# Patient Record
Sex: Female | Born: 1995 | Race: White | Hispanic: No | Marital: Single | Smoking: Former smoker
Health system: Southern US, Community
[De-identification: ages and names within clinical notes are randomized; demographics above are authoritative.]

## PROBLEM LIST (undated history)

## (undated) ENCOUNTER — Inpatient Hospital Stay: Payer: Self-pay

## (undated) DIAGNOSIS — F4323 Adjustment disorder with mixed anxiety and depressed mood: Secondary | ICD-10-CM

## (undated) DIAGNOSIS — Z653 Problems related to other legal circumstances: Secondary | ICD-10-CM

## (undated) DIAGNOSIS — D649 Anemia, unspecified: Secondary | ICD-10-CM

## (undated) DIAGNOSIS — F902 Attention-deficit hyperactivity disorder, combined type: Secondary | ICD-10-CM

## (undated) DIAGNOSIS — F419 Anxiety disorder, unspecified: Secondary | ICD-10-CM

## (undated) DIAGNOSIS — J3089 Other allergic rhinitis: Secondary | ICD-10-CM

## (undated) HISTORY — DX: Adjustment disorder with mixed anxiety and depressed mood: F43.23

## (undated) HISTORY — PX: WISDOM TOOTH EXTRACTION: SHX21

## (undated) HISTORY — DX: Anxiety disorder, unspecified: F41.9

## (undated) HISTORY — DX: Attention-deficit hyperactivity disorder, combined type: F90.2

## (undated) HISTORY — DX: Problems related to other legal circumstances: Z65.3

---
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
## 2017-01-11 NOTE — L&D Delivery Note (Signed)
Delivery Note Primary OB: [facility] Delivery Provider: Marcelyn BruinsJacelyn Yago Ludvigsen, CNM Gestational Age: Full term Antepartum complications: anemia Intrapartum complications: PROM, Meconium   A viable Female was delivered via vertex presentation at 2158.  No nuchal cord was present. Delivery of the shoulders and body followed without difficulty. The infant was placed on the maternal abdomen.  The umbilical cord was doubly clamped and cut. Cord blood was collected. The placenta was delivered spontaneously and was inspected and found to be intact with a three vessel cord. The cervix and vagina were inspected. There were no lacerations. The fundus was firm.  All counts were correct. Baby to special care nursery for observation.  Apgars: 7, 8  Weight: 8 lb 11 oz.   Placenta status: spontaneous and intact. Cord: 3 vessels.  Anesthesia:  epidural Episiotomy:  none Lacerations:  none Suture Repair: none Est. Blood Loss (mL):  500  Mom to postpartum.  Baby to Nursery.  Marcelyn BruinsJacelyn Gerron Guidotti, CNM [facility], [facility] 08/27/2017  10:28 PM

## 2017-01-19 ENCOUNTER — Encounter: Payer: Self-pay | Admitting: Maternal Newborn

## 2017-02-02 ENCOUNTER — Encounter: Payer: Self-pay | Admitting: Obstetrics and Gynecology

## 2017-02-09 ENCOUNTER — Encounter: Payer: Self-pay | Admitting: Advanced Practice Midwife

## 2017-02-09 ENCOUNTER — Ambulatory Visit (INDEPENDENT_AMBULATORY_CARE_PROVIDER_SITE_OTHER): Payer: Medicaid Other | Admitting: Advanced Practice Midwife

## 2017-02-09 VITALS — BP 114/70 | Wt 121.0 lb

## 2017-02-09 DIAGNOSIS — Z3480 Encounter for supervision of other normal pregnancy, unspecified trimester: Secondary | ICD-10-CM

## 2017-02-09 DIAGNOSIS — Z113 Encounter for screening for infections with a predominantly sexual mode of transmission: Secondary | ICD-10-CM

## 2017-02-09 LAB — OB RESULTS CONSOLE VARICELLA ZOSTER ANTIBODY, IGG: Varicella: NON-IMMUNE/NOT IMMUNE

## 2017-02-09 NOTE — Progress Notes (Signed)
NOB today.  

## 2017-02-09 NOTE — Progress Notes (Signed)
New Obstetric Patient H&P    Chief Complaint: "Desires prenatal care"   History of Present Illness: Patient is a 22 y.o. G2P1001 Not Hispanic or Latino female, LMP 11/21/2016 presents with amenorrhea and positive home pregnancy test. Based on her LMP, her EDD is Estimated Date of Delivery: 08/28/2017. and her EGA is [redacted]w[redacted]d. Cycles are 5. days, regular, and occur approximately every : 28 days. She has never had a PAP smear due to age.    She had a urine pregnancy test which was positive 6 week(s)  ago. Her last menstrual period was normal and lasted for  5 day(s). Since her LMP she claims she has experienced breast tenderness, fatigue, nausea, vomiting. She denies vaginal bleeding. Her past medical history is noncontributory. Her prior pregnancies are notable for none  Since her LMP, she admits to the use of tobacco products  Yes: She quit when she found out She claims she has gained   8 pounds since the start of her pregnancy.  There are cats in the home in the home  no  She admits close contact with children on a regular basis  yes  She has had chicken pox in the past yes She has had Tuberculosis exposures, symptoms, or previously tested positive for TB   no Current or past history of domestic violence. no  Genetic Screening/Teratology Counseling: (Includes patient, baby's father, or anyone in either family with:)   1. Patient's age >/= 21 [facility]  no 2. Thalassemia ([place], [place], Mediterranean, or Asian background): MCV<80  no 3. Neural tube defect (meningomyelocele, spina bifida, anencephaly)  no 4. Congenital heart defect  no  5. Down syndrome  no 6. Tay-Sachs (Jewish, [place]  no 7. Canavan's Disease  no 8. Sickle cell disease or trait (African)  no  9. Hemophilia or other blood disorders  no  10. Muscular dystrophy  no  11. Cystic fibrosis  no  12. Huntington's Chorea  no  13. Mental retardation/autism  no 14. Other inherited genetic or chromosomal disorder  no 15.  Maternal metabolic disorder (DM, PKU, etc)  no 16. Patient or FOB with a child with a birth defect not listed above no  16a. Patient or FOB with a birth defect themselves no 17. Recurrent pregnancy loss, or stillbirth  no  18. Any medications since LMP other than prenatal vitamins (include vitamins, supplements, OTC meds, drugs, alcohol)  Flonase, Zyrtec, marijuana. She plans to quit using MJ 19. Any other genetic/environmental exposure to discuss  no  Infection History:   1. Lives with someone with TB or TB exposed  no  2. Patient or partner has history of genital herpes  no 3. Rash or viral illness since LMP  no 4. History of STI (GC, CT, HPV, syphilis, HIV)  no 5. History of recent travel :  no  Other pertinent information:  no     Review of Systems:10 point review of systems negative unless otherwise noted in HPI  Past Medical History:  Past Medical History:  Diagnosis Date  . Anemia   . Environmental and seasonal allergies     Past Surgical History:  Past Surgical History:  Procedure Laterality Date  . WISDOM TOOTH EXTRACTION     age 24 - all four    Gynecologic History: Patient's last menstrual period was 11/21/2016.  Obstetric History: G2P1001  Family History:  Family History  Problem Relation Age of Onset  . Hypertension Maternal Grandmother   . Breast cancer Other   .  Cancer Other 74       lung    Social History:  Social History   Socioeconomic History  . Marital status: Single    Spouse name: Not on file  . Number of children: 1  . Years of education: 27  . Highest education level: Not on file  Social Needs  . Financial resource strain: Not on file  . Food insecurity - worry: Not on file  . Food insecurity - inability: Not on file  . Transportation needs - medical: Not on file  . Transportation needs - non-medical: Not on file  Occupational History  . Occupation: UNEMPLOYED  Tobacco Use  . Smoking status: Former Smoker    Last attempt to  quit: 04/28/2015    Years since quitting: 1.7  . Smokeless tobacco: Never Used  . Tobacco comment: quit with positive pregnancy test  Substance and Sexual Activity  . Alcohol use: Yes    Alcohol/week: 3.6 oz    Types: 6 Shots of liquor per week    Comment: weekends  . Drug use: Yes    Types: Marijuana  . Sexual activity: Yes    Birth control/protection: None  Other Topics Concern  . Not on file  Social History Narrative  . Not on file    Allergies:  Allergies  Allergen Reactions  . Vicodin [Hydrocodone-Acetaminophen] Itching    Medications: Prior to Admission medications   Medication Sig Start Date End Date Taking? Authorizing Provider  fluticasone (FLONASE) 50 MCG/ACT nasal spray Place into the nose. 11/27/14  Yes [provider]  Prenatal Vit-Fe Fumarate-FA (MULTIVITAMIN-PRENATAL) 27-0.8 MG TABS tablet Take 1 tablet by mouth daily at 12 noon.   Yes [provider]  calcium carbonate (TUMS - DOSED IN MG ELEMENTAL CALCIUM) 500 MG chewable tablet Chew 1 tablet by mouth 3 (three) times daily with meals.    [provider]  cetirizine (ZYRTEC) 10 MG tablet Take 10 mg by mouth daily.    [provider]    Physical Exam Vitals: Blood pressure 114/70, weight 121 lb (54.9 kg), last menstrual period 11/21/2016, not currently breastfeeding.  General: NAD HEENT: normocephalic, anicteric Thyroid: no enlargement, no palpable nodules Pulmonary: No increased work of breathing, CTAB Cardiovascular: RRR, distal pulses 2+ Abdomen: NABS, soft, non-tender, non-distended.  Umbilicus without lesions.  No hepatomegaly, splenomegaly or masses palpable. No evidence of hernia, Fetal Heart Tones heard: 170's Genitourinary:  Shared decision making: pelvic exam deferred for no complaints or symptoms, no PAP Extremities: no edema, erythema, or tenderness Neurologic: Grossly intact Psychiatric: mood appropriate, affect full   Assessment: 22 y.o. G2P1001 at [redacted]w[redacted]d  by LMP presenting to initiate prenatal care  Plan: 1) Avoid alcoholic beverages. 2) Patient encouraged not to smoke.  3) Discontinue the use of all non-medicinal drugs and chemicals/marijuana.  4) Take prenatal vitamins daily.  5) Nutrition, food safety (fish, cheese advisories, and high nitrite foods) and exercise discussed. 6) Hospital and practice style discussed with cross coverage system.  7) Genetic Screening, such as with 1st Trimester Screening, cell free fetal DNA, AFP testing, and Ultrasound, as well as with amniocentesis and CVS as appropriate, is discussed with patient. At the conclusion of today's visit patient declined genetic testing 8) Patient is asked about travel to areas at risk for the [place] virus, and counseled to avoid travel and exposure to mosquitoes or sexual partners who may have themselves been exposed to the virus. Testing is discussed, and will be ordered as appropriate.  9) Dating scan  in 1 week  Tresea MallJane Riku Buttery, CNM

## 2017-02-09 NOTE — Patient Instructions (Signed)
Prenatal Care WHAT IS PRENATAL CARE? Prenatal care is the process of caring for a pregnant woman before she gives birth. Prenatal care makes sure that she and her baby remain as healthy as possible throughout pregnancy. Prenatal care may be provided by a midwife, family practice health care provider, or a childbirth and pregnancy specialist (obstetrician). Prenatal care may include physical examinations, testing, treatments, and education on nutrition, lifestyle, and social support services. WHY IS PRENATAL CARE SO IMPORTANT? Early and consistent prenatal care increases the chance that you and your baby will remain healthy throughout your pregnancy. This type of care also decreases a baby's risk of being born too early (prematurely), or being born smaller than expected (small for gestational age). Any underlying medical conditions you may have that could pose a risk during your pregnancy are discussed during prenatal care visits. You will also be monitored regularly for any new conditions that may arise during your pregnancy so they can be treated quickly and effectively. WHAT HAPPENS DURING PRENATAL CARE VISITS? Prenatal care visits may include the following: Discussion Tell your health care provider about any new signs or symptoms you have experienced since your last visit. These might include:  Nausea or vomiting.  Increased or decreased level of energy.  Difficulty sleeping.  Back or leg pain.  Weight changes.  Frequent urination.  Shortness of breath with physical activity.  Changes in your skin, such as the development of a rash or itchiness.  Vaginal discharge or bleeding.  Feelings of excitement or nervousness.  Changes in your baby's movements.  You may want to write down any questions or topics you want to discuss with your health care provider and bring them with you to your appointment. Examination During your first prenatal care visit, you will likely have a complete  physical exam. Your health care provider will often examine your vagina, cervix, and the position of your uterus, as well as check your heart, lungs, and other body systems. As your pregnancy progresses, your health care provider will measure the size of your uterus and your baby's position inside your uterus. He or she may also examine you for early signs of labor. Your prenatal visits may also include checking your blood pressure and, after about 10-12 weeks of pregnancy, listening to your baby's heartbeat. Testing Regular testing often includes:  Urinalysis. This checks your urine for glucose, protein, or signs of infection.  Blood count. This checks the levels of white and red blood cells in your body.  Tests for sexually transmitted infections (STIs). Testing for STIs at the beginning of pregnancy is routinely done and is required in many states.  Antibody testing. You will be checked to see if you are immune to certain illnesses, such as rubella, that can affect a developing fetus.  Glucose screen. Around 24-28 weeks of pregnancy, your blood glucose level will be checked for signs of gestational diabetes. Follow-up tests may be recommended.  Group B strep. This is a bacteria that is commonly found inside a woman's vagina. This test will inform your health care provider if you need an antibiotic to reduce the amount of this bacteria in your body prior to labor and childbirth.  Ultrasound. Many pregnant women undergo an ultrasound screening around 18-20 weeks of pregnancy to evaluate the health of the fetus and check for any developmental abnormalities.  HIV (human immunodeficiency virus) testing. Early in your pregnancy, you will be screened for HIV. If you are at high risk for HIV, this test may   be repeated during your third trimester of pregnancy.  You may be offered other testing based on your age, personal or family medical history, or other factors. HOW OFTEN SHOULD I PLAN TO SEE MY  HEALTH CARE PROVIDER FOR PRENATAL CARE? Your prenatal care check-up schedule depends on any medical conditions you have before, or develop during, your pregnancy. If you do not have any underlying medical conditions, you will likely be seen for checkups:  Monthly, during the first 6 months of pregnancy.  Twice a month during months 7 and 8 of pregnancy.  Weekly starting in the 9th month of pregnancy and until delivery.  If you develop signs of early labor or other concerning signs or symptoms, you may need to see your health care provider more often. Ask your health care provider what prenatal care schedule is best for you. WHAT CAN I DO TO KEEP MYSELF AND MY BABY AS HEALTHY AS POSSIBLE DURING MY PREGNANCY?  Take a prenatal vitamin containing 400 micrograms (0.4 mg) of folic acid every day. Your health care provider may also ask you to take additional vitamins such as iodine, vitamin D, iron, copper, and zinc.  Take 1500-2000 mg of calcium daily starting at your 20th week of pregnancy until you deliver your baby.  Make sure you are up to date on your vaccinations. Unless directed otherwise by your health care provider: ? You should receive a tetanus, diphtheria, and pertussis (Tdap) vaccination between the 27th and 36th week of your pregnancy, regardless of when your last Tdap immunization occurred. This helps protect your baby from whooping cough (pertussis) after he or she is born. ? You should receive an annual inactivated influenza vaccine (IIV) to help protect you and your baby from influenza. This can be done at any point during your pregnancy.  Eat a well-rounded diet that includes: ? Fresh fruits and vegetables. ? Lean proteins. ? Calcium-rich foods such as milk, yogurt, hard cheeses, and dark, leafy greens. ? Whole grain breads.  Do noteat seafood high in mercury, including: ? Swordfish. ? Tilefish. ? Shark. ? King mackerel. ? More than 6 oz tuna per week.  Do not  eat: ? Raw or undercooked meats or eggs. ? Unpasteurized foods, such as soft cheeses (brie, blue, or feta), juices, and milks. ? Lunch meats. ? Hot dogs that have not been heated until they are steaming.  Drink enough water to keep your urine clear or pale yellow. For many women, this may be 10 or more 8 oz glasses of water each day. Keeping yourself hydrated helps deliver nutrients to your baby and may prevent the start of pre-term uterine contractions.  Do not use any tobacco products including cigarettes, chewing tobacco, or electronic cigarettes. If you need help quitting, ask your health care provider.  Do not drink beverages containing alcohol. No safe level of alcohol consumption during pregnancy has been determined.  Do not use any illegal drugs. These can harm your developing baby or cause a miscarriage.  Ask your health care provider or pharmacist before taking any prescription or over-the-counter medicines, herbs, or supplements.  Limit your caffeine intake to no more than 200 mg per day.  Exercise. Unless told otherwise by your health care provider, try to get 30 minutes of moderate exercise most days of the week. Do not  do high-impact activities, contact sports, or activities with a high risk of falling, such as horseback riding or downhill skiing.  Get plenty of rest.  Avoid anything that raises your   body temperature, such as hot tubs and saunas.  If you own a cat, do not empty its litter box. Bacteria contained in cat feces can cause an infection called toxoplasmosis. This can result in serious harm to the fetus.  Stay away from chemicals such as insecticides, lead, mercury, and cleaning or paint products that contain solvents.  Do not have any X-rays taken unless medically necessary.  Take a childbirth and breastfeeding preparation class. Ask your health care provider if you need a referral or recommendation.  This information is not intended to replace advice given  to you by your health care provider. Make sure you discuss any questions you have with your health care provider. Document Released: 12/31/2002 Document Revised: 06/02/2015 Document Reviewed: 03/14/2013 Elsevier Interactive Patient Education  2017 [employer] Exercise During Pregnancy For people of all ages, exercise is an important part of being healthy. Exercise improves heart and lung function and helps to maintain strength, flexibility, and a healthy body weight. Exercise also boosts energy levels and elevates mood. For most women, maintaining an exercise routine throughout pregnancy is recommended. It is only on rare occasions and with certain medical conditions or pregnancy complications that women may be asked to limit or avoid exercise during pregnancy. What are some other benefits to exercising during pregnancy? Along with maintaining strength and flexibility, exercising throughout pregnancy can help to:  Keep strength in muscles that are very important during labor and childbirth.  Decrease low back pain during pregnancy.  Decrease the risk of developing gestational diabetes mellitus (GDM).  Improve blood sugar (glucose) control for women who have GDM.  Decrease the risk of developing preeclampsia. This is a serious condition that causes high blood pressure along with other symptoms, such as swelling and headaches.  Decrease the risk of cesarean delivery.  Speed up the recovery after giving birth.  How often should I exercise? Unless your health care provider gives you different instructions, you should try to exercise on most days or all days of the week. In general, try to exercise with moderate intensity for about 150 minutes per week. This can be spread out across several days, such as exercising for 30 minutes per day on 5 days of each week. You can tell that you are exercising at a moderate intensity if you have a higher heart rate and faster breathing, but you are still  able to hold a conversation. What types of moderate-intensity exercise are recommended during pregnancy? There are many types of exercise that are safe for you to do during pregnancy. Unless your health care provider gives you different instructions, do a variety of exercises that safely increase your heart and breathing (cardiopulmonary) rates and help you to build and maintain muscle strength (strength training). You should always be able to talk in full sentences while exercising during pregnancy. Some examples of exercising that is safe to do during pregnancy include:  Brisk walking or hiking.  Swimming.  Water aerobics.  Riding a stationary bike.  Strength training.  Modified yoga or Pilates. Tell your instructor that you are pregnant. Avoid overstretching and avoid lying on your back for long periods of time.  Running or jogging. Only choose this type of exercise if: ? You ran or jogged regularly before your pregnancy. ? You can run or jog and still talk in complete sentences.  What types of exercise should I not do during pregnancy? Depending on your level of fitness and whether you exercised regularly before your pregnancy, you may be   advised to limit vigorous-intensity exercise during your pregnancy. You can tell that you are exercising at a vigorous intensity if you are breathing much harder and faster and cannot hold a conversation while exercising. Some examples of exercising that you should avoid during pregnancy include:  Contact sports.  Activities that place you at risk for falling on or being hit in the belly, such as downhill skiing, water skiing, surfing, rock climbing, cycling, gymnastics, and horseback riding.  Scuba diving.  Sky diving.  Yoga or Pilates in a room that is heated to extreme temperatures ("hot yoga" or "hot Pilates").  Jogging or running, unless you ran or jogged regularly before your pregnancy. While jogging or running, you should always be able  to talk in full sentences. Do not run or jog so vigorously that you are unable to have a conversation.  If you are not used to exercising at elevation (more than 6,000 feet above sea level), do not do so during your pregnancy.  When should I avoid exercising during pregnancy? Certain medical conditions can make it unsafe to exercise during pregnancy, or they may increase your risk of miscarriage or early labor and birth. Some of these conditions include:  Some types of heart disease.  Some types of lung disease.  Placenta previa. This is when the placenta partially or completely covers the opening of the uterus (cervix).  Frequent bleeding from the vagina during your pregnancy.  Incompetent cervix. This is when your cervix does not remain as tightly closed during pregnancy as it should.  Premature labor.  Ruptured membranes. This is when the protective sac (amniotic sac) opens up and amniotic fluid leaks from your vagina.  Severely low blood count (anemia).  Preeclampsia or pregnancy-caused high blood pressure.  Carrying more than one baby (multiple gestation) and having an additional risk of early labor.  Poorly controlled diabetes.  Being severely underweight or severely overweight.  Intrauterine growth restriction. This is when your baby's growth and development during pregnancy are slower than expected.  Other medical conditions. Ask your health care provider if any apply to you.  What else should I know about exercising during pregnancy? You should take these precautions while exercising during pregnancy:  Avoid overheating. ? Wear loose-fitting, breathable clothes. ? Do not exercise in very high temperatures.  Avoid dehydration. Drink enough water before, during, and after exercise to keep your urine clear or pale yellow.  Avoid overstretching. Because of hormone changes during pregnancy, it is easy to overstretch muscles, tendons, and ligaments during  pregnancy.  Start slowly and ask your health care provider to recommend types of exercise that are safe for you, if exercising regularly is new for you.  Pregnancy is not a time for exercising to lose weight. When should I seek medical care? You should stop exercising and call your health care provider if you have any unusual symptoms, such as:  Mild uterine contractions or abdominal cramping.  Dizziness that does not improve with rest.  When should I seek immediate medical care? You should stop exercising and call your local emergency services (911 in the [place] if you have any unusual symptoms, such as:  Sudden, severe pain in your low back or your belly.  Uterine contractions or abdominal cramping that do not improve with rest.  Chest pain.  Bleeding or fluid leaking from your vagina.  Shortness of breath.  This information is not intended to replace advice given to you by your health care provider. Make sure you discuss any   questions you have with your health care provider. Document Released: 12/28/2004 Document Revised: 05/28/2015 Document Reviewed: 03/07/2014 Elsevier Interactive Patient Education  2018 [employer] Eating Plan for Pregnant Women While you are pregnant, your body will require additional nutrition to help support your growing baby. It is recommended that you consume:  150 additional calories each day during your first trimester.  300 additional calories each day during your second trimester.  300 additional calories each day during your third trimester.  Eating a healthy, well-balanced diet is very important for your health and for your baby's health. You also have a higher need for some vitamins and minerals, such as folic acid, calcium, iron, and vitamin D. What do I need to know about eating during pregnancy?  Do not try to lose weight or go on a diet during pregnancy.  Choose healthy, nutritious foods. Choose  of a sandwich with a glass of milk  instead of a candy bar or a high-calorie sugar-sweetened beverage.  Limit your overall intake of foods that have "empty calories." These are foods that have little nutritional value, such as sweets, desserts, candies, sugar-sweetened beverages, and fried foods.  Eat a variety of foods, especially fruits and vegetables.  Take a prenatal vitamin to help meet the additional needs during pregnancy, specifically for folic acid, iron, calcium, and vitamin D.  Remember to stay active. Ask your health care provider for exercise recommendations that are specific to you.  Practice good food safety and cleanliness, such as washing your hands before you eat and after you prepare raw meat. This helps to prevent foodborne illnesses, such as listeriosis, that can be very dangerous for your baby. Ask your health care provider for more information about listeriosis. What does 150 extra calories look like? Healthy options for an additional 150 calories each day could be any of the following:  Plain low-fat yogurt (6-8 oz) with  cup of berries.  1 apple with 2 teaspoons of peanut butter.  Cut-up vegetables with  cup of hummus.  Low-fat chocolate milk (8 oz or 1 cup).  1 string cheese with 1 medium orange.   of a peanut butter and jelly sandwich on whole-wheat bread (1 tsp of peanut butter).  For 300 calories, you could eat two of those healthy options each day. What is a healthy amount of weight to gain? The recommended amount of weight for you to gain is based on your pre-pregnancy BMI. If your pre-pregnancy BMI was:  Less than 18 (underweight), you should gain 28-40 lb.  18-24.9 (normal), you should gain 25-35 lb.  25-29.9 (overweight), you should gain 15-25 lb.  Greater than 30 (obese), you should gain 11-20 lb.  What if I am having twins or multiples? Generally, pregnant women who will be having twins or multiples may need to increase their daily calories by 300-600 calories each day. The  recommended range for total weight gain is 25-54 lb, depending on your pre-pregnancy BMI. Talk with your health care provider for specific guidance about additional nutritional needs, weight gain, and exercise during your pregnancy. What foods can I eat? Grains Any grains. Try to choose whole grains, such as whole-wheat bread, oatmeal, or brown rice. Vegetables Any vegetables. Try to eat a variety of colors and types of vegetables to get a full range of vitamins and minerals. Remember to wash your vegetables well before eating. Fruits Any fruits. Try to eat a variety of colors and types of fruit to get a full range of vitamins and   minerals. Remember to wash your fruits well before eating. Meats and Other Protein Sources Lean meats, including chicken, turkey, fish, and lean cuts of beef, veal, or pork. Make sure that all meats are cooked to "well done." Tofu. Tempeh. Beans. Eggs. Peanut butter and other nut butters. Seafood, such as shrimp, crab, and lobster. If you choose fish, select types that are higher in omega-3 fatty acids, including salmon, herring, mussels, trout, sardines, and pollock. Make sure that all meats are cooked to food-safe temperatures. Dairy Pasteurized milk and milk alternatives. Pasteurized yogurt and pasteurized cheese. Cottage cheese. Sour cream. Beverages Water. Juices that contain 100% fruit juice or vegetable juice. Caffeine-free teas and decaffeinated coffee. Drinks that contain caffeine are okay to drink, but it is better to avoid caffeine. Keep your total caffeine intake to less than 200 mg each day (12 oz of coffee, tea, or soda) or as directed by your health care provider. Condiments Any pasteurized condiments. Sweets and Desserts Any sweets and desserts. Fats and Oils Any fats and oils. The items listed above may not be a complete list of recommended foods or beverages. Contact your dietitian for more options. What foods are not  recommended? Vegetables Unpasteurized (raw) vegetable juices. Fruits Unpasteurized (raw) fruit juices. Meats and Other Protein Sources Cured meats that have nitrates, such as bacon, salami, and hotdogs. Luncheon meats, bologna, or other deli meats (unless they are reheated until they are steaming hot). Refrigerated pate, meat spreads from a meat counter, smoked seafood that is found in the refrigerated section of a store. Raw fish, such as sushi or sashimi. High mercury content fish, such as tilefish, shark, swordfish, and king mackerel. Raw meats, such as tuna or beef tartare. Undercooked meats and poultry. Make sure that all meats are cooked to food-safe temperatures. Dairy Unpasteurized (raw) milk and any foods that have raw milk in them. Soft cheeses, such as feta, queso blanco, queso fresco, Brie, Camembert cheeses, blue-veined cheeses, and Panela cheese (unless it is made with pasteurized milk, which must be stated on the label). Beverages Alcohol. Sugar-sweetened beverages, such as sodas, teas, or energy drinks. Condiments Homemade fermented foods and drinks, such as pickles, sauerkraut, or kombucha drinks. (Store-bought pasteurized versions of these are okay.) Other Salads that are made in the store, such as ham salad, chicken salad, egg salad, tuna salad, and seafood salad. The items listed above may not be a complete list of foods and beverages to avoid. Contact your dietitian for more information. This information is not intended to replace advice given to you by your health care provider. Make sure you discuss any questions you have with your health care provider. Document Released: 10/12/2013 Document Revised: 06/05/2015 Document Reviewed: 06/12/2013 Elsevier Interactive Patient Education  2018 [employer]  

## 2017-02-11 LAB — RPR+RH+ABO+RUB AB+AB SCR+CB...
Antibody Screen: NEGATIVE
HEMOGLOBIN: 11 g/dL — AB (ref 11.1–15.9)
HEP B S AG: NEGATIVE
HIV SCREEN 4TH GENERATION: NONREACTIVE
Hematocrit: 32.6 % — ABNORMAL LOW (ref 34.0–46.6)
MCH: 29.6 pg (ref 26.6–33.0)
MCHC: 33.7 g/dL (ref 31.5–35.7)
MCV: 88 fL (ref 79–97)
Platelets: 266 10*3/uL (ref 150–379)
RBC: 3.72 x10E6/uL — AB (ref 3.77–5.28)
RDW: 13.2 % (ref 12.3–15.4)
RPR Ser Ql: NONREACTIVE
RUBELLA: 0.95 {index} — AB (ref >0.99)
Rh Factor: NEGATIVE
WBC: 13.8 10*3/uL — ABNORMAL HIGH (ref 3.4–10.8)

## 2017-02-11 LAB — CHLAMYDIA/GONOCOCCUS/TRICHOMONAS, NAA
Chlamydia by NAA: NEGATIVE
Gonococcus by NAA: NEGATIVE
TRICH VAG BY NAA: NEGATIVE

## 2017-02-11 LAB — URINE CULTURE

## 2017-02-14 LAB — URINE DRUG PANEL 7
AMPHETAMINES, URINE: NEGATIVE ng/mL
BARBITURATE QUANT UR: NEGATIVE ng/mL
Benzodiazepine Quant, Ur: NEGATIVE ng/mL
CANNABINOID QUANT UR: POSITIVE — AB
COCAINE (METAB.): NEGATIVE ng/mL
Opiate Quant, Ur: NEGATIVE ng/mL
PCP Quant, Ur: NEGATIVE ng/mL

## 2017-02-17 ENCOUNTER — Ambulatory Visit (INDEPENDENT_AMBULATORY_CARE_PROVIDER_SITE_OTHER): Payer: Medicaid Other

## 2017-02-17 ENCOUNTER — Encounter: Payer: Self-pay | Admitting: Maternal Newborn

## 2017-02-17 ENCOUNTER — Ambulatory Visit (INDEPENDENT_AMBULATORY_CARE_PROVIDER_SITE_OTHER): Payer: Medicaid Other | Admitting: Maternal Newborn

## 2017-02-17 VITALS — BP 90/60 | Wt 116.0 lb

## 2017-02-17 DIAGNOSIS — Z3480 Encounter for supervision of other normal pregnancy, unspecified trimester: Secondary | ICD-10-CM

## 2017-02-17 DIAGNOSIS — Z3A12 12 weeks gestation of pregnancy: Secondary | ICD-10-CM

## 2017-02-17 NOTE — Progress Notes (Signed)
    Routine Prenatal Care Visit  Subjective  Jennifer [name] is a 22 y.o. G2P1001 at 4846w4d being seen today for ongoing prenatal care.  She is currently monitored for the following issues for this low-risk pregnancy and has Supervision of other normal pregnancy, antepartum; ADHD (attention deficit hyperactivity disorder); and Allergic rhinitis on their problem list.  ----------------------------------------------------------------------------------- Patient reports no complaints.   Vag. Bleeding: None.   ----------------------------------------------------------------------------------- The following portions of the patient's history were reviewed and updated as appropriate: allergies, current medications, past family history, past medical history, past social history, past surgical history and problem list. Problem list updated.   Objective  Blood pressure 90/60, weight 116 lb (52.6 kg), last menstrual period 11/21/2016, not currently breastfeeding. Pregravid weight 113 lb (51.3 kg) Total Weight Gain 3 lb (1.361 kg) Urinalysis: Urine Protein: Negative Urine Glucose: Negative  Fetal Status: Fetal Heart Rate (bpm): 157         General:  Alert, oriented and cooperative. Patient is in no acute distress.  Skin: Skin is warm and dry. No rash noted.   Cardiovascular: Normal heart rate noted  Respiratory: Normal respiratory effort, no problems with respiration noted  Abdomen: Soft, gravid, appropriate for gestational age. Pain/Pressure: Absent     Pelvic:  Cervical exam deferred        Extremities: Normal range of motion.     Mental Status: Normal mood and affect. Normal behavior. Normal judgment and thought content.     Assessment   22 y.o. G2P1001 at 4946w4d, EDD 08/28/2017 by Last Menstrual Period presenting for routine prenatal visit.  Plan   pregnancy Problems (from 02/09/17 to present)    Problem Noted Resolved   Supervision of other normal pregnancy, antepartum 02/09/2017 by  Tresea MallGledhill, Jennifer, CNM No   Overview Addendum 02/09/2017 12:57 PM by Tresea MallGledhill, Jennifer, CNM    [facility] Prenatal Labs  Dating  Blood type:     Genetic Screen 1 Screen:    AFP:     Quad:     NIPS: Antibody:   Anatomic US  Rubella:   Varicella: @VZVIGG @  GTT Early:               Third trimester:  RPR:     Rhogam  HBsAg:     TDaP vaccine                       Flu Shot: HIV:     Baby Food                                GBS:   Contraception  Pap:  CBB     CS/VBAC NA   Support Person [name]            Ultrasound today shows single IUP with cardiac activity, GA within 5 days of EDD by LMP, no adjustment in EDD.    Preterm labor symptoms and general obstetric precautions including but not limited to vaginal bleeding, contractions, and leaking of fluid were reviewed in detail with the patient.  Samples given of prenatal vitamins as she is almost out, will Rx but advised her that she can also use OTC vitamins as long as they contain folate and iron.  Return in about 4 weeks (around 03/17/2017) for ROB.  Jennifer [name], CNM 02/17/2017  3:30 PM

## 2017-02-17 NOTE — Progress Notes (Signed)
C/o test results from last visit, rx [email or web address]

## 2017-02-18 MED ORDER — ONE-A-DAY WOMENS PRENATAL 1 28-0.8-235 MG PO CAPS: 1.0000 | ORAL_CAPSULE | Freq: Every day | ORAL | 6 refills | Status: DC

## 2017-03-10 ENCOUNTER — Telehealth: Payer: Self-pay | Admitting: Obstetrics and Gynecology

## 2017-03-10 NOTE — Telephone Encounter (Signed)
If this patient's bleeding was more than just spotting on tissue she needs to be seen due to she is RH negative. Please check to see if [place] BingJaci can see her this afternoon. Thank you.

## 2017-03-10 NOTE — Telephone Encounter (Signed)
Called and spoke with Patient to follow up on bleeding complaint. Pt reports no bleeding at this time since her last episode. Per JCS to have patient to come in to be seen if patient feels the needs to be seen or schedule for Nurse visit for Rhogam  Injection. Pt was advised of scheduling appointment chooses to come in for injection advise by provider. Patient also advise to call back to be seen if her symptoms become worse.

## 2017-03-10 NOTE — Telephone Encounter (Signed)
Pt is [redacted] weeks pregnant and is having bright red bleeding when she went to restroom. Pt is stating she isn't having bleeding at this time. Please advise overbooking

## 2017-03-11 ENCOUNTER — Ambulatory Visit (INDEPENDENT_AMBULATORY_CARE_PROVIDER_SITE_OTHER): Payer: Medicaid Other

## 2017-03-11 DIAGNOSIS — O4692 Antepartum hemorrhage, unspecified, second trimester: Secondary | ICD-10-CM

## 2017-03-11 MED ORDER — RHO D IMMUNE GLOBULIN 1500 UNIT/2ML IJ SOSY
300.0000 ug | PREFILLED_SYRINGE | Freq: Once | INTRAMUSCULAR | Status: AC
  Administered 2017-03-11: 300 ug via INTRAMUSCULAR

## 2017-03-11 NOTE — Progress Notes (Signed)
Pt here for rhophylac inj which was given IM right glut.  NDC# [id number]

## 2017-03-16 ENCOUNTER — Encounter: Payer: Medicaid Other | Admitting: Certified Nurse Midwife

## 2017-03-18 ENCOUNTER — Ambulatory Visit (INDEPENDENT_AMBULATORY_CARE_PROVIDER_SITE_OTHER): Payer: Medicaid Other | Admitting: Certified Nurse Midwife

## 2017-03-18 ENCOUNTER — Encounter: Payer: Self-pay | Admitting: Certified Nurse Midwife

## 2017-03-18 VITALS — BP 90/60 | Wt 120.0 lb

## 2017-03-18 DIAGNOSIS — F1290 Cannabis use, unspecified, uncomplicated: Secondary | ICD-10-CM | POA: Insufficient documentation

## 2017-03-18 DIAGNOSIS — Z3480 Encounter for supervision of other normal pregnancy, unspecified trimester: Secondary | ICD-10-CM

## 2017-03-18 DIAGNOSIS — Z3A16 16 weeks gestation of pregnancy: Secondary | ICD-10-CM

## 2017-03-18 NOTE — Progress Notes (Signed)
Pt c/o bad headaches almost every day, waking up daily with headaches behind her eyes. Also c/o seasonal allergies and congestion

## 2017-03-18 NOTE — Progress Notes (Signed)
ROB at 16wk5d: Jennifer BabinskiMarilyn in to see patient regarding a class she needs to attend regarding drug abuse (charged with a misdemeanor for MJ) FOB not very supportive, causing more stress. EPDS score=10. Recommended returning for counseling with Jennifer FolksAmanda at the [facility]. Jennifer RossettiSaw Jennifer [name] with her last pregnancy regarding grieving after the death of the FOB. Feeling some flutters. FH at U-1FB. FHTs WNL Offered quad screen-she is undecided. Advised can have done between 15-21 weeks. Can call if she decided to have it done ROB in 2-3 weeks for anatomy scan

## 2017-04-08 ENCOUNTER — Encounter: Payer: Medicaid Other | Admitting: Obstetrics and Gynecology

## 2017-04-08 ENCOUNTER — Encounter: Payer: Self-pay | Admitting: Obstetrics and Gynecology

## 2017-04-08 ENCOUNTER — Ambulatory Visit (INDEPENDENT_AMBULATORY_CARE_PROVIDER_SITE_OTHER): Payer: Medicaid Other | Admitting: Obstetrics and Gynecology

## 2017-04-08 ENCOUNTER — Ambulatory Visit (INDEPENDENT_AMBULATORY_CARE_PROVIDER_SITE_OTHER): Payer: Medicaid Other

## 2017-04-08 ENCOUNTER — Other Ambulatory Visit: Payer: Medicaid Other

## 2017-04-08 VITALS — BP 102/64 | Wt 126.0 lb

## 2017-04-08 DIAGNOSIS — Z3480 Encounter for supervision of other normal pregnancy, unspecified trimester: Secondary | ICD-10-CM

## 2017-04-08 DIAGNOSIS — F1290 Cannabis use, unspecified, uncomplicated: Secondary | ICD-10-CM

## 2017-04-08 DIAGNOSIS — F909 Attention-deficit hyperactivity disorder, unspecified type: Secondary | ICD-10-CM

## 2017-04-08 DIAGNOSIS — Z3A19 19 weeks gestation of pregnancy: Secondary | ICD-10-CM

## 2017-04-08 NOTE — Progress Notes (Incomplete)
  Routine Prenatal Care Visit  Subjective  Jennifer [name] is a 22 y.o. G2P1001 at 4653w5d being seen today for ongoing prenatal care.  She is currently monitored for the following issues for this {Blank single:19197::"high-risk","low-risk"} pregnancy and has Supervision of other normal pregnancy, antepartum; ADHD (attention deficit hyperactivity disorder); Allergic rhinitis; and Marijuana use on their problem list.  ----------------------------------------------------------------------------------- Patient reports {sx:14538}.   Contractions: Not present. Vag. Bleeding: None.  Movement: Present. Denies leaking of fluid.  ----------------------------------------------------------------------------------- The following portions of the patient's history were reviewed and updated as appropriate: allergies, current medications, past family history, past medical history, past social history, past surgical history and problem list. Problem list updated.   Objective  Blood pressure 102/64, weight 126 lb (57.2 kg), last menstrual period 11/21/2016, not currently breastfeeding. Pregravid weight 113 lb (51.3 kg) Total Weight Gain 13 lb (5.897 kg) Urinalysis: Urine Protein: Negative Urine Glucose: Negative  Fetal Status: Fetal Heart Rate (bpm): Present   Movement: Present     General:  Alert, oriented and cooperative. Patient is in no acute distress.  Skin: Skin is warm and dry. No rash noted.   Cardiovascular: Normal heart rate noted  Respiratory: Normal respiratory effort, no problems with respiration noted  Abdomen: Soft, gravid, appropriate for gestational age. Pain/Pressure: Absent     Pelvic:  {Blank single:19197::"Cervical exam performed","Cervical exam deferred"}        Extremities: Normal range of motion.     Mental Status: Normal mood and affect. Normal behavior. Normal judgment and thought content.   Assessment   22 y.o. G2P1001 at 10453w5d by  08/28/2017, by Last Menstrual Period presenting  for {Blank single:19197::"routine","work-in"} prenatal visit  Plan   pregnancy Problems (from 02/09/17 to present)    Problem Noted Resolved   Supervision of other normal pregnancy, antepartum 02/09/2017 by Tresea MallGledhill, Jane, CNM No   Overview Addendum 02/17/2017  3:30 PM by Oswaldo ConroySchmid, Jacelyn Y, CNM    Clinic [facility] Prenatal Labs  Dating  Blood type: B/Negative/-- (01/30 1131)   Genetic Screen 1 Screen:    AFP:     Quad:     NIPS: Antibody:Negative (01/30 1131)  Anatomic US  Rubella: 0.95 (01/30 1131) Varicella: Non-immune  GTT Early:               Third trimester:  RPR: Non Reactive (01/30 1131)   Rhogam  HBsAg: Negative (01/30 1131)   TDaP vaccine                       Flu Shot: HIV: Non Reactive (01/30 1131)   Baby Food                                GBS:   Contraception  Pap:  CBB     CS/VBAC NA   Support Person Boyfriend [name]               {Blank single:19197::"Term","Preterm"} labor symptoms and general obstetric precautions including but not limited to vaginal bleeding, contractions, leaking of fluid and fetal movement were reviewed in detail with the patient. Please refer to After Visit Summary for other counseling recommendations.   Return in about 1 month (around 05/06/2017) for Routine Prenatal Appointment.  Thomasene MohairStephen Aylani Spurlock, MD, Merlinda FrederickFACOG [facility], [facility] 04/08/2017 4:25 PM  Anatomy scan today. No vb no lof.

## 2017-04-08 NOTE — Progress Notes (Incomplete)
  Routine Prenatal Care Visit  Subjective  Jennifer [name] is a 22 y.o. G2P1001 at 4380w5d being seen today for ongoing prenatal care.  She is currently monitored for the following issues for this {Blank single:19197::"high-risk","low-risk"} pregnancy and has Supervision of other normal pregnancy, antepartum; ADHD (attention deficit hyperactivity disorder); Allergic rhinitis; and Marijuana use on their problem list.  ----------------------------------------------------------------------------------- Patient reports {sx:14538}.   Contractions: Not present. Vag. Bleeding: None.  Movement: Present. Denies leaking of fluid.  ----------------------------------------------------------------------------------- The following portions of the patient's history were reviewed and updated as appropriate: allergies, current medications, past family history, past medical history, past social history, past surgical history and problem list. Problem list updated.   Objective  Blood pressure 102/64, weight 126 lb (57.2 kg), last menstrual period 11/21/2016, not currently breastfeeding. Pregravid weight 113 lb (51.3 kg) Total Weight Gain 13 lb (5.897 kg) Urinalysis: Urine Protein: Negative Urine Glucose: Negative  Fetal Status: Fetal Heart Rate (bpm): Present   Movement: Present     General:  Alert, oriented and cooperative. Patient is in no acute distress.  Skin: Skin is warm and dry. No rash noted.   Cardiovascular: Normal heart rate noted  Respiratory: Normal respiratory effort, no problems with respiration noted  Abdomen: Soft, gravid, appropriate for gestational age. Pain/Pressure: Absent     Pelvic:  {Blank single:19197::"Cervical exam performed","Cervical exam deferred"}        Extremities: Normal range of motion.     Mental Status: Normal mood and affect. Normal behavior. Normal judgment and thought content.   Assessment   22 y.o. G2P1001 at 3280w5d by  08/28/2017, by Last Menstrual Period presenting  for {Blank single:19197::"routine","work-in"} prenatal visit  Plan   pregnancy Problems (from 02/09/17 to present)    Problem Noted Resolved   Supervision of other normal pregnancy, antepartum 02/09/2017 by Tresea MallGledhill, Jane, CNM No   Overview Addendum 02/17/2017  3:30 PM by Oswaldo ConroySchmid, Jacelyn Y, CNM    Clinic [facility] Prenatal Labs  Dating  Blood type: B/Negative/-- (01/30 1131)   Genetic Screen 1 Screen:    AFP:     Quad:     NIPS: Antibody:Negative (01/30 1131)  Anatomic US  Rubella: 0.95 (01/30 1131) Varicella: Non-immune  GTT Early:               Third trimester:  RPR: Non Reactive (01/30 1131)   Rhogam  HBsAg: Negative (01/30 1131)   TDaP vaccine                       Flu Shot: HIV: Non Reactive (01/30 1131)   Baby Food                                GBS:   Contraception  Pap:  CBB     CS/VBAC NA   Support Person Boyfriend [name]               {Blank single:19197::"Term","Preterm"} labor symptoms and general obstetric precautions including but not limited to vaginal bleeding, contractions, leaking of fluid and fetal movement were reviewed in detail with the patient. Please refer to After Visit Summary for other counseling recommendations.   Return in about 1 month (around 05/06/2017) for Routine Prenatal Appointment.  Thomasene MohairStephen Vallorie Niccoli, MD, Merlinda FrederickFACOG [facility], [facility] 04/08/2017 4:25 PM

## 2017-04-08 NOTE — Progress Notes (Signed)
Routine Prenatal Care Visit  Subjective  Jennifer [name] is a 22 y.o. G2P1001 at [redacted]w[redacted]d being seen today for ongoing prenatal care.  She is currently monitored for the following issues for this high-risk pregnancy and has Supervision of other normal pregnancy, antepartum; ADHD (attention deficit hyperactivity disorder); Allergic rhinitis; and Marijuana use on their problem list.  ----------------------------------------------------------------------------------- Patient reports no complaints.   Contractions: Not present. Vag. Bleeding: None.  Movement: Present. Denies leaking of fluid.  U/S anatomy complete.  ----------------------------------------------------------------------------------- The following portions of the patient's history were reviewed and updated as appropriate: allergies, current medications, past family history, past medical history, past social history, past surgical history and problem list. Problem list updated.   Objective  Blood pressure 102/64, weight 126 lb (57.2 kg), last menstrual period 11/21/2016, not currently breastfeeding. Pregravid weight 113 lb (51.3 kg) Total Weight Gain 13 lb (5.897 kg) Urinalysis: Urine Protein: Negative Urine Glucose: Negative  Fetal Status: Fetal Heart Rate (bpm): Present   Movement: Present     General:  Alert, oriented and cooperative. Patient is in no acute distress.  Skin: Skin is warm and dry. No rash noted.   Cardiovascular: Normal heart rate noted  Respiratory: Normal respiratory effort, no problems with respiration noted  Abdomen: Soft, gravid, appropriate for gestational age. Pain/Pressure: Absent     Pelvic:  Cervical exam deferred        Extremities: Normal range of motion.     Mental Status: Normal mood and affect. Normal behavior. Normal judgment and thought content.   U/S report from my review of the images today: US Ob Comp + 14 Wk  Result Date: 04/08/2017 ULTRASOUND REPORT Location: [facility] Date of  Service: 04/08/2017 Indications:Anatomy U/S Findings: Mason Jim intrauterine pregnancy is visualized with FHR at 149 BPM. Biometrics give an (U/S) Gestational age of [redacted]w[redacted]d and an (U/S) EDD of 08/28/2017; this correlates with the clinically established Estimated Date of Delivery: 08/28/17 Fetal presentation is Cephalic. EFW: 11 oz, 304 g. Placenta: Posterior, Grade 1. AFI: Subjectively normal. Anatomic survey is complete and normal; Gender - surprise.  Right adnexa normal in appearance. Left adnexa normal in appearance. Survey of the adnexa demonstrates no adnexal masses. There is no free peritoneal fluid in the cul de sac. Impression: 1. [redacted]w[redacted]d Viable Singleton Intrauterine pregnancy by U/S. 2. (U/S) EDD is consistent with Clinically established Estimated Date of Delivery: 08/28/17 . 3. Normal Anatomy Scan Recommendations: 1.Clinical correlation with the patient's History and Physical Exam. Mital bahen P Patel, RDMS There is a singleton gestation with subjectively normal amniotic fluid volume. The fetal biometry correlates with established dating. Detailed evaluation of the fetal anatomy was performed.The fetal anatomical survey appears within normal limits within the resolution of ultrasound as described above.  It must be noted that a normal ultrasound is unable to rule out fetal aneuploidy nor is it able to detect all possible malformations.   The ultrasound images and findings were reviewed by me and I agree with the above report. Thomasene Mohair, MD, [facility], [facility] 04/08/2017 4:25 PM    Assessment   22 y.o. G2P1001 at [redacted]w[redacted]d by  08/28/2017, by Last Menstrual Period presenting for routine prenatal visit  Plan   pregnancy Problems (from 02/09/17 to present)    Problem Noted Resolved   Supervision of other normal pregnancy, antepartum 02/09/2017 by Tresea Mall, CNM No   Overview Addendum 02/17/2017  3:30 PM by Oswaldo Conroy, CNM    Clinic [facility] Prenatal Labs  Dating  Blood type: B/Negative/-- (01/30 1131)   Genetic Screen 1 Screen:    AFP:     Quad:     NIPS: Antibody:Negative (01/30 1131)  Anatomic US  Rubella: 0.95 (01/30 1131) Varicella: Non-immune  GTT Early:               Third trimester:  RPR: Non Reactive (01/30 1131)   Rhogam  HBsAg: Negative (01/30 1131)   TDaP vaccine                       Flu Shot: HIV: Non Reactive (01/30 1131)   Baby Food                                GBS:   Contraception  Pap:  CBB     CS/VBAC NA   Support Person Boyfriend [name]               Preterm labor symptoms and general obstetric precautions including but not limited to vaginal bleeding, contractions, leaking of fluid and fetal movement were reviewed in detail with the patient. Please refer to After Visit Summary for other counseling recommendations.   Return in about 1 month (around 05/06/2017) for Routine Prenatal Appointment.  Thomasene MohairStephen Aliece Honold, MD, Merlinda FrederickFACOG [facility], [facility] 04/08/2017 4:25 PM

## 2017-05-09 ENCOUNTER — Ambulatory Visit (INDEPENDENT_AMBULATORY_CARE_PROVIDER_SITE_OTHER): Payer: Medicaid Other | Admitting: Obstetrics and Gynecology

## 2017-05-09 VITALS — BP 118/60 | Wt 131.0 lb

## 2017-05-09 DIAGNOSIS — O26899 Other specified pregnancy related conditions, unspecified trimester: Secondary | ICD-10-CM

## 2017-05-09 DIAGNOSIS — Z6791 Unspecified blood type, Rh negative: Secondary | ICD-10-CM | POA: Insufficient documentation

## 2017-05-09 DIAGNOSIS — Z3480 Encounter for supervision of other normal pregnancy, unspecified trimester: Secondary | ICD-10-CM

## 2017-05-09 DIAGNOSIS — Z3A24 24 weeks gestation of pregnancy: Secondary | ICD-10-CM

## 2017-05-09 NOTE — Progress Notes (Signed)
ROB Patient states cramping yesterday/throwing up blood

## 2017-05-09 NOTE — Progress Notes (Signed)
Routine Prenatal Care Visit  Subjective  Jennifer [name] is a 22 y.o. G2P1001 at [redacted]w[redacted]d being seen today for ongoing prenatal care.  She is currently monitored for the following issues for this low-risk pregnancy and has Supervision of other normal pregnancy, antepartum; ADHD (attention deficit hyperactivity disorder); Allergic rhinitis; Marijuana use; and Rh negative state in antepartum period on their problem list.  ----------------------------------------------------------------------------------- Patient reports no complaints.  Still having occasional nausea and emesis.  Some minimal blood in emesis, but no abdominal pain  Contractions: Not present. Vag. Bleeding: None.  Movement: Present. Denies leaking of fluid.  ----------------------------------------------------------------------------------- The following portions of the patient's history were reviewed and updated as appropriate: allergies, current medications, past family history, past medical history, past social history, past surgical history and problem list. Problem list updated.   Objective  Blood pressure 118/60, weight 131 lb (59.4 kg), last menstrual period 11/21/2016, not currently breastfeeding. Pregravid weight 113 lb (51.3 kg) Total Weight Gain 18 lb (8.165 kg) Urinalysis:      Fetal Status: Fetal Heart Rate (bpm): 145 Fundal Height: 24 cm Movement: Present     General:  Alert, oriented and cooperative. Patient is in no acute distress.  Skin: Skin is warm and dry. No rash noted.   Cardiovascular: Normal heart rate noted  Respiratory: Normal respiratory effort, no problems with respiration noted  Abdomen: Soft, gravid, appropriate for gestational age. Pain/Pressure: Absent     Pelvic:  Cervical exam deferred        Extremities: Normal range of motion.     ental Status: Normal mood and affect. Normal behavior. Normal judgment and thought content.     Assessment   22 y.o. G2P1001 at [redacted]w[redacted]d by  08/28/2017, by Last  Menstrual Period presenting for routine prenatal visit  Plan   pregnancy Problems (from 02/09/17 to present)    Problem Noted Resolved   Rh negative state in antepartum period 05/09/2017 by Vena Austria, MD No   Overview Signed 05/09/2017  3:40 PM by Vena Austria, MD     rhogam 30 weeks      Supervision of other normal pregnancy, antepartum 02/09/2017 by Tresea Mall, CNM No   Overview Addendum 05/09/2017  3:40 PM by Vena Austria, MD    Clinic [facility] Prenatal Labs  Dating LMP = 11 week [place] Blood type: B/Negative/-- (01/30 1131)   Genetic Screen Declined Antibody:Negative (01/30 1131)  Anatomic [place] Normal Rubella: 0.95 (01/30 1131) Varicella: Non-immune  GTT  RPR: Non Reactive (01/30 1131)   Rhogam  30 weeks HBsAg: Negative (01/30 1131)   TDaP vaccine                       Flu Shot: HIV: Non Reactive (01/30 1131)   Baby Food                                GBS:   Contraception  Pap:  CBB     CS/VBAC NA   Support Person Boyfriend [name]               Gestational age appropriate obstetric precautions including but not limited to vaginal bleeding, contractions, leaking of fluid and fetal movement were reviewed in detail with the patient.    Return in about 1 month (around 06/06/2017) for ROB and 28 week labs.  Vena Austria, MD, [facility], [facility] 05/09/2017, 4:10 PM

## 2017-06-08 ENCOUNTER — Encounter: Payer: Medicaid Other | Admitting: Obstetrics and Gynecology

## 2017-06-14 ENCOUNTER — Encounter: Payer: Self-pay | Admitting: Obstetrics and Gynecology

## 2017-06-14 ENCOUNTER — Ambulatory Visit (INDEPENDENT_AMBULATORY_CARE_PROVIDER_SITE_OTHER): Payer: Medicaid Other | Admitting: Obstetrics and Gynecology

## 2017-06-14 ENCOUNTER — Other Ambulatory Visit: Payer: Medicaid Other

## 2017-06-14 VITALS — BP 90/60 | Wt 136.0 lb

## 2017-06-14 DIAGNOSIS — Z3480 Encounter for supervision of other normal pregnancy, unspecified trimester: Secondary | ICD-10-CM

## 2017-06-14 DIAGNOSIS — Z3A29 29 weeks gestation of pregnancy: Secondary | ICD-10-CM

## 2017-06-14 DIAGNOSIS — O26899 Other specified pregnancy related conditions, unspecified trimester: Secondary | ICD-10-CM

## 2017-06-14 DIAGNOSIS — Z6791 Unspecified blood type, Rh negative: Secondary | ICD-10-CM

## 2017-06-14 DIAGNOSIS — F1290 Cannabis use, unspecified, uncomplicated: Secondary | ICD-10-CM

## 2017-06-14 NOTE — Progress Notes (Incomplete)
  Routine Prenatal Care Visit  Subjective  Jennifer [name] is a 22 y.o. G2P1001 at [redacted]w[redacted]d being seen today for ongoing prenatal care.  She is currently monitored for the following issues for this {Blank single:19197::"high-risk","low-risk"} pregnancy and has Supervision of other normal pregnancy, antepartum; ADHD (attention deficit hyperactivity disorder); Allergic rhinitis; Marijuana use; and Rh negative state in antepartum period on their problem list.  ----------------------------------------------------------------------------------- Patient reports {sx:14538}.   Contractions: Not present. Vag. Bleeding: None.  Movement: Present. Denies leaking of fluid.  ----------------------------------------------------------------------------------- The following portions of the patient's history were reviewed and updated as appropriate: allergies, current medications, past family history, past medical history, past social history, past surgical history and problem list. Problem list updated.   Objective  Blood pressure 90/60, weight 136 lb (61.7 kg), last menstrual period 11/21/2016, not currently breastfeeding. Pregravid weight 113 lb (51.3 kg) Total Weight Gain 23 lb (10.4 kg) Urinalysis: Urine Protein: Negative Urine Glucose: Negative  Fetal Status: Fetal Heart Rate (bpm): 145 Fundal Height: 30 cm Movement: Present     General:  Alert, oriented and cooperative. Patient is in no acute distress.  Skin: Skin is warm and dry. No rash noted.   Cardiovascular: Normal heart rate noted  Respiratory: Normal respiratory effort, no problems with respiration noted  Abdomen: Soft, gravid, appropriate for gestational age. Pain/Pressure: Absent     Pelvic:  {Blank single:19197::"Cervical exam performed","Cervical exam deferred"}        Extremities: Normal range of motion.     Mental Status: Normal mood and affect. Normal behavior. Normal judgment and thought content.   Assessment   22 y.o. G2P1001 at [redacted]w[redacted]d  by  08/28/2017, by Last Menstrual Period presenting for {Blank single:19197::"routine","work-in"} prenatal visit  Plan   pregnancy Problems (from 02/09/17 to present)    Problem Noted Resolved   Rh negative state in antepartum period 05/09/2017 by Staebler, Andreas, MD No   Overview Signed 05/09/2017  3:40 PM by Staebler, Andreas, MD    [ ] rhogam 30 weeks      Supervision of other normal pregnancy, antepartum 02/09/2017 by Gledhill, Jane, CNM No   Overview Addendum 05/09/2017  3:40 PM by Staebler, Andreas, MD    [facility]inic [facility] Prenatal Labs  Dating LMP = 11 week US Blood type: B/Negative/-- (01/30 1131)   Genetic Screen Declined Antibody:Negative (01/30 1131)  Anatomic US Normal Rubella: 0.95 (01/30 1131) Varicella: Non-immune  GTT  RPR: Non Reactive (01/30 1131)   Rhogam [ ] 30 weeks HBsAg: Negative (01/30 1131)   TDaP vaccine                       Flu Shot: HIV: Non Reactive (01/30 1131)   Baby Food                                GBS:   Contraception  Pap:  CBB     CS/VBAC NA   Support Person Boyfriend [name]               {Blank single:19197::"Term","Preterm"} labor symptoms and general obstetric precautions including but not limited to vaginal bleeding, contractions, leaking of fluid and fetal movement were reviewed in detail with the patient. Please refer to After Visit Summary for other counseling recommendations.   Return in about 2 weeks (around 06/28/2017) for Routine Prenatal Appointment.  Jennifer Yusupov, MD, FACOG [facility], [facility] 06/14/2017 11:16 AM   

## 2017-06-14 NOTE — Progress Notes (Signed)
  Routine Prenatal Care Visit  Subjective  Jennifer [name] is a 22 y.o. G2P1001 at 739w2d being seen today for ongoing prenatal care.  She is currently monitored for the following issues for this low-risk pregnancy and has Supervision of other normal pregnancy, antepartum; ADHD (attention deficit hyperactivity disorder); Allergic rhinitis; Marijuana use; and Rh negative state in antepartum period on their problem list.  ----------------------------------------------------------------------------------- Patient reports no complaints.   Contractions: Not present. Vag. Bleeding: None.  Movement: Present. Denies leaking of fluid.  ----------------------------------------------------------------------------------- The following portions of the patient's history were reviewed and updated as appropriate: allergies, current medications, past family history, past medical history, past social history, past surgical history and problem list. Problem list updated.   Objective  Blood pressure 90/60, weight 136 lb (61.7 kg), last menstrual period 11/21/2016, not currently breastfeeding. Pregravid weight 113 lb (51.3 kg) Total Weight Gain 23 lb (10.4 kg) Urinalysis: Urine Protein: Negative Urine Glucose: Negative  Fetal Status: Fetal Heart Rate (bpm): 145 Fundal Height: 30 cm Movement: Present     General:  Alert, oriented and cooperative. Patient is in no acute distress.  Skin: Skin is warm and dry. No rash noted.   Cardiovascular: Normal heart rate noted  Respiratory: Normal respiratory effort, no problems with respiration noted  Abdomen: Soft, gravid, appropriate for gestational age. Pain/Pressure: Absent     Pelvic:  Cervical exam deferred        Extremities: Normal range of motion.     Mental Status: Normal mood and affect. Normal behavior. Normal judgment and thought content.   Assessment   22 y.o. G2P1001 at 739w2d by  08/28/2017, by Last Menstrual Period presenting for routine prenatal  visit  Plan   pregnancy Problems (from 02/09/17 to present)    Problem Noted Resolved   Rh negative state in antepartum period 05/09/2017 by Vena AustriaStaebler, Andreas, MD No   Overview Signed 05/09/2017  3:40 PM by Vena AustriaStaebler, Andreas, MD    [ ]  rhogam 30 weeks      Supervision of other normal pregnancy, antepartum 02/09/2017 by Tresea MallGledhill, Jane, CNM No   Overview Addendum 05/09/2017  3:40 PM by Vena AustriaStaebler, Andreas, MD    [facility]inic [facility] Prenatal Labs  Dating LMP = 11 week US Blood type: B/Negative/-- (01/30 1131)   Genetic Screen Declined Antibody:Negative (01/30 1131)  Anatomic US Normal Rubella: 0.95 (01/30 1131) Varicella: Non-immune  GTT  RPR: Non Reactive (01/30 1131)   Rhogam [ ]  30 weeks HBsAg: Negative (01/30 1131)   TDaP vaccine                       Flu Shot: HIV: Non Reactive (01/30 1131)   Baby Food                                GBS:   Contraception  Pap:  CBB     CS/VBAC NA   Support Person Boyfriend [name]               Preterm labor symptoms and general obstetric precautions including but not limited to vaginal bleeding, contractions, leaking of fluid and fetal movement were reviewed in detail with the patient. Please refer to After Visit Summary for other counseling recommendations.   - 28 week labs today  Return in about 2 weeks (around 06/28/2017) for Routine Prenatal Appointment.  Thomasene MohairStephen Lunden Stieber, MD, Merlinda FrederickFACOG [facility], [facility] 06/14/2017 11:16 AM

## 2017-06-14 NOTE — Progress Notes (Incomplete)
  Routine Prenatal Care Visit  Subjective  Jennifer [name] is a 22 y.o. G2P1001 at 7220w2d being seen today for ongoing prenatal care.  She is currently monitored for the following issues for this {Blank single:19197::"high-risk","low-risk"} pregnancy and has Supervision of other normal pregnancy, antepartum; ADHD (attention deficit hyperactivity disorder); Allergic rhinitis; Marijuana use; and Rh negative state in antepartum period on their problem list.  ----------------------------------------------------------------------------------- Patient reports {sx:14538}.   Contractions: Not present. Vag. Bleeding: None.  Movement: Present. Denies leaking of fluid.  ----------------------------------------------------------------------------------- The following portions of the patient's history were reviewed and updated as appropriate: allergies, current medications, past family history, past medical history, past social history, past surgical history and problem list. Problem list updated.   Objective  Blood pressure 90/60, weight 136 lb (61.7 kg), last menstrual period 11/21/2016, not currently breastfeeding. Pregravid weight 113 lb (51.3 kg) Total Weight Gain 23 lb (10.4 kg) Urinalysis: Urine Protein: Negative Urine Glucose: Negative  Fetal Status: Fetal Heart Rate (bpm): 145 Fundal Height: 30 cm Movement: Present     General:  Alert, oriented and cooperative. Patient is in no acute distress.  Skin: Skin is warm and dry. No rash noted.   Cardiovascular: Normal heart rate noted  Respiratory: Normal respiratory effort, no problems with respiration noted  Abdomen: Soft, gravid, appropriate for gestational age. Pain/Pressure: Absent     Pelvic:  {Blank single:19197::"Cervical exam performed","Cervical exam deferred"}        Extremities: Normal range of motion.     Mental Status: Normal mood and affect. Normal behavior. Normal judgment and thought content.   Assessment   22 y.o. G2P1001 at 3820w2d  by  08/28/2017, by Last Menstrual Period presenting for {Blank single:19197::"routine","work-in"} prenatal visit  Plan   pregnancy Problems (from 02/09/17 to present)    Problem Noted Resolved   Rh negative state in antepartum period 05/09/2017 by Vena AustriaStaebler, Andreas, MD No   Overview Signed 05/09/2017  3:40 PM by Vena AustriaStaebler, Andreas, MD    [ ]  rhogam 30 weeks      Supervision of other normal pregnancy, antepartum 02/09/2017 by Tresea MallGledhill, Jane, CNM No   Overview Addendum 05/09/2017  3:40 PM by Vena AustriaStaebler, Andreas, MD    [facility] Prenatal Labs  Dating LMP = 11 week US Blood type: B/Negative/-- (01/30 1131)   Genetic Screen Declined Antibody:Negative (01/30 1131)  Anatomic US Normal Rubella: 0.95 (01/30 1131) Varicella: Non-immune  GTT  RPR: Non Reactive (01/30 1131)   Rhogam [ ]  30 weeks HBsAg: Negative (01/30 1131)   TDaP vaccine                       Flu Shot: HIV: Non Reactive (01/30 1131)   Baby Food                                GBS:   Contraception  Pap:  CBB     CS/VBAC NA   Support Person Boyfriend [name]               {Blank single:19197::"Term","Preterm"} labor symptoms and general obstetric precautions including but not limited to vaginal bleeding, contractions, leaking of fluid and fetal movement were reviewed in detail with the patient. Please refer to After Visit Summary for other counseling recommendations.   Return in about 2 weeks (around 06/28/2017) for Routine Prenatal Appointment.  Thomasene MohairStephen Khair Chasteen, MD, Merlinda FrederickFACOG [facility], [facility] 06/14/2017 11:16 AM

## 2017-06-14 NOTE — Progress Notes (Incomplete)
  Routine Prenatal Care Visit  Subjective  Jennifer [name] is a 22 y.o. G2P1001 at [redacted]w[redacted]d being seen today for ongoing prenatal care.  She is currently monitored for the following issues for this {Blank single:19197::"high-risk","low-risk"} pregnancy and has Supervision of other normal pregnancy, antepartum; ADHD (attention deficit hyperactivity disorder); Allergic rhinitis; Marijuana use; and Rh negative state in antepartum period on their problem list.  ----------------------------------------------------------------------------------- Patient reports {sx:14538}.   Contractions: Not present. Vag. Bleeding: None.  Movement: Present. Denies leaking of fluid.  ----------------------------------------------------------------------------------- The following portions of the patient's history were reviewed and updated as appropriate: allergies, current medications, past family history, past medical history, past social history, past surgical history and problem list. Problem list updated.   Objective  Blood pressure 90/60, weight 136 lb (61.7 kg), last menstrual period 11/21/2016, not currently breastfeeding. Pregravid weight 113 lb (51.3 kg) Total Weight Gain 23 lb (10.4 kg) Urinalysis: Urine Protein: Negative Urine Glucose: Negative  Fetal Status: Fetal Heart Rate (bpm): 145 Fundal Height: 30 cm Movement: Present     General:  Alert, oriented and cooperative. Patient is in no acute distress.  Skin: Skin is warm and dry. No rash noted.   Cardiovascular: Normal heart rate noted  Respiratory: Normal respiratory effort, no problems with respiration noted  Abdomen: Soft, gravid, appropriate for gestational age. Pain/Pressure: Absent     Pelvic:  {Blank single:19197::"Cervical exam performed","Cervical exam deferred"}        Extremities: Normal range of motion.     Mental Status: Normal mood and affect. Normal behavior. Normal judgment and thought content.   Assessment   22 y.o. G2P1001 at [redacted]w[redacted]d  by  08/28/2017, by Last Menstrual Period presenting for {Blank single:19197::"routine","work-in"} prenatal visit  Plan   pregnancy Problems (from 02/09/17 to present)    Problem Noted Resolved   Rh negative state in antepartum period 05/09/2017 by Staebler, Andreas, MD No   Overview Signed 05/09/2017  3:40 PM by Staebler, Andreas, MD    [ ] rhogam 30 weeks      Supervision of other normal pregnancy, antepartum 02/09/2017 by Gledhill, Jane, CNM No   Overview Addendum 05/09/2017  3:40 PM by Staebler, Andreas, MD    [facility] P[facility]atal [facility]  Dating LMP = 11 week US Blood type: B/Negative/-- (01/30 1131)   Genetic Screen Declined Antibody:Negative (01/30 1131)  Anatomic US Normal Rubella: 0.95 (01/30 1131) Varicella: Non-immune  GTT  RPR: Non Reactive (01/30 1131)   Rhogam [ ] 30 weeks HBsAg: Negative (01/30 1131)   TDaP vaccine                       Flu Shot: HIV: Non Reactive (01/30 1131)   Baby Food                                GBS:   Contraception  Pap:  CBB     CS/VBAC NA   Support Person Boyfriend [name]               {Blank single:19197::"Term","Preterm"} labor symptoms and general obstetric precautions including but not limited to vaginal bleeding, contractions, leaking of fluid and fetal movement were reviewed in detail with the patient. Please refer to After Visit Summary for other counseling recommendations.   Return in about 2 weeks (around 06/28/2017) for Routine Prenatal Appointment.  Stephen Jackson, MD, FACOG [facility], [facility] 06/14/2017 11:16 AM   

## 2017-06-14 NOTE — Progress Notes (Incomplete)
  Routine Prenatal Care Visit  Subjective  Jennifer [name] is a 22 y.o. G2P1001 at [redacted]w[redacted]d being seen today for ongoing prenatal care.  She is currently monitored for the following issues for this {Blank single:19197::"high-risk","low-risk"} pregnancy and has Supervision of other normal pregnancy, antepartum; ADHD (attention deficit hyperactivity disorder); Allergic rhinitis; Marijuana use; and Rh negative state in antepartum period on their problem list.  ----------------------------------------------------------------------------------- Patient reports {sx:14538}.   Contractions: Not present. Vag. Bleeding: None.  Movement: Present. Denies leaking of fluid.  ----------------------------------------------------------------------------------- The following portions of the patient's history were reviewed and updated as appropriate: allergies, current medications, past family history, past medical history, past social history, past surgical history and problem list. Problem list updated.   Objective  Blood pressure 90/60, weight 136 lb (61.7 kg), last menstrual period 11/21/2016, not currently breastfeeding. Pregravid weight 113 lb (51.3 kg) Total Weight Gain 23 lb (10.4 kg) Urinalysis: Urine Protein: Negative Urine Glucose: Negative  Fetal Status: Fetal Heart Rate (bpm): 145 Fundal Height: 30 cm Movement: Present     General:  Alert, oriented and cooperative. Patient is in no acute distress.  Skin: Skin is warm and dry. No rash noted.   Cardiovascular: Normal heart rate noted  Respiratory: Normal respiratory effort, no problems with respiration noted  Abdomen: Soft, gravid, appropriate for gestational age. Pain/Pressure: Absent     Pelvic:  {Blank single:19197::"Cervical exam performed","Cervical exam deferred"}        Extremities: Normal range of motion.     Mental Status: Normal mood and affect. Normal behavior. Normal judgment and thought content.   Assessment   22 y.o. G2P1001 at [redacted]w[redacted]d  by  08/28/2017, by Last Menstrual Period presenting for {Blank single:19197::"routine","work-in"} prenatal visit  Plan   pregnancy Problems (from 02/09/17 to present)    Problem Noted Resolved   Rh negative state in antepartum period 05/09/2017 by Staebler, Andreas, MD No   Overview Signed 05/09/2017  3:40 PM by Staebler, Andreas, MD    [ ] rhogam 30 weeks      Supervision of other normal pregnancy, antepartum 02/09/2017 by Gledhill, Jane, CNM No   Overview Addendum 05/09/2017  3:40 PM by Staebler, Andreas, MD    [facility] P[facility]atal [facility]  Dating LMP = 11 week US Blood type: B/Negative/-- (01/30 1131)   Genetic Screen Declined Antibody:Negative (01/30 1131)  Anatomic US Normal Rubella: 0.95 (01/30 1131) Varicella: Non-immune  GTT  RPR: Non Reactive (01/30 1131)   Rhogam [ ] 30 weeks HBsAg: Negative (01/30 1131)   TDaP vaccine                       Flu Shot: HIV: Non Reactive (01/30 1131)   Baby Food                                GBS:   Contraception  Pap:  CBB     CS/VBAC NA   Support Person [name]               {Blank single:19197::"Term","Preterm"} labor symptoms and general obstetric precautions including but not limited to vaginal bleeding, contractions, leaking of fluid and fetal movement were reviewed in detail with the patient. Please refer to After Visit Summary for other counseling recommendations.   Return in about 2 weeks (around 06/28/2017) for Routine Prenatal Appointment.  [name], MD, FACOG [facility], [facility] 06/14/2017 11:16 AM   

## 2017-06-15 LAB — 28 WEEKS RH-PANEL
ANTIBODY SCREEN: NEGATIVE
BASOS ABS: 0 10*3/uL (ref 0.0–0.2)
Basos: 0 %
EOS (ABSOLUTE): 0.2 10*3/uL (ref 0.0–0.4)
EOS: 2 %
GESTATIONAL DIABETES SCREEN: 110 mg/dL (ref 65–139)
HIV Screen 4th Generation wRfx: NONREACTIVE
Hematocrit: 29.2 % — ABNORMAL LOW (ref 34.0–46.6)
Hemoglobin: 9.8 g/dL — ABNORMAL LOW (ref 11.1–15.9)
IMMATURE GRANULOCYTES: 1 %
Immature Grans (Abs): 0.1 10*3/uL (ref 0.0–0.1)
Lymphocytes Absolute: 2.1 10*3/uL (ref 0.7–3.1)
Lymphs: 18 %
MCH: 29.6 pg (ref 26.6–33.0)
MCHC: 33.6 g/dL (ref 31.5–35.7)
MCV: 88 fL (ref 79–97)
MONOCYTES: 6 %
Monocytes Absolute: 0.7 10*3/uL (ref 0.1–0.9)
NEUTROS PCT: 73 %
Neutrophils Absolute: 8.7 10*3/uL — ABNORMAL HIGH (ref 1.4–7.0)
PLATELETS: 154 10*3/uL (ref 150–450)
RBC: 3.31 x10E6/uL — ABNORMAL LOW (ref 3.77–5.28)
RDW: 13.1 % (ref 12.3–15.4)
RPR Ser Ql: NONREACTIVE
WBC: 11.7 10*3/uL — ABNORMAL HIGH (ref 3.4–10.8)

## 2017-06-16 ENCOUNTER — Other Ambulatory Visit: Payer: Self-pay | Admitting: Obstetrics and Gynecology

## 2017-06-16 ENCOUNTER — Encounter: Payer: Self-pay | Admitting: Obstetrics and Gynecology

## 2017-06-16 DIAGNOSIS — O99013 Anemia complicating pregnancy, third trimester: Secondary | ICD-10-CM | POA: Insufficient documentation

## 2017-06-16 MED ORDER — FERROUS SULFATE 325 (65 FE) MG PO TABS: 325.0000 mg | ORAL_TABLET | Freq: Every day | ORAL | 2 refills | Status: DC

## 2017-06-28 ENCOUNTER — Ambulatory Visit (INDEPENDENT_AMBULATORY_CARE_PROVIDER_SITE_OTHER): Payer: Medicaid Other | Admitting: Obstetrics and Gynecology

## 2017-06-28 ENCOUNTER — Encounter: Payer: Medicaid Other | Admitting: Obstetrics and Gynecology

## 2017-06-28 ENCOUNTER — Encounter: Payer: Self-pay | Admitting: Obstetrics and Gynecology

## 2017-06-28 VITALS — BP 90/50 | Wt 136.0 lb

## 2017-06-28 DIAGNOSIS — O99013 Anemia complicating pregnancy, third trimester: Secondary | ICD-10-CM

## 2017-06-28 DIAGNOSIS — O26893 Other specified pregnancy related conditions, third trimester: Secondary | ICD-10-CM

## 2017-06-28 DIAGNOSIS — O26899 Other specified pregnancy related conditions, unspecified trimester: Secondary | ICD-10-CM

## 2017-06-28 DIAGNOSIS — Z3480 Encounter for supervision of other normal pregnancy, unspecified trimester: Secondary | ICD-10-CM

## 2017-06-28 DIAGNOSIS — Z6791 Unspecified blood type, Rh negative: Secondary | ICD-10-CM | POA: Diagnosis not present

## 2017-06-28 DIAGNOSIS — Z3A31 31 weeks gestation of pregnancy: Secondary | ICD-10-CM

## 2017-06-28 DIAGNOSIS — Z3483 Encounter for supervision of other normal pregnancy, third trimester: Secondary | ICD-10-CM

## 2017-06-28 DIAGNOSIS — F1290 Cannabis use, unspecified, uncomplicated: Secondary | ICD-10-CM

## 2017-06-28 MED ORDER — RHO D IMMUNE GLOBULIN 1500 UNIT/2ML IJ SOSY
300.0000 ug | PREFILLED_SYRINGE | Freq: Once | INTRAMUSCULAR | Status: AC
  Administered 2017-06-28: 300 ug via INTRAMUSCULAR

## 2017-06-28 NOTE — Progress Notes (Signed)
Pt reports no problems. Tdap at [facility] due to insurance. Blood consent today. Rhogam today.

## 2017-06-28 NOTE — Progress Notes (Signed)
Routine Prenatal Care Visit  Subjective  Jennifer [name] is a 22 y.o. G2P1001 at [redacted]w[redacted]d being seen today for ongoing prenatal care.  She is currently monitored for the following issues for this low-risk pregnancy and has Supervision of other normal pregnancy, antepartum; ADHD (attention deficit hyperactivity disorder); Allergic rhinitis; Marijuana use; Rh negative state in antepartum period; and Anemia, antepartum, third trimester on their problem list.  ----------------------------------------------------------------------------------- Patient reports no complaints.   Contractions: Not present. Vag. Bleeding: None.  Movement: Present. Denies leaking of fluid.  ----------------------------------------------------------------------------------- The following portions of the patient's history were reviewed and updated as appropriate: allergies, current medications, past family history, past medical history, past social history, past surgical history and problem list. Problem list updated.   Objective  Blood pressure (!) 90/50, weight 136 lb (61.7 kg), last menstrual period 11/21/2016, not currently breastfeeding. Pregravid weight 113 lb (51.3 kg) Total Weight Gain 23 lb (10.4 kg) Urinalysis: Urine Protein: Negative Urine Glucose: Negative  Fetal Status: Fetal Heart Rate (bpm): 142 Fundal Height: 32 cm Movement: Present  Presentation: Vertex  General:  Alert, oriented and cooperative. Patient is in no acute distress.  Skin: Skin is warm and dry. No rash noted.   Cardiovascular: Normal heart rate noted  Respiratory: Normal respiratory effort, no problems with respiration noted  Abdomen: Soft, gravid, appropriate for gestational age. Pain/Pressure: Absent     Pelvic:  Cervical exam deferred        Extremities: Normal range of motion.     ental Status: Normal mood and affect. Normal behavior. Normal judgment and thought content.     Assessment   22 y.o. G2P1001 at [redacted]w[redacted]d by  08/28/2017, by  Last Menstrual Period presenting for routine prenatal visit  Plan   pregnancy Problems (from 02/09/17 to present)    Problem Noted Resolved   Anemia, antepartum, third trimester 06/16/2017 by Vena Austria, MD No   Rh negative state in antepartum period 05/09/2017 by Vena Austria, MD No   Overview Signed 05/09/2017  3:40 PM by Vena Austria, MD    [ ]  rhogam 30 weeks      Supervision of other normal pregnancy, antepartum 02/09/2017 by Tresea Mall, CNM No   Overview Addendum 06/28/2017  3:04 PM by Natale Milch, MD    [facility] Prenatal Labs  Dating LMP = 11 week [place] Blood type: B/Negative/-- (01/30 1131)   Genetic Screen Declined Antibody:Negative (01/30 1131)  Anatomic [place] Normal Rubella: 0.95 (01/30 1131) Varicella: Non-immune  GTT 110 RPR: Non Reactive (01/30 1131)   Rhogam [X]  30 weeks HBsAg: Negative (01/30 1131)   TDaP vaccine 06/28/17                       Flu Shot:  HIV: Non Reactive (01/30 1131)   Baby Food Bottle                               GBS:   Contraception  undecided, given information Pap: N/A <21  CBB     CS/VBAC NA   Support Person Boyfriend [name]               Gestational age appropriate obstetric precautions including but not limited to vaginal bleeding, contractions, leaking of fluid and fetal movement were reviewed in detail with the patient.    She is still occasionally smoking marijuana, reports that she has cut back, Given resource to help quit.  Encouraged cessation. Patient is not taking iron supplements, she has not picked them up yet. Will obtain anemia panel today, referral to hematology.  Given information on prenatal classes with [facility].  Given information on birthcontrol options postpartum. Recommended [email or web address].  Discussed breast feeding. Patient is planning on bottle feeding.   Return in about 2 weeks (around 07/12/2017) for ROB.  Adelene Idlerhristanna Devin Foskey MD [facility], [facility] 06/28/17 3:06  PM

## 2017-06-29 LAB — VITAMIN B12: Vitamin B-12: 281 pg/mL (ref 232–1245)

## 2017-06-29 LAB — IRON AND TIBC
IRON SATURATION: 12 % — AB (ref 15–55)
Iron: 51 ug/dL (ref 27–159)
TIBC: 441 ug/dL (ref 250–450)
UIBC: 390 ug/dL (ref 131–425)

## 2017-06-29 LAB — FOLATE: FOLATE: 9.8 ng/mL (ref >3.0)

## 2017-06-29 LAB — FERRITIN: Ferritin: 9 ng/mL — ABNORMAL LOW (ref 15–150)

## 2017-06-29 NOTE — Progress Notes (Signed)
Patient has been referred to hematology, will likely need iron infusion therapy, management per hematology.

## 2017-07-05 ENCOUNTER — Inpatient Hospital Stay: Payer: Medicaid Other

## 2017-07-05 ENCOUNTER — Inpatient Hospital Stay: Payer: Medicaid Other | Attending: Oncology | Admitting: Oncology

## 2017-07-05 ENCOUNTER — Encounter: Payer: Self-pay | Admitting: Oncology

## 2017-07-05 VITALS — BP 100/60 | HR 114 | Temp 97.5°F | Resp 18 | Ht 62.0 in | Wt 137.2 lb

## 2017-07-05 DIAGNOSIS — Z8249 Family history of ischemic heart disease and other diseases of the circulatory system: Secondary | ICD-10-CM

## 2017-07-05 DIAGNOSIS — Z3A32 32 weeks gestation of pregnancy: Secondary | ICD-10-CM | POA: Insufficient documentation

## 2017-07-05 DIAGNOSIS — D509 Iron deficiency anemia, unspecified: Secondary | ICD-10-CM

## 2017-07-05 DIAGNOSIS — Z87891 Personal history of nicotine dependence: Secondary | ICD-10-CM | POA: Diagnosis not present

## 2017-07-05 DIAGNOSIS — O99013 Anemia complicating pregnancy, third trimester: Secondary | ICD-10-CM | POA: Diagnosis not present

## 2017-07-05 DIAGNOSIS — F1290 Cannabis use, unspecified, uncomplicated: Secondary | ICD-10-CM | POA: Diagnosis not present

## 2017-07-05 MED ORDER — CYANOCOBALAMIN 1000 MCG/ML IJ SOLN
1000.0000 ug | Freq: Once | INTRAMUSCULAR | Status: AC
  Administered 2017-07-05: 1000 ug via INTRAMUSCULAR
  Filled 2017-07-05: qty 1

## 2017-07-06 ENCOUNTER — Inpatient Hospital Stay: Payer: Medicaid Other

## 2017-07-06 VITALS — BP 104/56 | HR 92 | Temp 97.8°F | Resp 20

## 2017-07-06 DIAGNOSIS — D509 Iron deficiency anemia, unspecified: Secondary | ICD-10-CM | POA: Diagnosis not present

## 2017-07-06 MED ORDER — SODIUM CHLORIDE 0.9 % IV SOLN
Freq: Once | INTRAVENOUS | Status: AC
  Administered 2017-07-06: 12:00:00 via INTRAVENOUS
  Filled 2017-07-06: qty 1000

## 2017-07-06 MED ORDER — IRON SUCROSE 20 MG/ML IV SOLN
200.0000 mg | INTRAVENOUS | Status: DC
  Administered 2017-07-06: 200 mg via INTRAVENOUS
  Filled 2017-07-06: qty 10

## 2017-07-07 ENCOUNTER — Encounter: Payer: Self-pay | Admitting: Oncology

## 2017-07-07 NOTE — Progress Notes (Signed)
Hematology/Oncology Consult note [facility] [phone] [phone]  Patient Care Team: Mickey Farber, MD as PCP - General (Internal Medicine)   Name of the patient: Jennifer [name]  [id number]  1995/04/16    Reason for referral- iron deficiency anemia in thrid trimester of pregnancy   Referring physician- Dr. Thomasene Mohair  Date of visit: 07/07/17   History of presenting illness-patient is a 22 year old female with no significant past medical history.  She is currently [redacted] weeks pregnant.  This is her second pregnancy and her first pregnancy was uneventful.  She has been referred to [place] for iron deficiency.  Recent CBC on 06/14/2017 showed white count of 11.7, H&H of 9.8/29.2 with an MCV of 88 and a platelet count of 154.  Folate level was normal.  Ferritin level was low at 9.  Iron studies showed a low iron saturation of 12%.  B12 level was low normal at 281.  Patient currently feels fatigued but denies other complaints and her pregnancy is otherwise going well without any complications so far  ECOG PS- 0  Pain scale- 0   Review of systems- Review of Systems  Constitutional: Positive for malaise/fatigue. Negative for chills, fever and weight loss.  HENT: Negative for congestion, ear discharge and nosebleeds.   Eyes: Negative for blurred vision.  Respiratory: Negative for cough, hemoptysis, sputum production, shortness of breath and wheezing.   Cardiovascular: Negative for chest pain, palpitations, orthopnea and claudication.  Gastrointestinal: Negative for abdominal pain, blood in stool, constipation, diarrhea, heartburn, melena, nausea and vomiting.  Genitourinary: Negative for dysuria, flank pain, frequency, hematuria and urgency.  Musculoskeletal: Negative for back pain, joint pain and myalgias.  Skin: Negative for rash.  Neurological: Negative for dizziness, tingling, focal weakness, seizures, weakness and headaches.    Endo/Heme/Allergies: Does not bruise/bleed easily.  Psychiatric/Behavioral: Negative for depression and suicidal ideas. The patient does not have insomnia.     Allergies  Allergen Reactions  . Vicodin [Hydrocodone-Acetaminophen] Itching    Patient Active Problem List   Diagnosis Date Noted  . Iron deficiency anemia 07/05/2017  . Anemia, antepartum, third trimester 06/16/2017  . Rh negative state in antepartum period 05/09/2017  . Marijuana use 03/18/2017  . Supervision of other normal pregnancy, antepartum 02/09/2017  . ADHD (attention deficit hyperactivity disorder) 11/20/2013  . Allergic rhinitis 11/20/2013     Past Medical History:  Diagnosis Date  . Anemia   . Environmental and seasonal allergies      Past Surgical History:  Procedure Laterality Date  . WISDOM TOOTH EXTRACTION     age 42 - all four    Social History   Socioeconomic History  . Marital status: Single    Spouse name: Not on file  . Number of children: 1  . Years of education: 59  . Highest education level: Not on file  Occupational History  . Occupation: UNEMPLOYED  Social Needs  . Financial resource strain: Not on file  . Food insecurity:    Worry: Not on file    Inability: Not on file  . Transportation needs:    Medical: Not on file    Non-medical: Not on file  Tobacco Use  . Smoking status: Former Smoker    Last attempt to quit: 04/28/2015    Years since quitting: 2.1  . Smokeless tobacco: Never Used  . Tobacco comment: quit with positive pregnancy test  Substance and Sexual Activity  . Alcohol use: Yes    Alcohol/week: 3.6 oz  Types: 6 Shots of liquor per week    Comment: weekends  . Drug use: Yes    Types: Marijuana  . Sexual activity: Yes    Birth control/protection: None  Lifestyle  . Physical activity:    Days per week: Not on file    Minutes per session: Not on file  . Stress: Not on file  Relationships  . Social connections:    Talks on phone: Not on file     Gets together: Not on file    Attends religious service: Not on file    Active member of club or organization: Not on file    Attends meetings of clubs or organizations: Not on file    Relationship status: Not on file  . Intimate partner violence:    Fear of current or ex partner: Not on file    Emotionally abused: Not on file    Physically abused: Not on file    Forced sexual activity: Not on file  Other Topics Concern  . Not on file  Social History Narrative  . Not on file     Family History  Problem Relation Age of Onset  . Hypertension Maternal Grandmother   . Breast cancer Other   . Cancer Other 74       lung     Current Outpatient Medications:  .  ferrous sulfate (FERROUSUL) 325 (65 FE) MG tablet, Take 1 tablet (325 mg total) by mouth daily with breakfast. (Patient not taking: Reported on 06/28/2017), Disp: 30 tablet, Rfl: 2 .  Prenat-Fe Carbonyl-FA-Omega 3 (ONE-A-DAY WOMENS PRENATAL 1) 28-0.8-235 MG CAPS, Take 1 capsule by mouth daily. (Patient not taking: Reported on 07/05/2017), Disp: 30 capsule, Rfl: 6   Physical exam:  Vitals:   07/05/17 1349  BP: 100/60  Pulse: (!) 114  Resp: 18  Temp: (!) 97.5 F (36.4 C)  TempSrc: Tympanic  SpO2: 99%  Weight: 137 lb 3.2 oz (62.2 kg)  Height: 5\' 2"  (1.575 m)   Physical Exam  Constitutional: She is oriented to person, place, and time. She appears well-developed and well-nourished.  HENT:  Head: Normocephalic and atraumatic.  Eyes: Pupils are equal, round, and reactive to light. EOM are normal.  Neck: Normal range of motion.  Cardiovascular: Normal rate, regular rhythm and normal heart sounds.  Pulmonary/Chest: Effort normal and breath sounds normal.  Abdominal:  Gravid uterus  Musculoskeletal: She exhibits no edema.  Neurological: She is alert and oriented to person, place, and time.  Skin: Skin is warm and dry.       CMP Latest Ref Rng & Units 05/23/2016  Glucose 65 - 99 mg/dL 161(W107(H)  BUN 6 - 20 mg/dL 12   Creatinine 9.600.44 - 1.00 mg/dL 4.540.70  Sodium 098135 - 119145 mmol/L 137  Potassium 3.5 - 5.1 mmol/L 3.9  Chloride 101 - 111 mmol/L 100(L)  CO2 22 - 32 mmol/L 27  Calcium 8.9 - 10.3 mg/dL 9.3  Total Protein 6.5 - 8.1 g/dL 8.1  Total Bilirubin 0.3 - 1.2 mg/dL 0.7  Alkaline Phos 38 - 126 U/L 77  AST 15 - 41 U/L 28  ALT 14 - 54 U/L 21   CBC Latest Ref Rng & Units 06/14/2017  WBC 3.4 - 10.8 x10E3/uL 11.7(H)  Hemoglobin 11.1 - 15.9 g/dL 1.4(N9.8(L)  Hematocrit 82.934.0 - 46.6 % 29.2(L)  Platelets 150 - 450 x10E3/uL 154     Assessment and plan- Patient is a 22 y.o. female referred for anemia in the third trimester of pregnancy  and evidence of iron deficiency  Recent labs done 3 weeks ago were suggestive of iron deficiency.  I therefore recommend IV iron venofer she will get her first dose of IV iron this week.  200 mg IV weekly x4.  Discussed risks and benefits of Venofer including all but not limited to headaches, leg swelling and possible risk of infusion reaction.  Patient understands and agrees with to proceed as planned.Also her B12 levels were low normal at 283.  We will give her a B12 shot at this time and she will continue to take oral B12 thousand micrograms daily.    I will see her back in 4 to 5 weeks time with repeat CBC ferritin and iron studies and B12 levels. She also gets b12 shot on that day   Thank you for this kind referral and the opportunity to participate in the care of this patient   Visit Diagnosis 1. Anemia, antepartum, third trimester   2. Iron deficiency anemia, unspecified iron deficiency anemia type     Dr. Owens Shark, MD, MPH [facility] [phone] 07/07/2017  10:29 AM

## 2017-07-12 ENCOUNTER — Encounter: Payer: Medicaid Other | Admitting: Obstetrics and Gynecology

## 2017-07-13 ENCOUNTER — Ambulatory Visit: Payer: Medicaid Other

## 2017-07-15 ENCOUNTER — Ambulatory Visit (INDEPENDENT_AMBULATORY_CARE_PROVIDER_SITE_OTHER): Payer: Medicaid Other | Admitting: Advanced Practice Midwife

## 2017-07-15 ENCOUNTER — Encounter: Payer: Self-pay | Admitting: Advanced Practice Midwife

## 2017-07-15 VITALS — BP 104/60 | Wt 141.0 lb

## 2017-07-15 DIAGNOSIS — Z3A33 33 weeks gestation of pregnancy: Secondary | ICD-10-CM

## 2017-07-15 NOTE — Progress Notes (Signed)
Routine Prenatal Care Visit  Subjective  Jennifer [name] is a 22 y.o. G2P1001 at 7831w5d being seen today for ongoing prenatal care.  She is currently monitored for the following issues for this low-risk pregnancy and has Supervision of other normal pregnancy, antepartum; ADHD (attention deficit hyperactivity disorder); Allergic rhinitis; Marijuana use; Rh negative state in antepartum period; Anemia, antepartum, third trimester; and Iron deficiency anemia on their problem list.  ----------------------------------------------------------------------------------- Patient reports feeling tired. She reports getting adequate sleep.  Hematology appointments are scheduled. Contractions: Not present. Vag. Bleeding: None.  Movement: Present. Denies leaking of fluid.  ----------------------------------------------------------------------------------- The following portions of the patient's history were reviewed and updated as appropriate: allergies, current medications, past family history, past medical history, past social history, past surgical history and problem list. Problem list updated.   Objective  Blood pressure 104/60, weight 141 lb (64 kg), last menstrual period 11/21/2016, not currently breastfeeding. Pregravid weight 113 lb (51.3 kg) Total Weight Gain 28 lb (12.7 kg) Urinalysis: Urine Protein: Negative Urine Glucose: Negative  Fetal Status: Fetal Heart Rate (bpm): 127 Fundal Height: 34 cm Movement: Present     General:  Alert, oriented and cooperative. Patient is in no acute distress.  Skin: Skin is warm and dry. No rash noted.   Cardiovascular: Normal heart rate noted  Respiratory: Normal respiratory effort, no problems with respiration noted  Abdomen: Soft, gravid, appropriate for gestational age. Pain/Pressure: Absent     Pelvic:  Cervical exam deferred        Extremities: Normal range of motion.  Edema: None  Mental Status: Normal mood and affect. Normal behavior. Normal judgment and  thought content.   Assessment   22 y.o. G2P1001 at 3231w5d by  08/28/2017, by Last Menstrual Period presenting for routine prenatal visit  Plan   pregnancy Problems (from 02/09/17 to present)    Problem Noted Resolved   Anemia, antepartum, third trimester 06/16/2017 by Vena AustriaStaebler, Andreas, MD No   Rh negative state in antepartum period 05/09/2017 by Vena AustriaStaebler, Andreas, MD No   Overview Signed 05/09/2017  3:40 PM by Vena AustriaStaebler, Andreas, MD    [ ]  rhogam 30 weeks      Supervision of other normal pregnancy, antepartum 02/09/2017 by Tresea MallGledhill, Marshae Azam, CNM No   Overview Addendum 06/28/2017  3:04 PM by Natale MilchSchuman, Christanna R, MD    [facility]inic [facility] Prenatal Labs  Dating LMP = 11 week US Blood type: B/Negative/-- (01/30 1131)   Genetic Screen Declined Antibody:Negative (01/30 1131)  Anatomic US Normal Rubella: 0.95 (01/30 1131) Varicella: Non-immune  GTT 110 RPR: Non Reactive (01/30 1131)   Rhogam [X]  30 weeks HBsAg: Negative (01/30 1131)   TDaP vaccine 06/28/17                       Flu Shot:  HIV: Non Reactive (01/30 1131)   Baby Food Bottle                               GBS:   Contraception  undecided, given information Pap: N/A <21  CBB     CS/VBAC NA   Support Person Boyfriend [name]               Preterm labor symptoms and general obstetric precautions including but not limited to vaginal bleeding, contractions, leaking of fluid and fetal movement were reviewed in detail with the patient.   Return in about 2 weeks (around 07/29/2017) for rob.  Tresea Mall, CNM 07/15/2017 10:25 AM

## 2017-07-15 NOTE — Progress Notes (Signed)
ROB

## 2017-07-19 ENCOUNTER — Inpatient Hospital Stay: Payer: Medicaid Other | Attending: Oncology

## 2017-07-19 DIAGNOSIS — D509 Iron deficiency anemia, unspecified: Secondary | ICD-10-CM | POA: Insufficient documentation

## 2017-07-20 ENCOUNTER — Ambulatory Visit: Payer: Medicaid Other

## 2017-07-22 ENCOUNTER — Inpatient Hospital Stay: Payer: Medicaid Other

## 2017-07-22 VITALS — BP 100/61 | HR 97 | Temp 97.8°F | Resp 20

## 2017-07-22 DIAGNOSIS — D509 Iron deficiency anemia, unspecified: Secondary | ICD-10-CM

## 2017-07-22 MED ORDER — IRON SUCROSE 20 MG/ML IV SOLN
200.0000 mg | INTRAVENOUS | Status: DC
  Administered 2017-07-22: 200 mg via INTRAVENOUS
  Filled 2017-07-22: qty 10

## 2017-07-22 MED ORDER — SODIUM CHLORIDE 0.9 % IV SOLN
Freq: Once | INTRAVENOUS | Status: AC
  Administered 2017-07-22: 14:00:00 via INTRAVENOUS
  Filled 2017-07-22: qty 1000

## 2017-07-27 ENCOUNTER — Inpatient Hospital Stay: Payer: Medicaid Other

## 2017-07-29 ENCOUNTER — Encounter: Payer: Medicaid Other | Admitting: Advanced Practice Midwife

## 2017-07-29 ENCOUNTER — Encounter: Payer: Self-pay | Admitting: Advanced Practice Midwife

## 2017-07-29 ENCOUNTER — Ambulatory Visit (INDEPENDENT_AMBULATORY_CARE_PROVIDER_SITE_OTHER): Payer: Medicaid Other | Admitting: Advanced Practice Midwife

## 2017-07-29 VITALS — BP 100/58 | Wt 142.0 lb

## 2017-07-29 DIAGNOSIS — O360110 Maternal care for anti-D [Rh] antibodies, first trimester, not applicable or unspecified: Secondary | ICD-10-CM

## 2017-07-29 DIAGNOSIS — O99013 Anemia complicating pregnancy, third trimester: Secondary | ICD-10-CM

## 2017-07-29 DIAGNOSIS — D649 Anemia, unspecified: Secondary | ICD-10-CM

## 2017-07-29 DIAGNOSIS — Z3A35 35 weeks gestation of pregnancy: Secondary | ICD-10-CM

## 2017-07-29 NOTE — Progress Notes (Signed)
States she had some braxton hicks ctx last week.

## 2017-07-29 NOTE — Progress Notes (Signed)
Routine Prenatal Care Visit  Subjective  Jennifer [name] is a 22 y.o. G2P1001 at [redacted]w[redacted]d being seen today for ongoing prenatal care.  She is currently monitored for the following issues for this low-risk pregnancy and has Supervision of other normal pregnancy, antepartum; ADHD (attention deficit hyperactivity disorder); Allergic rhinitis; Marijuana use; Rh negative state in antepartum period; Anemia, antepartum, third trimester; and Iron deficiency anemia on their problem list.  ----------------------------------------------------------------------------------- Patient reports occasional contractions. She had contractions last week that were crampy and tight but stopped after a couple of hours. She did not go to L&D for evaluation. Contractions: Irregular. Vag. Bleeding: None.  Movement: Present. Denies leaking of fluid.  ----------------------------------------------------------------------------------- The following portions of the patient's history were reviewed and updated as appropriate: allergies, current medications, past family history, past medical history, past social history, past surgical history and problem list. Problem list updated.   Objective  Blood pressure (!) 100/58, weight 142 lb (64.4 kg), last menstrual period 11/21/2016, not currently breastfeeding. Pregravid weight 113 lb (51.3 kg) Total Weight Gain 29 lb (13.2 kg) Urinalysis:      Fetal Status: Fetal Heart Rate (bpm): 128 Fundal Height: 36 cm Movement: Present     General:  Alert, oriented and cooperative. Patient is in no acute distress.  Skin: Skin is warm and dry. No rash noted.   Cardiovascular: Normal heart rate noted  Respiratory: Normal respiratory effort, no problems with respiration noted  Abdomen: Soft, gravid, appropriate for gestational age. Pain/Pressure: Present     Pelvic:  Cervical exam deferred        Extremities: Normal range of motion.  Edema: None  Mental Status: Normal mood and affect. Normal  behavior. Normal judgment and thought content.   Assessment   22 y.o. G2P1001 at [redacted]w[redacted]d by  08/28/2017, by Last Menstrual Period presenting for routine prenatal visit  Plan   pregnancy Problems (from 02/09/17 to present)    Problem Noted Resolved   Anemia, antepartum, third trimester 06/16/2017 by Vena Austria, MD No   Rh negative state in antepartum period 05/09/2017 by Vena Austria, MD No   Overview Signed 05/09/2017  3:40 PM by Vena Austria, MD    [ ]  rhogam 30 weeks      Supervision of other normal pregnancy, antepartum 02/09/2017 by Tresea Mall, CNM No   Overview Addendum 06/28/2017  3:04 PM by Natale Milch, MD    [facility] Prenatal Labs  Dating LMP = 11 week [place] Blood type: B/Negative/-- (01/30 1131)   Genetic Screen Declined Antibody:Negative (01/30 1131)  Anatomic [place] Normal Rubella: 0.95 (01/30 1131) Varicella: Non-immune  GTT 110 RPR: Non Reactive (01/30 1131)   Rhogam [X]  30 weeks HBsAg: Negative (01/30 1131)   TDaP vaccine 06/28/17                       Flu Shot:  HIV: Non Reactive (01/30 1131)   Baby Food Bottle                               GBS:   Contraception  undecided, given information Pap: N/A <21  CBB     CS/VBAC NA   Support Person [name]               Preterm labor symptoms and general obstetric precautions including but not limited to vaginal bleeding, contractions, leaking of fluid and fetal movement were reviewed in detail with the  patient.  GBS/aptima next week   Return in about 1 week (around 08/05/2017) for rob.  Tresea MallJane Jennifer [name], CNM 07/29/2017 2:39 PM

## 2017-08-05 ENCOUNTER — Encounter: Payer: Self-pay | Admitting: Maternal Newborn

## 2017-08-05 ENCOUNTER — Other Ambulatory Visit (HOSPITAL_COMMUNITY)
Admission: RE | Admit: 2017-08-05 | Discharge: 2017-08-05 | Disposition: A | Discharge status: Home / Self Care | Payer: Medicaid Other | Source: Ambulatory Visit | Attending: Maternal Newborn | Admitting: Maternal Newborn

## 2017-08-05 ENCOUNTER — Ambulatory Visit (INDEPENDENT_AMBULATORY_CARE_PROVIDER_SITE_OTHER): Payer: Medicaid Other | Admitting: Maternal Newborn

## 2017-08-05 VITALS — BP 100/70 | Wt 141.5 lb

## 2017-08-05 DIAGNOSIS — Z3A36 36 weeks gestation of pregnancy: Secondary | ICD-10-CM | POA: Diagnosis not present

## 2017-08-05 DIAGNOSIS — O360130 Maternal care for anti-D [Rh] antibodies, third trimester, not applicable or unspecified: Secondary | ICD-10-CM

## 2017-08-05 DIAGNOSIS — Z3483 Encounter for supervision of other normal pregnancy, third trimester: Secondary | ICD-10-CM | POA: Insufficient documentation

## 2017-08-05 DIAGNOSIS — Z3480 Encounter for supervision of other normal pregnancy, unspecified trimester: Secondary | ICD-10-CM

## 2017-08-05 DIAGNOSIS — O99013 Anemia complicating pregnancy, third trimester: Secondary | ICD-10-CM

## 2017-08-05 DIAGNOSIS — Z113 Encounter for screening for infections with a predominantly sexual mode of transmission: Secondary | ICD-10-CM

## 2017-08-05 LAB — OB RESULTS CONSOLE GC/CHLAMYDIA: Gonorrhea: NEGATIVE

## 2017-08-05 NOTE — Progress Notes (Signed)
Routine Prenatal Care Visit  Subjective  Jennifer [name] is a 22 y.o. G2P1001 at 3655w5d being seen today for ongoing prenatal care.  She is currently monitored for the following issues for this low-risk pregnancy and has Supervision of other normal pregnancy, antepartum; ADHD (attention deficit hyperactivity disorder); Allergic rhinitis; Marijuana use; Rh negative state in antepartum period; Anemia, antepartum, third trimester; and Iron deficiency anemia on their problem list.  ----------------------------------------------------------------------------------- Patient reports no complaints.   Contractions: Not present. Vag. Bleeding: None.  Movement: Present. No leaking of fluid.  ----------------------------------------------------------------------------------- The following portions of the patient's history were reviewed and updated as appropriate: allergies, current medications, past family history, past medical history, past social history, past surgical history and problem list. Problem list updated.   Objective  Blood pressure 100/70, weight 141 lb 8 oz (64.2 kg), last menstrual period 11/21/2016, not currently breastfeeding. Pregravid weight 113 lb (51.3 kg) Total Weight Gain 28 lb 8 oz (12.9 kg) Urinalysis: Urine Protein: Negative Urine Glucose: Negative  Fetal Status: Fetal Heart Rate (bpm): 144 Fundal Height: 37 cm Movement: Present  Presentation: Vertex  General:  Alert, oriented and cooperative. Patient is in no acute distress.  Skin: Skin is warm and dry. No rash noted.   Cardiovascular: Normal heart rate noted  Respiratory: Normal respiratory effort, no problems with respiration noted  Abdomen: Soft, gravid, appropriate for gestational age. Pain/Pressure: Present     Pelvic:  Cervical exam performed Dilation: Closed Effacement (%): 0 Station: -3  Extremities: Normal range of motion.  Edema: None  Mental Status: Normal mood and affect. Normal behavior. Normal judgment and  thought content.     Assessment   22 y.o. G2P1001 at 2155w5d, EDD 08/28/2017 by Last Menstrual Period presenting for routine prenatal visit.  Plan   pregnancy Problems (from 02/09/17 to present)    Problem Noted Resolved   Anemia, antepartum, third trimester 06/16/2017 by Vena AustriaStaebler, Andreas, MD No   Rh negative state in antepartum period 05/09/2017 by Vena AustriaStaebler, Andreas, MD No   Overview Signed 05/09/2017  3:40 PM by Vena AustriaStaebler, Andreas, MD    [ ]  rhogam 30 weeks      Supervision of other normal pregnancy, antepartum 02/09/2017 by Tresea MallGledhill, Jane, CNM No   Overview Addendum 06/28/2017  3:04 PM by Natale MilchSchuman, Christanna R, MD    [facility] Prenatal Labs  Dating LMP = 11 week US Blood type: B/Negative/-- (01/30 1131)   Genetic Screen Declined Antibody:Negative (01/30 1131)  Anatomic US Normal Rubella: 0.95 (01/30 1131) Varicella: Non-immune  GTT 110 RPR: Non Reactive (01/30 1131)   Rhogam [X]  30 weeks HBsAg: Negative (01/30 1131)   TDaP vaccine 06/28/17                       Flu Shot:  HIV: Non Reactive (01/30 1131)   Baby Food Bottle                               GBS:   Contraception  undecided, given information Pap: N/A <21  CBB     CS/VBAC NA   Support Person Boyfriend [name]               Preterm labor symptoms and general obstetric precautions including but not limited to vaginal bleeding, contractions, leaking of fluid and fetal movement were reviewed.  Return in about 1 week (around 08/12/2017) for ROB.  Marcelyn BruinsJacelyn Tuwana Kapaun, CNM 08/05/2017  3:27  PM

## 2017-08-05 NOTE — Progress Notes (Signed)
No concerns.rj 

## 2017-08-07 LAB — STREP GP B NAA: Strep Gp B NAA: NEGATIVE

## 2017-08-10 LAB — CERVICOVAGINAL ANCILLARY ONLY
Chlamydia: NEGATIVE
Neisseria Gonorrhea: NEGATIVE

## 2017-08-12 ENCOUNTER — Ambulatory Visit (INDEPENDENT_AMBULATORY_CARE_PROVIDER_SITE_OTHER): Payer: Medicaid Other | Admitting: Maternal Newborn

## 2017-08-12 ENCOUNTER — Encounter: Payer: Self-pay | Admitting: Maternal Newborn

## 2017-08-12 VITALS — BP 120/70 | Wt 141.0 lb

## 2017-08-12 DIAGNOSIS — Z3A37 37 weeks gestation of pregnancy: Secondary | ICD-10-CM

## 2017-08-12 DIAGNOSIS — O360130 Maternal care for anti-D [Rh] antibodies, third trimester, not applicable or unspecified: Secondary | ICD-10-CM

## 2017-08-12 DIAGNOSIS — Z3480 Encounter for supervision of other normal pregnancy, unspecified trimester: Secondary | ICD-10-CM

## 2017-08-12 NOTE — Progress Notes (Signed)
Routine Prenatal Care Visit  Subjective  Jennifer [name] is a 22 y.o. G2P1001 at 2056w5d being seen today for ongoing prenatal care.  She is currently monitored for the following issues for this low-risk pregnancy and has Supervision of other normal pregnancy, antepartum; ADHD (attention deficit hyperactivity disorder); Allergic rhinitis; Marijuana use; Rh negative state in antepartum period; Anemia, antepartum, third trimester; and Iron deficiency anemia on their problem list.  ----------------------------------------------------------------------------------- Patient reports occasional contractions.   Contractions: Not present. Vag. Bleeding: None.  Movement: Present. No leaking of fluid.  ----------------------------------------------------------------------------------- The following portions of the patient's history were reviewed and updated as appropriate: allergies, current medications, past family history, past medical history, past social history, past surgical history and problem list. Problem list updated.  Objective  Blood pressure 120/70, weight 141 lb (64 kg), last menstrual period 11/21/2016. Body mass index is 25.79 kg/m. Pregravid weight 113 lb (51.3 kg) Total Weight Gain 28 lb (12.7 kg) Urinalysis: Urine Protein: Negative Urine Glucose: Negative  Fetal Status: Fetal Heart Rate (bpm): 140 Fundal Height: 38 cm Movement: Present  Presentation: Vertex  General:  Alert, oriented and cooperative. Patient is in no acute distress.  Skin: Skin is warm and dry. No rash noted.   Cardiovascular: Normal heart rate noted  Respiratory: Normal respiratory effort, no problems with respiration noted  Abdomen: Soft, gravid, appropriate for gestational age. Pain/Pressure: Present     Pelvic:  Cervical exam performed Dilation: 1.5 Effacement (%): 30 Station: -3  Extremities: Normal range of motion.  Edema: None  Mental Status: Normal mood and affect. Normal behavior. Normal judgment and  thought content.     Assessment   22 y.o. G2P1001 at 7856w5d, EDD 08/28/2017 by Last Menstrual Period presenting for routine prenatal visit.  Plan   pregnancy Problems (from 02/09/17 to present)    Problem Noted Resolved   Anemia, antepartum, third trimester 06/16/2017 by Vena AustriaStaebler, Andreas, MD No   Rh negative state in antepartum period 05/09/2017 by Vena AustriaStaebler, Andreas, MD No   Overview Signed 05/09/2017  3:40 PM by Vena AustriaStaebler, Andreas, MD    [ ]  rhogam 30 weeks      Supervision of other normal pregnancy, antepartum 02/09/2017 by Tresea MallGledhill, Jane, CNM No   Overview Addendum 06/28/2017  3:04 PM by Natale MilchSchuman, Christanna R, MD    [facility] Prenatal Labs  Dating LMP = 11 week US Blood type: B/Negative/-- (01/30 1131)   Genetic Screen Declined Antibody:Negative (01/30 1131)  Anatomic US Normal Rubella: 0.95 (01/30 1131) Varicella: Non-immune  GTT 110 RPR: Non Reactive (01/30 1131)   Rhogam [X]  30 weeks HBsAg: Negative (01/30 1131)   TDaP vaccine 06/28/17                       Flu Shot:  HIV: Non Reactive (01/30 1131)   Baby Food Bottle                               GBS:   Contraception  undecided, given information Pap: N/A <21  CBB     CS/VBAC NA   Support Person Boyfriend [name]              Term labor symptoms and general obstetric precautions including but not limited to vaginal bleeding, contractions, leaking of fluid and fetal movement were reviewed.  Please refer to After Visit Summary for other counseling recommendations.   Return in about 1 week (around 08/19/2017)  for ROB.  [name], CNM 08/12/2017  3:18 PM

## 2017-08-12 NOTE — Patient Instructions (Signed)
Vaginal Delivery Vaginal delivery means that you will give birth by pushing your baby out of your birth canal (vagina). A team of health care providers will help you before, during, and after vaginal delivery. Birth experiences are unique for every woman and every pregnancy, and birth experiences vary depending on where you choose to give birth. What should I do to prepare for my baby's birth? Before your baby is born, it is important to talk with your health care provider about:  Your labor and delivery preferences. These may include: ? Medicines that you may be given. ? How you will manage your pain. This might include non-medical pain relief techniques or injectable pain relief such as epidural analgesia. ? How you and your baby will be monitored during labor and delivery. ? Who may be in the labor and delivery room with you. ? Your feelings about surgical delivery of your baby (cesarean delivery, or C-section) if this becomes necessary. ? Your feelings about receiving donated blood through an IV tube (blood transfusion) if this becomes necessary.  Whether you are able: ? To take pictures or videos of the birth. ? To eat during labor and delivery. ? To move around, walk, or change positions during labor and delivery.  What to expect after your baby is born, such as: ? Whether delayed umbilical cord clamping and cutting is offered. ? Who will care for your baby right after birth. ? Medicines or tests that may be recommended for your baby. ? Whether breastfeeding is supported in your hospital or birth center. ? How long you will be in the hospital or birth center.  How any medical conditions you have may affect your baby or your labor and delivery experience.  To prepare for your baby's birth, you should also:  Attend all of your health care visits before delivery (prenatal visits) as recommended by your health care provider. This is important.  Prepare your home for your baby's  arrival. Make sure that you have: ? Diapers. ? Baby clothing. ? Feeding equipment. ? Safe sleeping arrangements for you and your baby.  Install a car seat in your vehicle. Have your car seat checked by a certified car seat installer to make sure that it is installed safely.  Think about who will help you with your new baby at home for at least the first several weeks after delivery.  What can I expect when I arrive at the birth center or hospital? Once you are in labor and have been admitted into the hospital or birth center, your health care provider may:  Review your pregnancy history and any concerns you have.  Insert an IV tube into one of your veins. This is used to give you fluids and medicines.  Check your blood pressure, pulse, temperature, and heart rate (vital signs).  Check whether your bag of water (amniotic sac) has broken (ruptured).  Talk with you about your birth plan and discuss pain control options.  Monitoring Your health care provider may monitor your contractions (uterine monitoring) and your baby's heart rate (fetal monitoring). You may need to be monitored:  Often, but not continuously (intermittently).  All the time or for long periods at a time (continuously). Continuous monitoring may be needed if: ? You are taking certain medicines, such as medicine to relieve pain or make your contractions stronger. ? You have pregnancy or labor complications.  Monitoring may be done by:  Placing a special stethoscope or a handheld monitoring device on your abdomen to   check your baby's heartbeat, and feeling your abdomen for contractions. This method of monitoring does not continuously record your baby's heartbeat or your contractions.  Placing monitors on your abdomen (external monitors) to record your baby's heartbeat and the frequency and length of contractions. You may not have to wear external monitors all the time.  Placing monitors inside of your uterus  (internal monitors) to record your baby's heartbeat and the frequency, length, and strength of your contractions. ? Your health care provider may use internal monitors if he or she needs more information about the strength of your contractions or your baby's heart rate. ? Internal monitors are put in place by passing a thin, flexible wire through your vagina and into your uterus. Depending on the type of monitor, it may remain in your uterus or on your baby's head until birth. ? Your health care provider will discuss the benefits and risks of internal monitoring with you and will ask for your permission before inserting the monitors.  Telemetry. This is a type of continuous monitoring that can be done with external or internal monitors. Instead of having to stay in bed, you are able to move around during telemetry. Ask your health care provider if telemetry is an option for you.  Physical exam Your health care provider may perform a physical exam. This may include:  Checking whether your baby is positioned: ? With the head toward your vagina (head-down). This is most common. ? With the head toward the top of your uterus (head-up or breech). If your baby is in a breech position, your health care provider may try to turn your baby to a head-down position so you can deliver vaginally. If it does not seem that your baby can be born vaginally, your provider may recommend surgery to deliver your baby. In rare cases, you may be able to deliver vaginally if your baby is head-up (breech delivery). ? Lying sideways (transverse). Babies that are lying sideways cannot be delivered vaginally.  Checking your cervix to determine: ? Whether it is thinning out (effacing). ? Whether it is opening up (dilating). ? How low your baby has moved into your birth canal.  What are the three stages of labor and delivery?  Normal labor and delivery is divided into the following three stages: Stage 1  Stage 1 is the  longest stage of labor, and it can last for hours or days. Stage 1 includes: ? Early labor. This is when contractions may be irregular, or regular and mild. Generally, early labor contractions are more than 10 minutes apart. ? Active labor. This is when contractions get longer, more regular, more frequent, and more intense. ? The transition phase. This is when contractions happen very close together, are very intense, and may last longer than during any other part of labor.  Contractions generally feel mild, infrequent, and irregular at first. They get stronger, more frequent (about every 2-3 minutes), and more regular as you progress from early labor through active labor and transition.  Many women progress through stage 1 naturally, but you may need help to continue making progress. If this happens, your health care provider may talk with you about: ? Rupturing your amniotic sac if it has not ruptured yet. ? Giving you medicine to help make your contractions stronger and more frequent.  Stage 1 ends when your cervix is completely dilated to 4 inches (10 cm) and completely effaced. This happens at the end of the transition phase. Stage 2  Once   your cervix is completely effaced and dilated to 4 inches (10 cm), you may start to feel an urge to push. It is common for the body to naturally take a rest before feeling the urge to push, especially if you received an epidural or certain other pain medicines. This rest period may last for up to 1-2 hours, depending on your unique labor experience.  During stage 2, contractions are generally less painful, because pushing helps relieve contraction pain. Instead of contraction pain, you may feel stretching and burning pain, especially when the widest part of your baby's head passes through the vaginal opening (crowning).  Your health care provider will closely monitor your pushing progress and your baby's progress through the vagina during stage 2.  Your  health care provider may massage the area of skin between your vaginal opening and anus (perineum) or apply warm compresses to your perineum. This helps it stretch as the baby's head starts to crown, which can help prevent perineal tearing. ? In some cases, an incision may be made in your perineum (episiotomy) to allow the baby to pass through the vaginal opening. An episiotomy helps to make the opening of the vagina larger to allow more room for the baby to fit through.  It is very important to breathe and focus so your health care provider can control the delivery of your baby's head. Your health care provider may have you decrease the intensity of your pushing, to help prevent perineal tearing.  After delivery of your baby's head, the shoulders and the rest of the body generally deliver very quickly and without difficulty.  Once your baby is delivered, the umbilical cord may be cut right away, or this may be delayed for 1-2 minutes, depending on your baby's health. This may vary among health care providers, hospitals, and birth centers.  If you and your baby are healthy enough, your baby may be placed on your chest or abdomen to help maintain the baby's temperature and to help you bond with each other. Some mothers and babies start breastfeeding at this time. Your health care team will dry your baby and help keep your baby warm during this time.  Your baby may need immediate care if he or she: ? Showed signs of distress during labor. ? Has a medical condition. ? Was born too early (prematurely). ? Had a bowel movement before birth (meconium). ? Shows signs of difficulty transitioning from being inside the uterus to being outside of the uterus. If you are planning to breastfeed, your health care team will help you begin a feeding. Stage 3  The third stage of labor starts immediately after the birth of your baby and ends after you deliver the placenta. The placenta is an organ that develops  during pregnancy to provide oxygen and nutrients to your baby in the womb.  Delivering the placenta may require some pushing, and you may have mild contractions. Breastfeeding can stimulate contractions to help you deliver the placenta.  After the placenta is delivered, your uterus should tighten (contract) and become firm. This helps to stop bleeding in your uterus. To help your uterus contract and to control bleeding, your health care provider may: ? Give you medicine by injection, through an IV tube, by mouth, or through your rectum (rectally). ? Massage your abdomen or perform a vaginal exam to remove any blood clots that are left in your uterus. ? Empty your bladder by placing a thin, flexible tube (catheter) into your bladder. ? Encourage   you to breastfeed your baby. After labor is over, you and your baby will be monitored closely to ensure that you are both healthy until you are ready to go home. Your health care team will teach you how to care for yourself and your baby. This information is not intended to replace advice given to you by your health care provider. Make sure you discuss any questions you have with your health care provider. Document Released: 10/07/2007 Document Revised: 07/18/2015 Document Reviewed: 01/12/2015 Elsevier Interactive Patient Education  2018 [employer]  

## 2017-08-16 ENCOUNTER — Other Ambulatory Visit: Payer: Self-pay

## 2017-08-16 ENCOUNTER — Inpatient Hospital Stay: Payer: Medicaid Other | Attending: Oncology | Admitting: Oncology

## 2017-08-16 ENCOUNTER — Encounter: Payer: Self-pay | Admitting: Oncology

## 2017-08-16 ENCOUNTER — Inpatient Hospital Stay: Payer: Medicaid Other

## 2017-08-16 ENCOUNTER — Observation Stay
Admission: EM | Admit: 2017-08-16 | Discharge: 2017-08-16 | Disposition: A | Discharge status: Home / Self Care | Payer: Medicaid Other | Attending: Obstetrics and Gynecology | Admitting: Obstetrics and Gynecology

## 2017-08-16 ENCOUNTER — Other Ambulatory Visit: Payer: Self-pay | Admitting: *Deleted

## 2017-08-16 VITALS — BP 111/66 | HR 107 | Temp 97.8°F | Resp 18 | Ht 62.0 in | Wt 142.2 lb

## 2017-08-16 VITALS — BP 100/68 | HR 58

## 2017-08-16 DIAGNOSIS — R5383 Other fatigue: Secondary | ICD-10-CM

## 2017-08-16 DIAGNOSIS — D509 Iron deficiency anemia, unspecified: Secondary | ICD-10-CM

## 2017-08-16 DIAGNOSIS — Z87891 Personal history of nicotine dependence: Secondary | ICD-10-CM

## 2017-08-16 DIAGNOSIS — Z3A36 36 weeks gestation of pregnancy: Secondary | ICD-10-CM | POA: Diagnosis not present

## 2017-08-16 DIAGNOSIS — Z6791 Unspecified blood type, Rh negative: Secondary | ICD-10-CM

## 2017-08-16 DIAGNOSIS — O99013 Anemia complicating pregnancy, third trimester: Secondary | ICD-10-CM

## 2017-08-16 DIAGNOSIS — Z3A38 38 weeks gestation of pregnancy: Secondary | ICD-10-CM | POA: Insufficient documentation

## 2017-08-16 DIAGNOSIS — E538 Deficiency of other specified B group vitamins: Secondary | ICD-10-CM

## 2017-08-16 DIAGNOSIS — O9989 Other specified diseases and conditions complicating pregnancy, childbirth and the puerperium: Secondary | ICD-10-CM

## 2017-08-16 DIAGNOSIS — Z885 Allergy status to narcotic agent status: Secondary | ICD-10-CM | POA: Diagnosis not present

## 2017-08-16 DIAGNOSIS — Z3480 Encounter for supervision of other normal pregnancy, unspecified trimester: Secondary | ICD-10-CM

## 2017-08-16 DIAGNOSIS — O26893 Other specified pregnancy related conditions, third trimester: Secondary | ICD-10-CM | POA: Diagnosis present

## 2017-08-16 DIAGNOSIS — M545 Low back pain: Secondary | ICD-10-CM

## 2017-08-16 DIAGNOSIS — O26899 Other specified pregnancy related conditions, unspecified trimester: Secondary | ICD-10-CM

## 2017-08-16 LAB — CBC
HCT: 30.8 % — ABNORMAL LOW (ref 35.0–47.0)
HEMOGLOBIN: 10.2 g/dL — AB (ref 12.0–16.0)
MCH: 28.8 pg (ref 26.0–34.0)
MCHC: 33.2 g/dL (ref 32.0–36.0)
MCV: 86.7 fL (ref 80.0–100.0)
Platelets: 189 10*3/uL (ref 150–440)
RBC: 3.55 MIL/uL — ABNORMAL LOW (ref 3.80–5.20)
RDW: 15.9 % — ABNORMAL HIGH (ref 11.5–14.5)
WBC: 11.9 10*3/uL — ABNORMAL HIGH (ref 3.6–11.0)

## 2017-08-16 LAB — FERRITIN: Ferritin: 9 ng/mL — ABNORMAL LOW (ref 11–307)

## 2017-08-16 LAB — IRON AND TIBC
Iron: 27 ug/dL — ABNORMAL LOW (ref 28–170)
SATURATION RATIOS: 6 % — AB (ref 10.4–31.8)
TIBC: 465 ug/dL — ABNORMAL HIGH (ref 250–450)
UIBC: 438 ug/dL

## 2017-08-16 LAB — VITAMIN B12: VITAMIN B 12: 470 pg/mL (ref 180–914)

## 2017-08-16 MED ORDER — IRON SUCROSE 20 MG/ML IV SOLN
200.0000 mg | INTRAVENOUS | Status: DC
  Administered 2017-08-16: 200 mg via INTRAVENOUS
  Filled 2017-08-16: qty 10

## 2017-08-16 MED ORDER — CYANOCOBALAMIN 1000 MCG/ML IJ SOLN
1000.0000 ug | Freq: Once | INTRAMUSCULAR | Status: AC
  Administered 2017-08-16: 1000 ug via INTRAMUSCULAR
  Filled 2017-08-16: qty 1

## 2017-08-16 MED ORDER — SODIUM CHLORIDE 0.9 % IV SOLN
Freq: Once | INTRAVENOUS | Status: AC
  Administered 2017-08-16: 15:00:00 via INTRAVENOUS
  Filled 2017-08-16: qty 1000

## 2017-08-16 NOTE — OB Triage Note (Signed)
Patient arrived in triage with c/o low back pain that is intermittent about every 7-8 mins that started at approx 2015. Also c/o pain that is constant starting in low back, radiating down left leg. Reports good fetal movement. Denies leaking of fluid or vaginal bleeding. Unsure of contractions. EFM applied and assessing.

## 2017-08-16 NOTE — Progress Notes (Signed)
Hematology/Oncology Consult note [facility]  [phone] [phone]  Patient Care Team: Mickey Farber, MD as PCP - General (Internal Medicine)   Name of the patient: Jennifer [name]  [id number]  1995/11/10   Date of visit: 08/16/17  Diagnosis- iron deficiency anemia in thrid trimester of pregnancy  Chief complaint/ Reason for visit- routine f/u of anemia  Heme/Onc history: patient is a 22 year old female with no significant past medical history.  She is currently [redacted] weeks pregnant.  This is her second pregnancy and her first pregnancy was uneventful.  She has been referred to [place] for iron deficiency.  Recent CBC on 06/14/2017 showed white count of 11.7, H&H of 9.8/29.2 with an MCV of 88 and a platelet count of 154.  Folate level was normal.  Ferritin level was low at 9.  Iron studies showed a low iron saturation of 12%.  B12 level was low normal at 281.  Patient currently feels fatigued but denies other complaints and her pregnancy is otherwise going well without any complications so far  Patient was seen by me at 32 weeks of pregnancy.  At that time of ferritin was low at 9. And iron studies showed a low iron saturation of 12%.  B12 levels were low normal at 281.  Plan was to give her 4 doses of Venofer and she was started on oral B12.  However patient missed 2 of her appointments and is only received 2 doses of Venofer so far  Interval history- she still feels fatigued. Denies other complaints  ECOG PS- 0 Pain scale- 0   Review of systems- Review of Systems  Constitutional: Positive for malaise/fatigue. Negative for chills, fever and weight loss.  HENT: Negative for congestion, ear discharge and nosebleeds.   Eyes: Negative for blurred vision.  Respiratory: Negative for cough, hemoptysis, sputum production, shortness of breath and wheezing.   Cardiovascular: Negative for chest pain, palpitations, orthopnea and claudication.    Gastrointestinal: Negative for abdominal pain, blood in stool, constipation, diarrhea, heartburn, melena, nausea and vomiting.  Genitourinary: Negative for dysuria, flank pain, frequency, hematuria and urgency.  Musculoskeletal: Negative for back pain, joint pain and myalgias.  Skin: Negative for rash.  Neurological: Negative for dizziness, tingling, focal weakness, seizures, weakness and headaches.  Endo/Heme/Allergies: Does not bruise/bleed easily.  Psychiatric/Behavioral: Negative for depression and suicidal ideas. The patient does not have insomnia.       Allergies  Allergen Reactions  . Vicodin [Hydrocodone-Acetaminophen] Itching     Past Medical History:  Diagnosis Date  . Anemia   . Environmental and seasonal allergies      Past Surgical History:  Procedure Laterality Date  . WISDOM TOOTH EXTRACTION     age 71 - all four    Social History   Socioeconomic History  . Marital status: Single    Spouse name: Not on file  . Number of children: 1  . Years of education: 9  . Highest education level: Not on file  Occupational History  . Occupation: UNEMPLOYED  Social Needs  . Financial resource strain: Not on file  . Food insecurity:    Worry: Not on file    Inability: Not on file  . Transportation needs:    Medical: Not on file    Non-medical: Not on file  Tobacco Use  . Smoking status: Former Smoker    Last attempt to quit: 04/28/2015    Years since quitting: 2.3  . Smokeless tobacco: Never Used  .  Tobacco comment: quit with positive pregnancy test  Substance and Sexual Activity  . Alcohol use: Yes    Alcohol/week: 3.6 oz    Types: 6 Shots of liquor per week    Comment: weekends  . Drug use: Yes    Types: Marijuana  . Sexual activity: Yes    Birth control/protection: None  Lifestyle  . Physical activity:    Days per week: Not on file    Minutes per session: Not on file  . Stress: Not on file  Relationships  . Social connections:    Talks on  phone: Not on file    Gets together: Not on file    Attends religious service: Not on file    Active member of club or organization: Not on file    Attends meetings of clubs or organizations: Not on file    Relationship status: Not on file  . Intimate partner violence:    Fear of current or ex partner: Not on file    Emotionally abused: Not on file    Physically abused: Not on file    Forced sexual activity: Not on file  Other Topics Concern  . Not on file  Social History Narrative  . Not on file    Family History  Problem Relation Age of Onset  . Hypertension Maternal Grandmother   . Breast cancer Other   . Cancer Other 74       lung     Current Outpatient Medications:  .  ferrous sulfate (FERROUSUL) 325 (65 FE) MG tablet, Take 1 tablet (325 mg total) by mouth daily with breakfast., Disp: 30 tablet, Rfl: 2 .  Prenat-Fe Carbonyl-FA-Omega 3 (ONE-A-DAY WOMENS PRENATAL 1) 28-0.8-235 MG CAPS, Take 1 capsule by mouth daily., Disp: 30 capsule, Rfl: 6  Physical exam:  Vitals:   08/16/17 1437  BP: 111/66  Pulse: (!) 107  Resp: 18  Temp: 97.8 F (36.6 C)  TempSrc: Tympanic  SpO2: 100%  Weight: 142 lb 3.2 oz (64.5 kg)  Height: 5\' 2"  (1.575 m)   Physical Exam  Constitutional: She is oriented to person, place, and time. She appears well-developed and well-nourished.  HENT:  Head: Normocephalic and atraumatic.  Eyes: Pupils are equal, round, and reactive to light. EOM are normal.  Neck: Normal range of motion.  Cardiovascular: Normal rate, regular rhythm and normal heart sounds.  Pulmonary/Chest: Effort normal and breath sounds normal.  Abdominal:  Gravid uterus  Musculoskeletal: She exhibits no edema.  Neurological: She is alert and oriented to person, place, and time.  Skin: Skin is warm and dry.     CMP Latest Ref Rng & Units 05/23/2016  Glucose 65 - 99 mg/dL 161(W107(H)  BUN 6 - 20 mg/dL 12  Creatinine 9.600.44 - 4.541.00 mg/dL 0.980.70  Sodium 119135 - 147145 mmol/L 137  Potassium 3.5  - 5.1 mmol/L 3.9  Chloride 101 - 111 mmol/L 100(L)  CO2 22 - 32 mmol/L 27  Calcium 8.9 - 10.3 mg/dL 9.3  Total Protein 6.5 - 8.1 g/dL 8.1  Total Bilirubin 0.3 - 1.2 mg/dL 0.7  Alkaline Phos 38 - 126 U/L 77  AST 15 - 41 U/L 28  ALT 14 - 54 U/L 21   CBC Latest Ref Rng & Units 08/16/2017  WBC 3.6 - 11.0 K/uL 11.9(H)  Hemoglobin 12.0 - 16.0 g/dL 10.2(L)  Hematocrit 35.0 - 47.0 % 30.8(L)  Platelets 150 - 440 K/uL 189     Assessment and plan- Patient is a 22 y.o. female  with iron deficiency anemia and third trimester of pregnancy  Patient is currently [redacted] weeks pregnant and is only received 2 doses of Venofer so far as she has missed appointments.  Her hemoglobin has not improved significantly.  Her labs still reveal ongoing iron deficiency.  We will plan to give her 1 dose of Venofer today and a second dose later this week and 1 dose next week, so she will receive 5 doses of Venofer hopefully prior to her delivery.  She will continue to take oral B12.  I will see her back in 2 months with repeat CBC ferritin and iron studies and B12 postdelivery   Visit Diagnosis 1. Iron deficiency anemia, unspecified iron deficiency anemia type   2. Anemia of pregnancy in third trimester      Dr. Owens Shark, MD, MPH [facility] [phone] 08/18/2017 10:03 AM

## 2017-08-16 NOTE — OB Triage Note (Signed)
Patient given verbal and written discharge instructions per AVS. Verbalized understanding and agreed to plan. Pt discharged, ambulatory, in stable condition, accompanied by mother and friend.

## 2017-08-16 NOTE — Discharge Instructions (Signed)
Return to hospital for increase in painful contractions, leaking of fluid, vaginal bleeding, or decreased fetal movement. Be sure to drink plenty of water, at least 8-10 glasses per day. May use tylenol, following package instructions, as needed for pain.

## 2017-08-16 NOTE — OB Triage Provider Note (Signed)
Triage Note  Jennifer [name] is an 22 y.o. female.  HPI: She presented to triage with complaints of lower back pain that was coming and going every 5-7 minutes. She has also developed shooting pain down her left leg. This pain has been present since 8pm. She has not tried any treatments. Nothing makes it better or worse. She reported to nursing that she has not had any water to drink today.  She deneis leakage of fluid. She denies vaginal bleeding. She reports good fetal movement.   Past Medical History:  Diagnosis Date  . Anemia   . Environmental and seasonal allergies     Past Surgical History:  Procedure Laterality Date  . WISDOM TOOTH EXTRACTION     age 89 - all four    Family History  Problem Relation Age of Onset  . Hypertension Maternal Grandmother   . Breast cancer Other   . Cancer Other 74       lung    Social History:  reports that she quit smoking about 2 years ago. She has never used smokeless tobacco. She reports that she drinks about 3.6 oz of alcohol per week. She reports that she has current or past drug history. Drug: Marijuana.  Allergies:  Allergies  Allergen Reactions  . Vicodin [Hydrocodone-Acetaminophen] Itching    Medications: I have reviewed the patient's current medications.  Results for orders placed or performed in visit on 08/16/17 (from the past 48 hour(s))  Ferritin     Status: Abnormal   Collection Time: 08/16/17  2:49 PM  Result Value Ref Range   Ferritin 9 (L) 11 - 307 ng/mL    Comment: Performed at [facility], [address] [place], [place] [address]    No results found.  Review of Systems  Constitutional: Negative for chills, fever, malaise/fatigue and weight loss.  HENT: Negative for congestion, hearing loss and sinus pain.   Eyes: Negative for blurred vision and double vision.  Respiratory: Negative for cough, sputum production, shortness of breath and wheezing.   Cardiovascular: Negative for chest pain,  palpitations, orthopnea and leg swelling.  Gastrointestinal: Negative for abdominal pain, constipation, diarrhea, nausea and vomiting.  Genitourinary: Negative for dysuria, flank pain, frequency, hematuria and urgency.  Musculoskeletal: Positive for back pain. Negative for falls and joint pain.  Skin: Negative for itching and rash.  Neurological: Negative for dizziness and headaches.  Psychiatric/Behavioral: Negative for depression, substance abuse and suicidal ideas. The patient is not nervous/anxious.    Blood pressure 106/64, pulse (!) 107, temperature 98.1 F (36.7 C), temperature source Oral, resp. rate 18, height 5\' 2"  (1.575 m), weight 142 lb (64.4 kg), last menstrual period 11/21/2016, not currently breastfeeding. Physical Exam  Bedside US showed cephalic infant, AFI 11cm. Normal placenta.   NST: 135 bpm baseline, moderate variability, 15x15 accelerations, none decelerations. Tocometer : irregular  Assessment/Plan: 22 yo with sciatic pain and irregular contractions, no signs of labor.  Discussed stretches for sciatic nerve pain. Advised adequate daily oral hydration to minimize contractions. She has an appointment on Friday. Will follow up at that time.    Jennifer [name] 08/16/2017, 10:53 PM

## 2017-08-16 NOTE — Discharge Summary (Signed)
Physician Discharge Summary   Patient ID: Jennifer [name] [id number] 22 y.o. 1995-07-21  Admit date: 08/16/2017  Discharge date and time: No discharge date for patient encounter.   Admitting Physician: Natale Milchhristanna R Aldine Chakraborty, MD   Discharge Physician: Adelene Idlerhristanna Simrah Chatham MD  Admission Diagnoses: 38 weeks preg back pain  Discharge Diagnoses: same as above  Admission Condition: good  Discharged Condition: good  Indication for Admission: Triage evaluation  Hospital Course: Was evaluated for labor, unchanged cervical exam from office, discharged home in stable condition.   Consults: None  Significant Diagnostic Studies: Bedside [place]  Treatments: oral hydration  Discharge Exam: BP 106/64 (BP Location: Left Arm)   Pulse 93   Temp 98.1 F (36.7 C) (Oral)   Resp 18   Ht 5\' 2"  (1.575 m)   Wt 142 lb (64.4 kg)   LMP 11/21/2016 (Exact Date)   BMI 25.97 kg/m   General Appearance:    Alert, cooperative, no distress, appears stated age  Head:    Normocephalic, without obvious abnormality, atraumatic  Eyes:    PERRL, conjunctiva/corneas clear, EOM's intact, fundi    benign, both eyes  Ears:    Normal TM's and external ear canals, both ears  Nose:   Nares normal, septum midline, mucosa normal, no drainage    or sinus tenderness  Throat:   Lips, mucosa, and tongue normal; teeth and gums normal  Neck:   Supple, symmetrical, trachea midline, no adenopathy;    thyroid:  no enlargement/tenderness/nodules; no carotid   bruit or JVD  Back:     Symmetric, no curvature, ROM normal, no CVA tenderness  Lungs:     Clear to auscultation bilaterally, respirations unlabored  Chest Wall:    No tenderness or deformity   Heart:    Regular rate and rhythm, S1 and S2 normal, no murmur, rub   or gallop  Breast Exam:    No tenderness, masses, or nipple abnormality  Abdomen:     Soft, non-tender, bowel sounds active all four quadrants,    no masses, no organomegaly  Genitalia:    Normal female without  lesion, discharge or tenderness  Rectal:    Normal tone, normal prostate, no masses or tenderness;   guaiac negative stool  Extremities:   Extremities normal, atraumatic, no cyanosis or edema  Pulses:   2+ and symmetric all extremities  Skin:   Skin color, texture, turgor normal, no rashes or lesions  Lymph nodes:   Cervical, supraclavicular, and axillary nodes normal  Neurologic:   CNII-XII intact, normal strength, sensation and reflexes    throughout    Disposition: Discharge disposition: 01-Home or Self Care       Patient Instructions:  Allergies as of 08/16/2017      Reactions   Vicodin [hydrocodone-acetaminophen] Itching      Medication List    TAKE these medications   ferrous sulfate 325 (65 FE) MG tablet Commonly known as:  FERROUSUL Take 1 tablet (325 mg total) by mouth daily with breakfast.   ONE-A-DAY WOMENS PRENATAL 1 28-0.8-235 MG Caps Take 1 capsule by mouth daily.      Activity: activity as tolerated Diet: regular diet Wound Care: none needed  Follow-up with [facility] in 3 days.  Signed: Natale MilchChristanna R Pamalee Marcoe 08/16/2017 11:14 PM

## 2017-08-16 NOTE — Progress Notes (Signed)
No new changes noted today 

## 2017-08-19 ENCOUNTER — Inpatient Hospital Stay: Payer: Medicaid Other

## 2017-08-19 ENCOUNTER — Ambulatory Visit (INDEPENDENT_AMBULATORY_CARE_PROVIDER_SITE_OTHER): Payer: Medicaid Other | Admitting: Obstetrics and Gynecology

## 2017-08-19 ENCOUNTER — Telehealth: Payer: Self-pay | Admitting: *Deleted

## 2017-08-19 VITALS — BP 94/48 | Wt 145.0 lb

## 2017-08-19 VITALS — BP 91/52 | HR 96 | Temp 98.8°F

## 2017-08-19 DIAGNOSIS — D509 Iron deficiency anemia, unspecified: Secondary | ICD-10-CM

## 2017-08-19 DIAGNOSIS — Z3A38 38 weeks gestation of pregnancy: Secondary | ICD-10-CM

## 2017-08-19 DIAGNOSIS — E538 Deficiency of other specified B group vitamins: Secondary | ICD-10-CM | POA: Diagnosis not present

## 2017-08-19 LAB — POCT URINALYSIS DIPSTICK OB
Glucose, UA: NEGATIVE — AB
POC,PROTEIN,UA: NEGATIVE

## 2017-08-19 MED ORDER — IRON SUCROSE 20 MG/ML IV SOLN
200.0000 mg | INTRAVENOUS | Status: DC
  Administered 2017-08-19: 200 mg via INTRAVENOUS
  Filled 2017-08-19: qty 10

## 2017-08-19 MED ORDER — SODIUM CHLORIDE 0.9 % IV SOLN
Freq: Once | INTRAVENOUS | Status: AC
  Administered 2017-08-19: 11:00:00 via INTRAVENOUS
  Filled 2017-08-19: qty 1000

## 2017-08-19 NOTE — Progress Notes (Signed)
ROB Constipation 

## 2017-08-19 NOTE — Progress Notes (Signed)
Routine Prenatal Care Visit  Subjective  Jennifer [name] is a 22 y.o. G2P1001 at 4517w5d being seen today for ongoing prenatal care.  She is currently monitored for the following issues for this low-risk pregnancy and has Supervision of other normal pregnancy, antepartum; ADHD (attention deficit hyperactivity disorder); Allergic rhinitis; Marijuana use; Rh negative state in antepartum period; Anemia, antepartum, third trimester; Iron deficiency anemia; Labor and delivery, indication for care; and Back pain affecting pregnancy on their problem list.  ----------------------------------------------------------------------------------- Patient reports no complaints.   Contractions: Irregular.  .  Movement: Present. Denies leaking of fluid.  ----------------------------------------------------------------------------------- The following portions of the patient's history were reviewed and updated as appropriate: allergies, current medications, past family history, past medical history, past social history, past surgical history and problem list. Problem list updated.   Objective  Blood pressure (!) 94/48, weight 145 lb (65.8 kg), last menstrual period 11/21/2016, not currently breastfeeding. Pregravid weight 113 lb (51.3 kg) Total Weight Gain 32 lb (14.5 kg) Urinalysis:      Fetal Status: Fetal Heart Rate (bpm): 135 Fundal Height: 37 cm Movement: Present  Presentation: Vertex  General:  Alert, oriented and cooperative. Patient is in no acute distress.  Skin: Skin is warm and dry. No rash noted.   Cardiovascular: Normal heart rate noted  Respiratory: Normal respiratory effort, no problems with respiration noted  Abdomen: Soft, gravid, appropriate for gestational age. Pain/Pressure: Present     Pelvic:  Cervical exam deferred Dilation: 3 Effacement (%): 50 Station: -2  Extremities: Normal range of motion.     ental Status: Normal mood and affect. Normal behavior. Normal judgment and thought  content.     Assessment   22 y.o. G2P1001 at 1717w5d by  08/28/2017, by Last Menstrual Period presenting for routine prenatal visit  Plan   pregnancy Problems (from 02/09/17 to present)    Problem Noted Resolved   Anemia, antepartum, third trimester 06/16/2017 by Vena AustriaStaebler, Lakima Dona, MD No   Rh negative state in antepartum period 05/09/2017 by Vena AustriaStaebler, Arrick Dutton, MD No   Overview Signed 05/09/2017  3:40 PM by Vena AustriaStaebler, Ileana Chalupa, MD    [ ]  rhogam 30 weeks      Supervision of other normal pregnancy, antepartum 02/09/2017 by Tresea MallGledhill, Jane, CNM No   Overview Addendum 06/28/2017  3:04 PM by Natale MilchSchuman, Christanna R, MD    Clinic [facility] Prenatal Labs  Dating LMP = 11 week US Blood type: B/Negative/-- (01/30 1131)   Genetic Screen Declined Antibody:Negative (01/30 1131)  Anatomic US Normal Rubella: 0.95 (01/30 1131) Varicella: Non-immune  GTT 110 RPR: Non Reactive (01/30 1131)   Rhogam [X]  30 weeks HBsAg: Negative (01/30 1131)   TDaP vaccine 06/28/17                       Flu Shot:  HIV: Non Reactive (01/30 1131)   Baby Food Bottle                               GBS:   Contraception  undecided, given information Pap: N/A <21  CBB     CS/VBAC NA   Support Person Boyfriend [name]               Gestational age appropriate obstetric precautions including but not limited to vaginal bleeding, contractions, leaking of fluid and fetal movement were reviewed in detail with the patient.    Return in about 1 week (around 08/26/2017)  for ROB.  Vena Austria, MD, [facility], [facility]

## 2017-08-19 NOTE — Telephone Encounter (Signed)
Called pt to  Let her know that she needs 5th dose of venofer and she is ok with tues 8/13 at 1:30. Pt agreeable to the last dose.

## 2017-08-21 ENCOUNTER — Observation Stay
Admission: EM | Admit: 2017-08-21 | Discharge: 2017-08-21 | Disposition: A | Discharge status: Home / Self Care | Payer: Medicaid Other | Attending: Obstetrics and Gynecology | Admitting: Obstetrics and Gynecology

## 2017-08-21 ENCOUNTER — Other Ambulatory Visit: Payer: Self-pay

## 2017-08-21 DIAGNOSIS — O9989 Other specified diseases and conditions complicating pregnancy, childbirth and the puerperium: Secondary | ICD-10-CM | POA: Diagnosis present

## 2017-08-21 DIAGNOSIS — M549 Dorsalgia, unspecified: Secondary | ICD-10-CM

## 2017-08-21 DIAGNOSIS — Z3A39 39 weeks gestation of pregnancy: Secondary | ICD-10-CM | POA: Insufficient documentation

## 2017-08-21 DIAGNOSIS — Z87891 Personal history of nicotine dependence: Secondary | ICD-10-CM | POA: Insufficient documentation

## 2017-08-21 DIAGNOSIS — Z885 Allergy status to narcotic agent status: Secondary | ICD-10-CM | POA: Insufficient documentation

## 2017-08-21 DIAGNOSIS — O471 False labor at or after 37 completed weeks of gestation: Secondary | ICD-10-CM | POA: Diagnosis not present

## 2017-08-21 DIAGNOSIS — O99891 Other specified diseases and conditions complicating pregnancy: Secondary | ICD-10-CM

## 2017-08-21 MED ORDER — LACTATED RINGERS IV SOLN: INTRAVENOUS | Status: DC

## 2017-08-21 MED ORDER — LACTATED RINGERS IV SOLN: 500.0000 mL | INTRAVENOUS | Status: DC | PRN

## 2017-08-21 MED ORDER — OXYTOCIN BOLUS FROM INFUSION: 500.0000 mL | Freq: Once | INTRAVENOUS | Status: DC

## 2017-08-21 MED ORDER — OXYTOCIN 40 UNITS IN LACTATED RINGERS INFUSION - SIMPLE MED: 2.5000 [IU]/h | INTRAVENOUS | Status: DC

## 2017-08-21 NOTE — Final Progress Note (Signed)
Physician Final Progress Note  Patient ID: Jennifer [name] MRN: [id number] DOB/AGE: [birth year] 22 y.o.  Admit date: 08/21/2017 Admitting provider: Conard Novak, MD Discharge date: 08/21/2017   Admission Diagnoses:  1) intrauterine pregnancy at [redacted]w[redacted]d  2) back pain, concern for labor  Discharge Diagnoses:  1) intrauterine pregnancy at [redacted]w[redacted]d  2) back pain, concern for labor - false labor  History of Present Illness: The patient is a 22 y.o. female G2P1001 at [redacted]w[redacted]d who presents for back pain and contractions all day.  She has not timed the contractions. She notes +FM, no LOF, no vaginal bleeding. She has had some wetness in her undergarments, but has had no gush of fluid and no continued trickle of fluid.    Hospital Course: the patient was admitted to labor and delivery for observation.  Her vital signs were normal. The fetal tracing was reactive. Her cervical exam, performed by the RN, was not different from her most recent clinical visit a couple of days ago.  She had negative ferning and pooling.  She was discharged in stable condition, not in labor with intact membranes. She was given the usual 3rd trimester precautions.    Past Medical History:  Diagnosis Date  . Anemia   . Environmental and seasonal allergies     Past Surgical History:  Procedure Laterality Date  . WISDOM TOOTH EXTRACTION     age 65 - all four    No current facility-administered medications on file prior to encounter.    Current Outpatient Medications on File Prior to Encounter  Medication Sig Dispense Refill  . ferrous sulfate (FERROUSUL) 325 (65 FE) MG tablet Take 1 tablet (325 mg total) by mouth daily with breakfast. (Patient not taking: Reported on 08/16/2017) 30 tablet 2  . Prenat-Fe Carbonyl-FA-Omega 3 (ONE-A-DAY WOMENS PRENATAL 1) 28-0.8-235 MG CAPS Take 1 capsule by mouth daily. (Patient not taking: Reported on 08/16/2017) 30 capsule 6    Allergies  Allergen Reactions  . Vicodin  [Hydrocodone-Acetaminophen] Itching    Social History   Socioeconomic History  . Marital status: Single    Spouse name: Not on file  . Number of children: 1  . Years of education: 48  . Highest education level: Not on file  Occupational History  . Occupation: UNEMPLOYED  Social Needs  . Financial resource strain: Not on file  . Food insecurity:    Worry: Not on file    Inability: Not on file  . Transportation needs:    Medical: Not on file    Non-medical: Not on file  Tobacco Use  . Smoking status: Former Smoker    Last attempt to quit: 04/28/2015    Years since quitting: 2.3  . Smokeless tobacco: Never Used  . Tobacco comment: quit with positive pregnancy test  Substance and Sexual Activity  . Alcohol use: Yes    Alcohol/week: 6.0 standard drinks    Types: 6 Shots of liquor per week    Comment: weekends  . Drug use: Yes    Types: Marijuana    Comment: last use yesterday  . Sexual activity: Yes    Birth control/protection: None  Lifestyle  . Physical activity:    Days per week: Not on file    Minutes per session: Not on file  . Stress: Not on file  Relationships  . Social connections:    Talks on phone: Not on file    Gets together: Not on file    Attends religious service: Not on file  Active member of club or organization: Not on file    Attends meetings of clubs or organizations: Not on file    Relationship status: Not on file  . Intimate partner violence:    Fear of current or ex partner: Not on file    Emotionally abused: Not on file    Physically abused: Not on file    Forced sexual activity: Not on file  Other Topics Concern  . Not on file  Social History Narrative  . Not on file   Review of Systems  Constitutional: Negative.   HENT: Negative.   Eyes: Negative.   Respiratory: Negative.   Cardiovascular: Negative.   Gastrointestinal: Positive for abdominal pain (see HPI). Negative for blood in stool, constipation, diarrhea, heartburn, melena,  nausea and vomiting.  Genitourinary: Negative.   Musculoskeletal: Positive for back pain (see HPI). Negative for falls, joint pain, myalgias and neck pain.  Skin: Negative.   Neurological: Negative.   Psychiatric/Behavioral: Negative.      Physical Exam: LMP 11/21/2016 (Exact Date)   Gen: NAD CV: RRR Pulm: CTAB Pelvic: (female chaperone present) no pooling, mucus present.  Cervix per RN ~3-3.5 cm.  Ext: no e/c/t  Consults: None  Significant Findings/ Diagnostic Studies:  Ferning negative  Procedures: NST Baseline FHR: 135 beats/min Variability: moderate Accelerations: present Decelerations: absent Tocometry: quiet  Interpretation:  INDICATIONS: rule out uterine contractions RESULTS:  A NST procedure was performed with FHR monitoring and a normal baseline established, appropriate time of 20-40 minutes of evaluation, and accels >2 seen w 15x15 characteristics.  Results show a REACTIVE NST.    Discharge Condition: stable  Disposition: Discharge disposition: 01-Home or Self Care       Diet: Regular diet  Discharge Activity: Activity as tolerated   Allergies as of 08/21/2017      Reactions   Vicodin [hydrocodone-acetaminophen] Itching      Medication List    TAKE these medications   ferrous sulfate 325 (65 FE) MG tablet Take 1 tablet (325 mg total) by mouth daily with breakfast.   ONE-A-DAY WOMENS PRENATAL 1 28-0.8-235 MG Caps Take 1 capsule by mouth daily.        Total time spent taking care of this patient: 30 minutes  Signed: Thomasene MohairStephen Liron Eissler, MD  08/21/2017, 10:06 PM

## 2017-08-21 NOTE — OB Triage Note (Signed)
C/O back pain and cramping that started 8/10, pain 5/10. Small leaking of fluid/mucous as well.  No bleeding.

## 2017-08-22 ENCOUNTER — Inpatient Hospital Stay: Payer: Medicaid Other

## 2017-08-22 NOTE — Discharge Summary (Signed)
See Final Progress note 

## 2017-08-23 ENCOUNTER — Inpatient Hospital Stay: Payer: Medicaid Other

## 2017-08-23 VITALS — BP 95/63 | HR 107 | Resp 20

## 2017-08-23 DIAGNOSIS — E538 Deficiency of other specified B group vitamins: Secondary | ICD-10-CM | POA: Diagnosis not present

## 2017-08-23 DIAGNOSIS — O99013 Anemia complicating pregnancy, third trimester: Secondary | ICD-10-CM

## 2017-08-23 MED ORDER — IRON SUCROSE 20 MG/ML IV SOLN
200.0000 mg | Freq: Once | INTRAVENOUS | Status: AC
  Administered 2017-08-23: 200 mg via INTRAVENOUS
  Filled 2017-08-23: qty 10

## 2017-08-25 ENCOUNTER — Observation Stay
Admission: EM | Admit: 2017-08-25 | Discharge: 2017-08-25 | Disposition: A | Discharge status: Home / Self Care | Payer: Medicaid Other | Attending: Obstetrics and Gynecology | Admitting: Obstetrics and Gynecology

## 2017-08-25 ENCOUNTER — Encounter: Payer: Self-pay | Admitting: *Deleted

## 2017-08-25 DIAGNOSIS — O471 False labor at or after 37 completed weeks of gestation: Principal | ICD-10-CM | POA: Insufficient documentation

## 2017-08-25 DIAGNOSIS — Z3A39 39 weeks gestation of pregnancy: Secondary | ICD-10-CM | POA: Insufficient documentation

## 2017-08-25 MED ORDER — ACETAMINOPHEN 325 MG PO TABS: 650.0000 mg | ORAL_TABLET | ORAL | Status: DC | PRN

## 2017-08-25 NOTE — Discharge Summary (Signed)
See final progress note. 

## 2017-08-25 NOTE — Final Progress Note (Signed)
Physician Final Progress Note  Patient ID: Jennifer [name] MRN: [id number] DOB/AGE: 02-08-1995 22 y.o.  Admit date: 08/25/2017 Admitting provider: Vena AustriaAndreas Corbyn Steedman, MD Discharge date: 08/25/2017   Admission Diagnoses: Contractions  Discharge Diagnoses:  Active Problems:   Labor and delivery indication for care or intervention  22 year old G2P1001 at 1053w4d presenting with contractions.  Cervix unchanged from prior checks earlier this week and unchanged after 2-hr recheck.  +FM, no LOF, no VB  Temp:  [98.2 F (36.8 C)] 98.2 F (36.8 C) (08/15 2029) Pulse Rate:  [112] 112 (08/15 2029) Resp:  [18] 18 (08/15 2029) BP: (118)/(62) 118/62 (08/15 2029) Weight:  [67.1 kg] 67.1 kg (08/15 2037)  Baseline: 130 Variability: moderate Accelerations: present Decelerations: absent Tocometry: none The patient was monitored for 30 minutes, fetal heart rate tracing was deemed reactive, category I tracing,  CPT [id number]   Consults: None  Significant Findings/ Diagnostic Studies: none  Procedures: NST  Discharge Condition: good  Disposition: Discharge disposition: 01-Home or Self Care       Diet: Regular diet  Discharge Activity: Activity as tolerated  Discharge Instructions    Discharge activity:  No Restrictions   Complete by:  As directed    Discharge diet:  No restrictions   Complete by:  As directed    Fetal Kick Count:  Lie on our left side for one hour after a meal, and count the number of times your baby kicks.  If it is less than 5 times, get up, move around and drink some juice.  Repeat the test 30 minutes later.  If it is still less than 5 kicks in an hour, notify your doctor.   Complete by:  As directed    LABOR:  When conractions begin, you should start to time them from the beginning of one contraction to the beginning  of the next.  When contractions are 5 - 10 minutes apart or less and have been regular for at least an hour, you should call your health care provider.    Complete by:  As directed    No sexual activity restrictions   Complete by:  As directed    Notify physician for bleeding from the vagina   Complete by:  As directed    Notify physician for blurring of vision or spots before the eyes   Complete by:  As directed    Notify physician for chills or fever   Complete by:  As directed    Notify physician for fainting spells, "black outs" or loss of consciousness   Complete by:  As directed    Notify physician for increase in vaginal discharge   Complete by:  As directed    Notify physician for leaking of fluid   Complete by:  As directed    Notify physician for pain or burning when urinating   Complete by:  As directed    Notify physician for pelvic pressure (sudden increase)   Complete by:  As directed    Notify physician for severe or continued nausea or vomiting   Complete by:  As directed    Notify physician for sudden gushing of fluid from the vagina (with or without continued leaking)   Complete by:  As directed    Notify physician for sudden, constant, or occasional abdominal pain   Complete by:  As directed    Notify physician if baby moving less than usual   Complete by:  As directed      Allergies  as of 08/25/2017      Reactions   Vicodin [hydrocodone-acetaminophen] Itching      Medication List    TAKE these medications   ferrous sulfate 325 (65 FE) MG tablet Take 1 tablet (325 mg total) by mouth daily with breakfast.   ONE-A-DAY WOMENS PRENATAL 1 28-0.8-235 MG Caps Take 1 capsule by mouth daily.   vitamin B-12 250 MCG tablet Commonly known as:  CYANOCOBALAMIN Take 250 mcg by mouth daily.        Total time spent taking care of this patient: 30 minutes  Signed: Vena Austriandreas Kimbly Eanes 08/25/2017, 10:27 PM

## 2017-08-26 ENCOUNTER — Ambulatory Visit (INDEPENDENT_AMBULATORY_CARE_PROVIDER_SITE_OTHER): Payer: Medicaid Other | Admitting: Obstetrics and Gynecology

## 2017-08-26 ENCOUNTER — Encounter: Payer: Self-pay | Admitting: Obstetrics and Gynecology

## 2017-08-26 VITALS — BP 98/60 | Wt 142.0 lb

## 2017-08-26 DIAGNOSIS — O9989 Other specified diseases and conditions complicating pregnancy, childbirth and the puerperium: Secondary | ICD-10-CM

## 2017-08-26 DIAGNOSIS — M549 Dorsalgia, unspecified: Secondary | ICD-10-CM

## 2017-08-26 DIAGNOSIS — O99013 Anemia complicating pregnancy, third trimester: Secondary | ICD-10-CM

## 2017-08-26 DIAGNOSIS — Z3480 Encounter for supervision of other normal pregnancy, unspecified trimester: Secondary | ICD-10-CM

## 2017-08-26 DIAGNOSIS — Z3A39 39 weeks gestation of pregnancy: Secondary | ICD-10-CM

## 2017-08-26 NOTE — Progress Notes (Signed)
Routine Prenatal Care Visit  Subjective  Jennifer [name] is a 22 y.o. G2P1001 at 6894w5d being seen today for ongoing prenatal care.  She is currently monitored for the following issues for this low-risk pregnancy and has Supervision of other normal pregnancy, antepartum; ADHD (attention deficit hyperactivity disorder); Allergic rhinitis; Marijuana use; Rh negative state in antepartum period; Anemia, antepartum, third trimester; Iron deficiency anemia; Labor and delivery, indication for care; Back pain affecting pregnancy; and Labor and delivery indication for care or intervention on their problem list.  ----------------------------------------------------------------------------------- Patient reports no complaints.   Contractions: Not present. Vag. Bleeding: None.  Movement: Present. Denies leaking of fluid.  ----------------------------------------------------------------------------------- The following portions of the patient's history were reviewed and updated as appropriate: allergies, current medications, past family history, past medical history, past social history, past surgical history and problem list. Problem list updated.   Objective  Blood pressure 98/60, weight 142 lb (64.4 kg), last menstrual period 11/21/2016, not currently breastfeeding. Pregravid weight 113 lb (51.3 kg) Total Weight Gain 29 lb (13.2 kg) Urinalysis:      Fetal Status: Fetal Heart Rate (bpm): 133 Fundal Height: 38 cm Movement: Present     General:  Alert, oriented and cooperative. Patient is in no acute distress.  Skin: Skin is warm and dry. No rash noted.   Cardiovascular: Normal heart rate noted  Respiratory: Normal respiratory effort, no problems with respiration noted  Abdomen: Soft, gravid, appropriate for gestational age. Pain/Pressure: Present     Pelvic:  Cervical exam performed Dilation: 3 Effacement (%): 50 Station: -2 Membrane sweep at maternal request.   Extremities: Normal range of motion.      ental Status: Normal mood and affect. Normal behavior. Normal judgment and thought content.     Assessment   22 y.o. G2P1001 at 5494w5d by  08/28/2017, by Last Menstrual Period presenting for routine prenatal visit  Plan   pregnancy Problems (from 02/09/17 to present)    Problem Noted Resolved   Anemia, antepartum, third trimester 06/16/2017 by Vena AustriaStaebler, Andreas, MD No   Rh negative state in antepartum period 05/09/2017 by Vena AustriaStaebler, Andreas, MD No   Overview Signed 05/09/2017  3:40 PM by Vena AustriaStaebler, Andreas, MD    [ ]  rhogam 30 weeks      Supervision of other normal pregnancy, antepartum 02/09/2017 by Tresea MallGledhill, Jane, CNM No   Overview Addendum 06/28/2017  3:04 PM by Natale MilchSchuman, Aubreana Cornacchia R, MD    Clinic [facility] Prenatal Labs  Dating LMP = 11 week US Blood type: B/Negative/-- (01/30 1131)   Genetic Screen Declined Antibody:Negative (01/30 1131)  Anatomic US Normal Rubella: 0.95 (01/30 1131) Varicella: Non-immune  GTT 110 RPR: Non Reactive (01/30 1131)   Rhogam [X]  30 weeks HBsAg: Negative (01/30 1131)   TDaP vaccine 06/28/17                       Flu Shot:  HIV: Non Reactive (01/30 1131)   Baby Food Bottle                               GBS: negative  Contraception  undecided, given information Pap: N/A <21  CBB     CS/VBAC NA   Support Person Boyfriend [name]               Gestational age appropriate obstetric precautions including but not limited to vaginal bleeding, contractions, leaking of fluid and fetal movement were reviewed  in detail with the patient.    Membranes swept at maternal request.  IOL scheduled for 09/03/17 at 8am  Return in about 1 week (around 09/02/2017) for ROB.  Adelene Idlerhristanna Leonela Kivi MD [facility], [facility] 08/26/17 3:52 PM

## 2017-08-26 NOTE — Progress Notes (Signed)
ROB C/o pain/pressure/some leaking of clear fluid  Desires cervical check

## 2017-08-27 ENCOUNTER — Inpatient Hospital Stay
Admission: EM | Admit: 2017-08-27 | Discharge: 2017-08-29 | DRG: 805 | Disposition: A | Discharge status: Home / Self Care | Payer: Medicaid Other | Attending: Obstetrics and Gynecology | Admitting: Obstetrics and Gynecology

## 2017-08-27 ENCOUNTER — Encounter: Payer: Self-pay | Admitting: *Deleted

## 2017-08-27 ENCOUNTER — Inpatient Hospital Stay: Payer: Medicaid Other | Admitting: Anesthesiology

## 2017-08-27 ENCOUNTER — Other Ambulatory Visit: Payer: Self-pay

## 2017-08-27 DIAGNOSIS — O411230 Chorioamnionitis, third trimester, not applicable or unspecified: Secondary | ICD-10-CM | POA: Diagnosis present

## 2017-08-27 DIAGNOSIS — O26899 Other specified pregnancy related conditions, unspecified trimester: Secondary | ICD-10-CM

## 2017-08-27 DIAGNOSIS — Z6791 Unspecified blood type, Rh negative: Secondary | ICD-10-CM

## 2017-08-27 DIAGNOSIS — O26893 Other specified pregnancy related conditions, third trimester: Secondary | ICD-10-CM | POA: Diagnosis present

## 2017-08-27 DIAGNOSIS — F1290 Cannabis use, unspecified, uncomplicated: Secondary | ICD-10-CM | POA: Diagnosis present

## 2017-08-27 DIAGNOSIS — O9902 Anemia complicating childbirth: Secondary | ICD-10-CM

## 2017-08-27 DIAGNOSIS — D62 Acute posthemorrhagic anemia: Secondary | ICD-10-CM | POA: Diagnosis not present

## 2017-08-27 DIAGNOSIS — Z3A39 39 weeks gestation of pregnancy: Secondary | ICD-10-CM

## 2017-08-27 DIAGNOSIS — O4292 Full-term premature rupture of membranes, unspecified as to length of time between rupture and onset of labor: Principal | ICD-10-CM | POA: Diagnosis present

## 2017-08-27 DIAGNOSIS — O4202 Full-term premature rupture of membranes, onset of labor within 24 hours of rupture: Secondary | ICD-10-CM | POA: Diagnosis not present

## 2017-08-27 DIAGNOSIS — Z3480 Encounter for supervision of other normal pregnancy, unspecified trimester: Secondary | ICD-10-CM

## 2017-08-27 DIAGNOSIS — O9081 Anemia of the puerperium: Secondary | ICD-10-CM | POA: Diagnosis not present

## 2017-08-27 DIAGNOSIS — O99324 Drug use complicating childbirth: Secondary | ICD-10-CM | POA: Diagnosis present

## 2017-08-27 DIAGNOSIS — O99013 Anemia complicating pregnancy, third trimester: Secondary | ICD-10-CM

## 2017-08-27 DIAGNOSIS — Z87891 Personal history of nicotine dependence: Secondary | ICD-10-CM | POA: Diagnosis not present

## 2017-08-27 DIAGNOSIS — R300 Dysuria: Secondary | ICD-10-CM | POA: Diagnosis present

## 2017-08-27 DIAGNOSIS — O4290 Premature rupture of membranes, unspecified as to length of time between rupture and onset of labor, unspecified weeks of gestation: Secondary | ICD-10-CM | POA: Diagnosis present

## 2017-08-27 DIAGNOSIS — Z5321 Procedure and treatment not carried out due to patient leaving prior to being seen by health care provider: Secondary | ICD-10-CM | POA: Diagnosis not present

## 2017-08-27 LAB — URINE DRUG SCREEN, QUALITATIVE (ARMC ONLY)
AMPHETAMINES, UR SCREEN: NOT DETECTED
Barbiturates, Ur Screen: NOT DETECTED
Cannabinoid 50 Ng, Ur ~~LOC~~: POSITIVE — AB
Cocaine Metabolite,Ur ~~LOC~~: NOT DETECTED
MDMA (Ecstasy)Ur Screen: NOT DETECTED
METHADONE SCREEN, URINE: NOT DETECTED
Opiate, Ur Screen: NOT DETECTED
Phencyclidine (PCP) Ur S: NOT DETECTED
Tricyclic, Ur Screen: NOT DETECTED

## 2017-08-27 LAB — CBC
HCT: 34.2 % — ABNORMAL LOW (ref 35.0–47.0)
Hemoglobin: 11.3 g/dL — ABNORMAL LOW (ref 12.0–16.0)
MCH: 29 pg (ref 26.0–34.0)
MCHC: 33.2 g/dL (ref 32.0–36.0)
MCV: 87.5 fL (ref 80.0–100.0)
PLATELETS: 174 10*3/uL (ref 150–440)
RBC: 3.91 MIL/uL (ref 3.80–5.20)
RDW: 17.6 % — ABNORMAL HIGH (ref 11.5–14.5)
WBC: 20.5 10*3/uL — ABNORMAL HIGH (ref 3.6–11.0)

## 2017-08-27 MED ORDER — DIPHENHYDRAMINE HCL 50 MG/ML IJ SOLN: 12.5000 mg | INTRAMUSCULAR | Status: DC | PRN

## 2017-08-27 MED ORDER — TETANUS-DIPHTH-ACELL PERTUSSIS 5-2.5-18.5 LF-MCG/0.5 IM SUSP
0.5000 mL | Freq: Once | INTRAMUSCULAR | Status: DC
  Filled 2017-08-27: qty 0.5

## 2017-08-27 MED ORDER — LIDOCAINE HCL (PF) 1 % IJ SOLN
INTRAMUSCULAR | Status: DC | PRN
  Administered 2017-08-27: 3 mL

## 2017-08-27 MED ORDER — LACTATED RINGERS IV SOLN
INTRAVENOUS | Status: DC
  Administered 2017-08-27 (×4): via INTRAVENOUS

## 2017-08-27 MED ORDER — ONDANSETRON HCL 4 MG/2ML IJ SOLN
4.0000 mg | Freq: Four times a day (QID) | INTRAMUSCULAR | Status: DC | PRN
  Administered 2017-08-27: 4 mg via INTRAVENOUS
  Filled 2017-08-27 (×2): qty 2

## 2017-08-27 MED ORDER — ACETAMINOPHEN 325 MG PO TABS
ORAL_TABLET | ORAL | Status: AC
  Filled 2017-08-27: qty 2

## 2017-08-27 MED ORDER — SODIUM CHLORIDE 0.9 % IV SOLN
2.0000 g | Freq: Four times a day (QID) | INTRAVENOUS | Status: DC
  Administered 2017-08-27 – 2017-08-28 (×2): 2 g via INTRAVENOUS
  Filled 2017-08-27 (×4): qty 2000

## 2017-08-27 MED ORDER — OXYTOCIN 10 UNIT/ML IJ SOLN
INTRAMUSCULAR | Status: AC
  Filled 2017-08-27: qty 2

## 2017-08-27 MED ORDER — PRENATAL MULTIVITAMIN CH
1.0000 | ORAL_TABLET | Freq: Every day | ORAL | Status: DC
  Administered 2017-08-28 – 2017-08-29 (×2): 1 via ORAL
  Filled 2017-08-27 (×2): qty 1

## 2017-08-27 MED ORDER — OXYTOCIN BOLUS FROM INFUSION
500.0000 mL | Freq: Once | INTRAVENOUS | Status: AC
  Administered 2017-08-27: 500 mL via INTRAVENOUS

## 2017-08-27 MED ORDER — OXYTOCIN 40 UNITS IN LACTATED RINGERS INFUSION - SIMPLE MED: 2.5000 [IU]/h | INTRAVENOUS | Status: DC

## 2017-08-27 MED ORDER — MEASLES, MUMPS & RUBELLA VAC ~~LOC~~ INJ
0.5000 mL | INJECTION | Freq: Once | SUBCUTANEOUS | Status: AC
  Administered 2017-08-29: 0.5 mL via SUBCUTANEOUS
  Filled 2017-08-27 (×2): qty 0.5

## 2017-08-27 MED ORDER — PHENYLEPHRINE 40 MCG/ML (10ML) SYRINGE FOR IV PUSH (FOR BLOOD PRESSURE SUPPORT)
80.0000 ug | PREFILLED_SYRINGE | INTRAVENOUS | Status: AC | PRN
  Administered 2017-08-27: 150 ug via INTRAVENOUS
  Administered 2017-08-27 (×2): 100 ug via INTRAVENOUS

## 2017-08-27 MED ORDER — OXYCODONE HCL 5 MG PO TABS
10.0000 mg | ORAL_TABLET | ORAL | Status: DC | PRN
  Administered 2017-08-28: 10 mg via ORAL

## 2017-08-27 MED ORDER — MISOPROSTOL 200 MCG PO TABS
ORAL_TABLET | ORAL | Status: AC
Start: 2017-08-27 — End: 2017-08-28
  Filled 2017-08-27: qty 4

## 2017-08-27 MED ORDER — LACTATED RINGERS IV SOLN: 500.0000 mL | INTRAVENOUS | Status: DC | PRN

## 2017-08-27 MED ORDER — LIDOCAINE HCL (PF) 1 % IJ SOLN
INTRAMUSCULAR | Status: AC
  Filled 2017-08-27: qty 30

## 2017-08-27 MED ORDER — CEFAZOLIN SODIUM-DEXTROSE 2-4 GM/100ML-% IV SOLN
2.0000 g | Freq: Once | INTRAVENOUS | Status: AC
  Administered 2017-08-27: 2 g via INTRAVENOUS
  Filled 2017-08-27: qty 100

## 2017-08-27 MED ORDER — COCONUT OIL OIL: 1.0000 "application " | TOPICAL_OIL | Status: DC | PRN

## 2017-08-27 MED ORDER — VARICELLA VIRUS VACCINE LIVE 1350 PFU/0.5ML IJ SUSR
0.5000 mL | Freq: Once | INTRAMUSCULAR | Status: AC
  Administered 2017-08-29: 0.5 mL via SUBCUTANEOUS
  Filled 2017-08-27 (×2): qty 0.5

## 2017-08-27 MED ORDER — FENTANYL 2.5 MCG/ML W/ROPIVACAINE 0.15% IN NS 100 ML EPIDURAL (ARMC)
12.0000 mL/h | EPIDURAL | Status: DC
  Administered 2017-08-27: 12 ug via EPIDURAL

## 2017-08-27 MED ORDER — ONDANSETRON HCL 4 MG PO TABS: 4.0000 mg | ORAL_TABLET | ORAL | Status: DC | PRN

## 2017-08-27 MED ORDER — ONDANSETRON HCL 4 MG/2ML IJ SOLN: 4.0000 mg | INTRAMUSCULAR | Status: DC | PRN

## 2017-08-27 MED ORDER — LACTATED RINGERS IV SOLN: 500.0000 mL | Freq: Once | INTRAVENOUS | Status: DC

## 2017-08-27 MED ORDER — LIDOCAINE-EPINEPHRINE (PF) 1.5 %-1:200000 IJ SOLN
INTRAMUSCULAR | Status: DC | PRN
  Administered 2017-08-27: 3 mL via PERINEURAL

## 2017-08-27 MED ORDER — BENZOCAINE-MENTHOL 20-0.5 % EX AERO
1.0000 "application " | INHALATION_SPRAY | CUTANEOUS | Status: DC | PRN
  Filled 2017-08-27: qty 56

## 2017-08-27 MED ORDER — IBUPROFEN 600 MG PO TABS
600.0000 mg | ORAL_TABLET | Freq: Four times a day (QID) | ORAL | Status: DC
  Administered 2017-08-27 – 2017-08-29 (×7): 600 mg via ORAL
  Filled 2017-08-27 (×7): qty 1

## 2017-08-27 MED ORDER — ACETAMINOPHEN 325 MG PO TABS
650.0000 mg | ORAL_TABLET | ORAL | Status: DC | PRN
  Administered 2017-08-27 – 2017-08-28 (×2): 650 mg via ORAL
  Filled 2017-08-27: qty 2

## 2017-08-27 MED ORDER — FENTANYL 2.5 MCG/ML W/ROPIVACAINE 0.15% IN NS 100 ML EPIDURAL (ARMC)
EPIDURAL | Status: AC
  Administered 2017-08-27: 12 ug via EPIDURAL
  Filled 2017-08-27: qty 100

## 2017-08-27 MED ORDER — SIMETHICONE 80 MG PO CHEW: 80.0000 mg | CHEWABLE_TABLET | ORAL | Status: DC | PRN

## 2017-08-27 MED ORDER — FENTANYL CITRATE (PF) 100 MCG/2ML IJ SOLN
50.0000 ug | INTRAMUSCULAR | Status: DC | PRN
  Administered 2017-08-27: 100 ug via INTRAVENOUS
  Filled 2017-08-27: qty 2

## 2017-08-27 MED ORDER — OXYTOCIN 40 UNITS IN LACTATED RINGERS INFUSION - SIMPLE MED
1.0000 m[IU]/min | INTRAVENOUS | Status: DC
  Administered 2017-08-27: 1 m[IU]/min via INTRAVENOUS
  Filled 2017-08-27: qty 1000

## 2017-08-27 MED ORDER — GENTAMICIN SULFATE 40 MG/ML IJ SOLN
5.0000 mg/kg | INTRAVENOUS | Status: DC
  Administered 2017-08-28: 280 mg via INTRAVENOUS
  Filled 2017-08-27 (×2): qty 7

## 2017-08-27 MED ORDER — OXYCODONE HCL 5 MG PO TABS
5.0000 mg | ORAL_TABLET | ORAL | Status: DC | PRN
  Administered 2017-08-28: 5 mg via ORAL
  Filled 2017-08-27 (×2): qty 1

## 2017-08-27 MED ORDER — DIPHENHYDRAMINE HCL 25 MG PO CAPS: 25.0000 mg | ORAL_CAPSULE | Freq: Four times a day (QID) | ORAL | Status: DC | PRN

## 2017-08-27 MED ORDER — SENNOSIDES-DOCUSATE SODIUM 8.6-50 MG PO TABS
2.0000 | ORAL_TABLET | ORAL | Status: DC
  Administered 2017-08-28 – 2017-08-29 (×2): 2 via ORAL
  Filled 2017-08-27 (×3): qty 2

## 2017-08-27 MED ORDER — WITCH HAZEL-GLYCERIN EX PADS: 1.0000 "application " | MEDICATED_PAD | CUTANEOUS | Status: DC | PRN

## 2017-08-27 MED ORDER — LACTATED RINGERS IV SOLN: INTRAVENOUS | Status: DC

## 2017-08-27 MED ORDER — BUPIVACAINE HCL (PF) 0.25 % IJ SOLN
INTRAMUSCULAR | Status: DC | PRN
  Administered 2017-08-27: 10 mL via EPIDURAL

## 2017-08-27 MED ORDER — TERBUTALINE SULFATE 1 MG/ML IJ SOLN
0.2500 mg | Freq: Once | INTRAMUSCULAR | Status: AC | PRN
  Administered 2017-08-27: 0.25 mg via SUBCUTANEOUS
  Filled 2017-08-27: qty 1

## 2017-08-27 MED ORDER — LIDOCAINE HCL (PF) 2 % IJ SOLN
INTRAMUSCULAR | Status: DC | PRN
  Administered 2017-08-27: 5 mL via INTRADERMAL

## 2017-08-27 MED ORDER — EPHEDRINE 5 MG/ML INJ: 10.0000 mg | INTRAVENOUS | Status: DC | PRN

## 2017-08-27 MED ORDER — AMMONIA AROMATIC IN INHA
RESPIRATORY_TRACT | Status: AC
  Filled 2017-08-27: qty 10

## 2017-08-27 MED ORDER — PHENYLEPHRINE 40 MCG/ML (10ML) SYRINGE FOR IV PUSH (FOR BLOOD PRESSURE SUPPORT): 80.0000 ug | PREFILLED_SYRINGE | INTRAVENOUS | Status: DC | PRN

## 2017-08-27 MED ORDER — DIBUCAINE 1 % RE OINT: 1.0000 "application " | TOPICAL_OINTMENT | RECTAL | Status: DC | PRN

## 2017-08-27 NOTE — H&P (Signed)
Obstetrics Admission History & Physical   Rupture of Membranes   HPI:  22 y.o. G2P1001 @ 8890w6d (08/28/2017, by Last Menstrual Period). Admitted on 08/27/2017:   Patient Active Problem List   Diagnosis Date Noted  . PROM (premature rupture of membranes) 08/27/2017  . Back pain affecting pregnancy 08/21/2017  . Iron deficiency anemia 07/05/2017  . Anemia, antepartum, third trimester 06/16/2017  . Rh negative state in antepartum period 05/09/2017  . Marijuana use 03/18/2017  . Supervision of other normal pregnancy, antepartum 02/09/2017  . ADHD (attention deficit hyperactivity disorder) 11/20/2013  . Allergic rhinitis 11/20/2013     Presents for leaking fluid, first noticed a gush of fluid around 1031 this morning. When checked in triage by RN, there was a moderate amount of fluid present with moderate meconium visible. She is having mild contractions every 3-6 minutes, rating pain as 4-5/10. No vaginal bleeding. Endorses good fetal movement.  [facility] at: at [facility]. Pregnancy complicated by marijuana use, anemia.  ROS: A review of systems was performed and negative, except as stated in the above HPI.  PMHx:  Past Medical History:  Diagnosis Date  . Anemia   . Environmental and seasonal allergies    PSHx:  Past Surgical History:  Procedure Laterality Date  . WISDOM TOOTH EXTRACTION     age 22 - all four   Medications:  Medications Prior to Admission  Medication Sig Dispense Refill Last Dose  . vitamin B-12 (CYANOCOBALAMIN) 250 MCG tablet Take 250 mcg by mouth daily.   08/26/2017 at Unknown time  . ferrous sulfate (FERROUSUL) 325 (65 FE) MG tablet Take 1 tablet (325 mg total) by mouth daily with breakfast. (Patient not taking: Reported on 08/26/2017) 30 tablet 2 Not Taking  . Prenat-Fe Carbonyl-FA-Omega 3 (ONE-A-DAY WOMENS PRENATAL 1) 28-0.8-235 MG CAPS Take 1 capsule by mouth daily. (Patient not taking: Reported on 08/27/2017) 30 capsule 6 Not Taking at Unknown time    Allergies: is allergic to vicodin [hydrocodone-acetaminophen]. OBHx:  OB History  Gravida Para Term Preterm AB Living  2 1 1     1   SAB TAB Ectopic Multiple Live Births        0 1    # Outcome Date GA Lbr Len/2nd Weight Sex Delivery Anes PTL Lv  2 Current           1 Term 09/03/15 2756w2d / 00:24 3380 g M Vag-Spont Gen  LIV    Obstetric Comments  IOL for postdates with G1   ZOX:WRUEAFHx:Other than listed in HPI remarkable for: Hypertension in her maternal grandmother, breast cancer in her maternal great aunt and lung cancer in her maternal great-grandmother.  No family history of birth defects. Soc Hx: Former smoker and Recreational drug use: former  Objective:   Vitals:   08/27/17 1136 08/27/17 1139  BP:  101/62  Pulse:  (!) 114  Temp: 98.3 F (36.8 C)    Constitutional: Well nourished, well developed female in no acute distress.  HEENT: normal Skin: Warm and dry.  Cardiovascular: Tachycardia, S1 and S2 auscultated, no murmurs, rubs or gallops.   Extremity: trace to 1+ bilateral pedal edema Respiratory: Clear to auscultation bilaterally. Normal respiratory effort Abdomen: gravid, mild tenderness with contractions Neuro: Cranial nerves grossly intact Psych: Alert and Oriented x3. No memory deficits. Normal mood and affect.  MS: normal gait, normal bilateral lower extremity ROM/strength/stability.  Pelvic exam: 4/50/-1 (RN exam) Uterus: Spontaneous uterine activity   EFM: FHR: 130 bpm, variability: moderate,  Accelerations: Present,  decelerations: Present, occasional Toco: Frequency: Every 3-4 minutes, Duration: 50-90 seconds and Intensity: mild   Perinatal info:  Blood type: B negative Rubella - Not immune Varicella - Not immune TDaP - Given during third trimester of this pregnancy 06/28/2017 RPR NR / HIV Neg/ HBsAg Neg   Assessment & Plan:   22 y.o. G2P1001 @ 2672w6d, Admitted on 08/27/2017: for PROM and contractions.  Admit for labor, Observe for cervical change, Fetal  Wellbeing Reassuring, Epidural when ready and GBS status negative, treat as needed. Plan for Pitocin augmentation.  [name], CNM [facility], [facility] 08/27/2017  1:29 PM

## 2017-08-27 NOTE — Progress Notes (Signed)
Patient had a prolonged deceleration following epidural anesthesia. She was repositioned, Pitocin was stopped, a bolus was started, and oxygen was applied. Late decelerations continued with contractions, so a dose of terbutaline was given. Following this, the decelerations ceased and the FHR was in the 140s-160s with moderate variability. MD aware. Will continue to monitor.  She is more comfortable with her epidural, last cervical check by RN was 5.5/70/-1 immediately following anesthesia.  [name], CNM 08/27/2017  6:14 PM

## 2017-08-27 NOTE — Discharge Summary (Signed)
OB Discharge Summary     Patient Name: Jennifer [name] DOB: 1995/12/19 MRN: [id number]  Date of admission: 08/27/2017 Delivering MD: Rexene Agent, CNM  Date of Delivery: 08/27/2017  Date of discharge: 08/29/2017  Admitting diagnosis: Leaking fluid 39 wks preg Intrauterine pregnancy: [redacted]w[redacted]d    Secondary diagnosis: None     Discharge diagnosis: Term Pregnancy Delivered, Chorioamnionitis                         Hospital course:  Onset of Labor With Vaginal Delivery     22y.o. yo [id number] 374w6das admitted in Latent Labor on 08/27/2017. Patient had an uncomplicated labor course as follows:  Membrane Rupture Time/Date: 10:30 AM ,08/27/2017   Intrapartum Procedures: Episiotomy: None [1]                                         Lacerations:  None [1]  Patient had a delivery of a Viable infant. 08/27/2017  Information for the patient's newborn:  WeKameko, Hukill0[[id number]]Delivery Method: Vag-Spont    Patient had an uncomplicated postpartum course.  She is ambulating, tolerating a regular diet, passing flatus, and urinating well. Patient is discharged home in stable condition on 08/29/17.                                                                  Post partum procedures: Rhogam, MMR, Varivax  Complications: None  Physical exam on 08/29/2017: Vitals:   08/28/17 1157 08/28/17 1617 08/28/17 2330 08/29/17 0753  BP: '96/61 98/62 96/64 '$ 99/66  Pulse: 77 90 88 65  Resp: '20 18 20 18  '$ Temp: 98.1 F (36.7 C) 98.5 F (36.9 C) 97.8 F (36.6 C) 98.3 F (36.8 C)  TempSrc: Oral   Oral  SpO2: 98% 99% 100% 98%  Weight:      Height:       General: alert, cooperative and no distress Lochia: appropriate Uterine Fundus: firm Incision: N/A DVT Evaluation: No evidence of DVT seen on physical exam.  Labs: Lab Results  Component Value Date   WBC 23.3 (H) 08/28/2017   HGB 10.4 (L) 08/28/2017   HCT 30.4 (L) 08/28/2017   MCV 87.0 08/28/2017   PLT 158 08/28/2017   CMP Latest Ref  Rng & Units 05/23/2016  Glucose 65 - 99 mg/dL 107(H)  BUN 6 - 20 mg/dL 12  Creatinine 0.44 - 1.00 mg/dL 0.70  Sodium 135 - 145 mmol/L 137  Potassium 3.5 - 5.1 mmol/L 3.9  Chloride 101 - 111 mmol/L 100(L)  CO2 22 - 32 mmol/L 27  Calcium 8.9 - 10.3 mg/dL 9.3  Total Protein 6.5 - 8.1 g/dL 8.1  Total Bilirubin 0.3 - 1.2 mg/dL 0.7  Alkaline Phos 38 - 126 U/L 77  AST 15 - 41 U/L 28  ALT 14 - 54 U/L 21    Discharge instruction: per After Visit Summary.  Medications:  Allergies as of 08/29/2017      Reactions   Vicodin [hydrocodone-acetaminophen] Itching      Medication List    STOP taking these medications   ferrous sulfate 325 (65 FE)  MG tablet     TAKE these medications   ONE-A-DAY WOMENS PRENATAL 1 28-0.8-235 MG Caps Take 1 capsule by mouth daily.   vitamin B-12 250 MCG tablet Commonly known as:  CYANOCOBALAMIN Take 250 mcg by mouth daily.       Diet: routine diet  Activity: Advance as tolerated. Pelvic rest for 6 weeks.   Outpatient follow up: Follow-up Information    Rexene Agent, CNM. Schedule an appointment as soon as possible for a visit in 6 week(s).   Specialty:  Certified Nurse Midwife Why:  Please call to schedule your 6 week postpartum follow up appointment with Delman Kitten information: [address] [place] [address] [phone]             Postpartum contraception: considering Nexplanon Rhogam Given postpartum: yes Rubella vaccine given postpartum: yes Varicella vaccine given postpartum: yes TDaP given antepartum or postpartum: Yes given antepartum  Newborn Data: Live born female  Birth Weight: 3950 g APGAR: 7, 8  Newborn Delivery   Birth date/time:  08/27/2017 21:58:00 Delivery type:  Vaginal, Spontaneous      Baby Feeding: Formula  Disposition:home with mother  SIGNED:  Rod Can, CNM 08/29/2017 10:07 AM

## 2017-08-27 NOTE — OB Triage Note (Signed)
Gush of fluid @ 1031 today. Jennifer [name], Jennifer [name] S

## 2017-08-27 NOTE — Anesthesia Preprocedure Evaluation (Signed)
Anesthesia Evaluation  Patient identified by MRN, date of birth, ID band Patient awake    Reviewed: Allergy & Precautions, NPO status , Patient's Chart, lab work & pertinent test results  Airway Mallampati: II       Dental   Pulmonary former smoker,    Pulmonary exam normal        Cardiovascular negative cardio ROS Normal cardiovascular exam     Neuro/Psych PSYCHIATRIC DISORDERS    GI/Hepatic negative GI ROS, Neg liver ROS,   Endo/Other  negative endocrine ROS  Renal/GU negative Renal ROS  negative genitourinary   Musculoskeletal negative musculoskeletal ROS (+)   Abdominal Normal abdominal exam  (+)   Peds negative pediatric ROS (+)  Hematology  (+) anemia ,   Anesthesia Other Findings   Reproductive/Obstetrics                             Anesthesia Physical Anesthesia Plan  ASA: II  Anesthesia Plan: Epidural   Post-op Pain Management:    Induction:   PONV Risk Score and Plan:   Airway Management Planned: Nasal Cannula  Additional Equipment:   Intra-op Plan:   Post-operative Plan:   Informed Consent: I have reviewed the patients History and Physical, chart, labs and discussed the procedure including the risks, benefits and alternatives for the proposed anesthesia with the patient or authorized representative who has indicated his/her understanding and acceptance.   Dental advisory given  Plan Discussed with: CRNA and Surgeon  Anesthesia Plan Comments:         Anesthesia Quick Evaluation

## 2017-08-27 NOTE — Anesthesia Procedure Notes (Signed)
Epidural Patient location during procedure: OB Start time: 08/27/2017 5:23 PM End time: 08/27/2017 5:33 PM  Staffing Anesthesiologist: Yves Dillarroll, Shariff Lasky, MD Performed: anesthesiologist   Preanesthetic Checklist Completed: patient identified, site marked, surgical consent, pre-op evaluation, timeout performed, IV checked, risks and benefits discussed and monitors and equipment checked  Epidural Patient position: sitting Prep: Betadine Patient monitoring: heart rate, continuous pulse ox and blood pressure Approach: midline Location: L3-L4 Injection technique: LOR air  Needle:  Needle type: Tuohy  Needle gauge: 17 G Needle length: 9 cm and 9 Catheter type: closed end flexible Catheter size: 19 Gauge Test dose: negative and 1.5% lidocaine with Epi 1:200 K  Assessment Events: blood not aspirated, injection not painful, no injection resistance, negative IV test and no paresthesia  Additional Notes Time out called.  Patient placed in sitting position.  Back prepped and draped in sterile fashion.  A skin wheal was made in the L3-L4 interspace with 1% Lidocaine plain.  A 17G Tuohy needle was advanced to the epidural space by a loss of resistance technique.  The epidural catheter was threaded 3 cm and the TD was negative.  No blood, fluid or paresthesias.  The patient tolerated the procedure well.  The catheter was affixed to the back in sterile fashion.Reason for block:procedure for pain

## 2017-08-28 ENCOUNTER — Inpatient Hospital Stay
Admission: AD | Admit: 2017-08-28 | Payer: Medicaid Other | Source: Ambulatory Visit | Admitting: Obstetrics and Gynecology

## 2017-08-28 LAB — CBC
HEMATOCRIT: 30.4 % — AB (ref 35.0–47.0)
HEMOGLOBIN: 10.4 g/dL — AB (ref 12.0–16.0)
MCH: 29.7 pg (ref 26.0–34.0)
MCHC: 34.1 g/dL (ref 32.0–36.0)
MCV: 87 fL (ref 80.0–100.0)
Platelets: 158 10*3/uL (ref 150–440)
RBC: 3.49 MIL/uL — ABNORMAL LOW (ref 3.80–5.20)
RDW: 17.1 % — ABNORMAL HIGH (ref 11.5–14.5)
WBC: 23.3 10*3/uL — ABNORMAL HIGH (ref 3.6–11.0)

## 2017-08-28 MED ORDER — SODIUM CHLORIDE 0.9 % IV SOLN
INTRAVENOUS | Status: DC | PRN
  Administered 2017-08-28: 07:00:00 via INTRAVENOUS

## 2017-08-28 NOTE — Anesthesia Postprocedure Evaluation (Signed)
Anesthesia Post Note  Patient: Jennifer [name]  Procedure(s) Performed: AN AD HOC LABOR EPIDURAL  Patient location during evaluation: Mother Baby Anesthesia Type: Epidural Level of consciousness: awake and alert and oriented Pain management: pain level controlled Vital Signs Assessment: post-procedure vital signs reviewed and stable Respiratory status: spontaneous breathing Cardiovascular status: blood pressure returned to baseline Anesthetic complications: no     Last Vitals:  Vitals:   08/28/17 0801 08/28/17 1157  BP: 97/69 96/61  Pulse: 89 77  Resp: 18 20  Temp: 36.8 C 36.7 C  SpO2: 99% 98%    Last Pain:  Vitals:   08/28/17 1222  TempSrc:   PainSc: 0-No pain                 [name]

## 2017-08-28 NOTE — Plan of Care (Signed)
  Problem: Education: Goal: Knowledge of General Education information will improve Description Including pain rating scale, medication(s)/side effects and non-pharmacologic comfort measures 08/28/2017 0121 by Justus Memoryouglas, Zandria Woldt R, RN Outcome: Progressing 08/28/2017 0121 by Justus Memoryouglas, Lizzeth Meder R, RN Outcome: Progressing   Problem: Activity: Goal: Will verbalize the importance of balancing activity with adequate rest periods 08/28/2017 0121 by Justus Memoryouglas, Quyen Cutsforth R, RN Outcome: Progressing 08/28/2017 0121 by Justus Memoryouglas, Jarvis Knodel R, RN Outcome: Progressing Goal: Ability to tolerate increased activity will improve 08/28/2017 0121 by Justus Memoryouglas, Lyell Clugston R, RN Outcome: Progressing 08/28/2017 0121 by Justus Memoryouglas, Ted Leonhart R, RN Outcome: Progressing

## 2017-08-29 ENCOUNTER — Encounter: Payer: Self-pay | Admitting: Emergency Medicine

## 2017-08-29 ENCOUNTER — Other Ambulatory Visit: Payer: Self-pay

## 2017-08-29 ENCOUNTER — Emergency Department
Admission: EM | Admit: 2017-08-29 | Discharge: 2017-08-29 | Disposition: A | Discharge status: Home / Self Care | Payer: Medicaid Other | Attending: Emergency Medicine | Admitting: Emergency Medicine

## 2017-08-29 DIAGNOSIS — R300 Dysuria: Secondary | ICD-10-CM | POA: Insufficient documentation

## 2017-08-29 DIAGNOSIS — Z5321 Procedure and treatment not carried out due to patient leaving prior to being seen by health care provider: Secondary | ICD-10-CM | POA: Insufficient documentation

## 2017-08-29 LAB — URINALYSIS, COMPLETE (UACMP) WITH MICROSCOPIC
BILIRUBIN URINE: NEGATIVE
Bacteria, UA: NONE SEEN
GLUCOSE, UA: NEGATIVE mg/dL
KETONES UR: 5 mg/dL — AB
NITRITE: NEGATIVE
PH: 5 (ref 5.0–8.0)
Protein, ur: 100 mg/dL — AB
RBC / HPF: >50 RBC/hpf — ABNORMAL HIGH (ref 0–5)
SPECIFIC GRAVITY, URINE: 1.033 — AB (ref 1.005–1.030)

## 2017-08-29 LAB — BPAM RBC
BLOOD PRODUCT EXPIRATION DATE: 201909122359
Blood Product Expiration Date: 201909192359
Unit Type and Rh: 9500
Unit Type and Rh: 9500

## 2017-08-29 LAB — TYPE AND SCREEN
ABO/RH(D): B NEG
Antibody Screen: POSITIVE
UNIT DIVISION: 0
Unit division: 0

## 2017-08-29 LAB — RPR: RPR Ser Ql: NONREACTIVE

## 2017-08-29 LAB — FETAL SCREEN: Fetal Screen: NEGATIVE

## 2017-08-29 MED ORDER — RHO D IMMUNE GLOBULIN 1500 UNIT/2ML IJ SOSY
300.0000 ug | PREFILLED_SYRINGE | Freq: Once | INTRAMUSCULAR | Status: AC
  Administered 2017-08-29: 300 ug via INTRAMUSCULAR
  Filled 2017-08-29: qty 2

## 2017-08-29 NOTE — Progress Notes (Signed)
PPD#1 SVD Subjective:  Fatigued, holding baby.  Pain control is adequate. Voiding without difficulty. Tolerating a regular diet. Ambulating well.  Objective:   Blood pressure 96/64, pulse 88, temperature 97.8 F (36.6 C), resp. rate 20, height 5\' 2"  (1.575 m), weight 64.4 kg, last menstrual period 11/21/2016, SpO2 100 %.  General: NAD Pulmonary: no increased work of breathing Abdomen: non-distended, non-tender Uterus:  fundus firm; lochia appropriate Laceration: N/A Extremities: no edema, no erythema, no tenderness, no signs of DVT  Results for orders placed or performed during the hospital encounter of 08/27/17 (from the past 72 hour(s))  CBC     Status: Abnormal   Collection Time: 08/27/17  1:09 PM  Result Value Ref Range   WBC 20.5 (H) 3.6 - 11.0 K/uL   RBC 3.91 3.80 - 5.20 MIL/uL   Hemoglobin 11.3 (L) 12.0 - 16.0 g/dL   HCT 16.134.2 (L) 09.635.0 - 04.547.0 %   MCV 87.5 80.0 - 100.0 fL   MCH 29.0 26.0 - 34.0 pg   MCHC 33.2 32.0 - 36.0 g/dL   RDW 40.917.6 (H) 81.111.5 - 91.414.5 %   Platelets 174 150 - 440 K/uL    Comment: Performed at [facility], [address] [place], [place] [address]  Type and screen [facility]     Status: None   Collection Time: 08/27/17  1:09 PM  Result Value Ref Range   ABO/RH(D) B NEG    Antibody Screen POS    Sample Expiration 08/30/2017    Antibody Identification PASSIVELY ACQUIRED ANTI-D    Unit Number [id number]    Blood Component Type RED CELLS,LR    Unit division 00    Status of Unit REL FROM [facility]    Transfusion Status OK TO TRANSFUSE    Crossmatch Result      COMPATIBLE Performed at [facility], [address] [place], [place] [address]    Unit Number [id number]    Blood Component Type RED CELLS,LR    Unit division 00    Status of Unit REL FROM [facility]    Transfusion Status OK TO TRANSFUSE    Crossmatch Result COMPATIBLE   RPR     Status: None   Collection Time: 08/27/17  1:09 PM  Result Value Ref  Range   RPR Ser Ql Non Reactive Non Reactive    Comment: (NOTE) Performed At: [facility] [address] [place], [place] [id number] Jolene SchimkeNagendra Sanjai MD VQ:[phone]   Urine Drug Screen, Qualitative ([facility] only)     Status: Abnormal   Collection Time: 08/27/17  1:09 PM  Result Value Ref Range   Tricyclic, Ur Screen NONE DETECTED NONE DETECTED   Amphetamines, Ur Screen NONE DETECTED NONE DETECTED   MDMA (Ecstasy)Ur Screen NONE DETECTED NONE DETECTED   Cocaine Metabolite,Ur [place] NONE DETECTED NONE DETECTED   Opiate, Ur Screen NONE DETECTED NONE DETECTED   Phencyclidine (PCP) Ur S NONE DETECTED NONE DETECTED   Cannabinoid 50 Ng, Ur [place] POSITIVE (A) NONE DETECTED   Barbiturates, Ur Screen NONE DETECTED NONE DETECTED   Benzodiazepine, Ur Scrn TEST NOT PERFORMED, REAGENT NOT AVAILABLE (A) NONE DETECTED   Methadone Scn, Ur NONE DETECTED NONE DETECTED    Comment: (NOTE) Tricyclics + metabolites, urine    Cutoff 1000 ng/mL Amphetamines + metabolites, urine  Cutoff 1000 ng/mL MDMA (Ecstasy), urine              Cutoff 500 ng/mL Cocaine Metabolite, urine  Cutoff 300 ng/mL Opiate + metabolites, urine        Cutoff 300 ng/mL Phencyclidine (PCP), urine         Cutoff 25 ng/mL Cannabinoid, urine                 Cutoff 50 ng/mL Barbiturates + metabolites, urine  Cutoff 200 ng/mL Benzodiazepine, urine              Cutoff 200 ng/mL Methadone, urine                   Cutoff 300 ng/mL The urine drug screen provides only a preliminary, unconfirmed analytical test result and should not be used for non-medical purposes. Clinical consideration and professional judgment should be applied to any positive drug screen result due to possible interfering substances. A more specific alternate chemical method must be used in order to obtain a confirmed analytical result. Gas chromatography / mass spectrometry (GC/MS) is the preferred confirmat ory method. Performed at [facility], [address] [place], [place] [address]   CBC     Status: Abnormal   Collection Time: 08/28/17  5:42 AM  Result Value Ref Range   WBC 23.3 (H) 3.6 - 11.0 K/uL   RBC 3.49 (L) 3.80 - 5.20 MIL/uL   Hemoglobin 10.4 (L) 12.0 - 16.0 g/dL   HCT 19.130.4 (L) 47.835.0 - 29.547.0 %   MCV 87.0 80.0 - 100.0 fL   MCH 29.7 26.0 - 34.0 pg   MCHC 34.1 32.0 - 36.0 g/dL   RDW 62.117.1 (H) 30.811.5 - 65.714.5 %   Platelets 158 150 - 440 K/uL    Comment: Performed at [facility], [address] [place], [place] [address]    Assessment:   22 y.o. [id number] postpartum day #1 recovering well.  Plan:   1) Acute blood loss anemia - hemodynamically stable and asymptomatic.  2) Blood Type --/--/B NEG (08/17 1309)  Information for the patient's newborn:  Jennifer [name], Girl Jennifer [name] [[id number]][[id number]]  B POS RhoGAM ordered  3) Rubella 0.95 (01/30 1131) / Varicella Non-immune /TDAP status: not given this pregnancy. Vaccines ordered postpartum.   4) Formula feeding.  5) Contraception: undecided  6) Disposition: continue postpartum care. Anticipate discharge tomorrow.  Marcelyn BruinsJacelyn Laylaa Guevarra, CNM

## 2017-08-29 NOTE — ED Triage Notes (Addendum)
Pt to triage via w/c with no distress noted; vag delivery 8/17 with no complications; st something bulging from vagina noted tonight and dysuria; denies any new abd pain or vag bleeding; pt reports nothing bulging at present

## 2017-08-29 NOTE — Clinical Social Work Maternal (Signed)
  CLINICAL SOCIAL WORK MATERNAL/CHILD NOTE  Patient Details  Name: Jennifer [name] MRN: [id number] Date of Birth: [birth year]  Date:  08/29/2017  Clinical Social Worker Initiating Note:  Shela Leff MSW,LCSW DP Date/Time: Initiated:  08/29/17/      Child's Name:      Biological Parents:  Mother, Father   Need for Interpreter:  None   Reason for Referral:  Current Substance Use/Substance Use During Pregnancy    Address:  [place] [address]    Phone number:  [phone] (home)     Additional phone number: none  Household Members/Support Persons (HM/SP):       HM/SP Name Relationship DOB or Age  HM/SP -1        HM/SP -2        HM/SP -3        HM/SP -4        HM/SP -5        HM/SP -6        HM/SP -7        HM/SP -8          Natural Supports (not living in the home):  Immediate Family, Spouse/significant other   Professional Supports:     Employment: Unemployed   Type of Work:     Education:      Homebound arranged:    Pensions consultant:  Medicaid   Other Resources:  [employer], Physicist, medical    Cultural/Religious Considerations Which May Impact Care:  none  Strengths:  Ability to meet basic needs , Home prepared for child    Psychotropic Medications:         Pediatrician:       Pediatrician List:   [place]      Pediatrician Fax Number:    Risk Factors/Current Problems:  Substance Use    Cognitive State:  Able to Concentrate , Alert    Mood/Affect:  Calm    CSW Assessment: CSW met with patient this morning at bedside. Father of baby was sleeping in chair. Patient's mother was pleasant and cooperative throughout assessment. She stated that this was her second child and that her mother was watching the first child. Patient stated that father of baby will be staying with her temporarily to help her out. She stated that she has all  necessities for her newborn and has no concerns with transportation. Patient denies any history of mental illness and states she is aware of postpartum depression and signs and symptoms to look out for. She states that she used marijuana on and off throughout the pregnancy. She stated she used for stimulating appetite. CSW explained that a DSS CPS report would need to be made and CSW explained what that entailed. She seemed to be aware of what would occur and when CSW questioned if she had ever been involved with DSS, she stated that she had but not for marijuana and that her friend who uses marijuana had a DSS report made on her.   CSW made report to DSS CPS today.  CSW Plan/Description:  Child Protective Service Report     Shela Leff, LCSW 08/29/2017, 12:22 PM

## 2017-08-29 NOTE — Discharge Instructions (Signed)
Please call your doctor or return to the ER if you experience any chest pains, shortness of breath, dizziness, visual changes, fever greater than 101, any heavy bleeding (saturating more than 1 pad per hour), large clots, or foul smelling discharge, any worsening abdominal pain and cramping that is not controlled by pain medication, or any signs of postpartum depression. No tampons, enemas, douches, or sexual intercourse for 6 weeks. Also hot tubs, or swimming for 6 weeks.

## 2017-08-29 NOTE — Progress Notes (Signed)
Discharge order received from doctor. Rhogam, MMR vaccine, and varicella vaccine given prior to discharge. Reviewed discharge instructions with patient and answered all questions. Follow up appointment instructions given. Patient verbalized understanding. ID bands checked. Patient discharged home with infant via wheelchair by nursing/auxillary.    Abigail Garner, RN  

## 2017-08-30 ENCOUNTER — Telehealth: Payer: Self-pay

## 2017-08-30 ENCOUNTER — Encounter: Payer: Self-pay | Admitting: Obstetrics and Gynecology

## 2017-08-30 ENCOUNTER — Ambulatory Visit (INDEPENDENT_AMBULATORY_CARE_PROVIDER_SITE_OTHER): Payer: Medicaid Other | Admitting: Obstetrics and Gynecology

## 2017-08-30 VITALS — BP 110/72 | HR 81 | Ht 62.0 in | Wt 132.0 lb

## 2017-08-30 DIAGNOSIS — N812 Incomplete uterovaginal prolapse: Secondary | ICD-10-CM | POA: Diagnosis not present

## 2017-08-30 DIAGNOSIS — N814 Uterovaginal prolapse, unspecified: Secondary | ICD-10-CM | POA: Diagnosis not present

## 2017-08-30 LAB — RHOGAM INJECTION: Unit division: 0

## 2017-08-30 NOTE — Progress Notes (Signed)
Patient ID: Jennifer [name], female   DOB: 06-07-1995, 22 y.o.   MRN: [id number]  Reason for Consult: Follow-up (Poss prolapse/ went to bathroom and felt a ball hanging out of vagina )   Referred by Jennifer Farberhies, [name], MD  Subjective:     HPI:  Jennifer [name] is a 22 y.o. female . She presents today for a postpartum visit. She reports that yesterday she could feel her cervix hanging out of her vaginal about 3cm. She says that she went to the ER, but left without being seen because she waited for a long time and the uterus had returned to inside. She has been having small to minimal bleeding. She is not in severe pain and is able to ambulate and mvoe about the office.   Past Medical History:  Diagnosis Date  . Anemia   . Environmental and seasonal allergies    Family History  Problem Relation Age of Onset  . Hypertension Maternal Grandmother   . Breast cancer Other   . Cancer Other 74       lung   Past Surgical History:  Procedure Laterality Date  . WISDOM TOOTH EXTRACTION     age 22 - all four    [name] Social History:  Social History   Tobacco Use  . Smoking status: Former Smoker    Last attempt to quit: 04/28/2015    Years since quitting: 2.3  . Smokeless tobacco: Never Used  . Tobacco comment: quit with positive pregnancy test  Substance Use Topics  . Alcohol use: Not Currently    Alcohol/week: 6.0 standard drinks    Types: 6 Shots of liquor per week    Frequency: Never    Comment: weekends    Allergies  Allergen Reactions  . Vicodin [Hydrocodone-Acetaminophen] Itching    Current Outpatient Medications  Medication Sig Dispense Refill  . vitamin B-12 (CYANOCOBALAMIN) 250 MCG tablet Take 250 mcg by mouth daily.    . Prenat-Fe Carbonyl-FA-Omega 3 (ONE-A-DAY WOMENS PRENATAL 1) 28-0.8-235 MG CAPS Take 1 capsule by mouth daily. (Patient not taking: Reported on 08/27/2017) 30 capsule 6   No current facility-administered medications for this visit.     Review of Systems   Constitutional: Negative for chills, fatigue, fever and unexpected weight change.  HENT: Negative for trouble swallowing.  Eyes: Negative for loss of vision.  Respiratory: Negative for cough, shortness of breath and wheezing.  Cardiovascular: Negative for chest pain, leg swelling, palpitations and syncope.  GI: Negative for abdominal pain, blood in stool, diarrhea, nausea and vomiting.  GU: Negative for difficulty urinating, dysuria, frequency and hematuria.  Musculoskeletal: Negative for back pain, leg pain and joint pain.  Skin: Negative for rash.  Neurological: Negative for dizziness, headaches, light-headedness, numbness and seizures.  Psychiatric: Negative for behavioral problem, confusion, depressed mood and sleep disturbance.        Objective:  Objective   Vitals:   08/30/17 1005  BP: 110/72  Pulse: 81  Weight: 132 lb (59.9 kg)  Height: 5\' 2"  (1.575 m)   Body mass index is 24.14 kg/m.  Physical Exam  Constitutional: She is oriented to person, place, and time. She appears well-developed and well-nourished.  HENT:  Head: Normocephalic and atraumatic.  Eyes: Pupils are equal, round, and reactive to light. EOM are normal.  Cardiovascular: Normal rate and regular rhythm.  Pulmonary/Chest: Effort normal. No respiratory distress.  Abdominal: Soft. She exhibits no distension. There is no tenderness.  Genitourinary:  Genitourinary Comments: Visual inspection of vaginal  introitus is normal. Asked patient to Valsalva and push. With effort the anterior vaginal all comes to the level of the cervix. There is a small amount of posterior wall prolapse. The cervix come to about 1cm posterior to the hymen. No vaginal or perineal lacerations.   Neurological: She is alert and oriented to person, place, and time.  Skin: Skin is warm and dry.  Psychiatric: She has a normal mood and affect. Her behavior is normal. Judgment and thought content normal.  Nursing note and vitals reviewed.        Assessment/Plan:      22 yo with anterior, superior, posterior prolapse postpartum, stage 2. Discussed with [name] that this will improve as she moves away from the recent postpartum period. Discussed kegel exercises and how to perform theses exercises. She did not have any vaginal lacerations. Will refer to pelvic floor physical therapy for help in postpartum recovery and strengthening the pelvic floor muscles.  Encouraged smoking cessation.    [name] MD [facility], [facility] 08/30/17 7:34 PM

## 2017-08-30 NOTE — Telephone Encounter (Signed)
LMVM to notify of Dr. Tomasa BlaseShuman's response. Pt advised to call back or report to ER with any further questions/concerns.

## 2017-08-30 NOTE — Telephone Encounter (Signed)
Pt states she just left from seeing Dr. Jerene PitchSchuman. Pt requesting something for pain. She was cleaning up because [employer] was coming. She started hurting really bad & got stuck. She is laying in the bed "crumped up, can barely lift up & side is really hurting". She has taken 4-5 ibuprofen which are not helping. XB#[phone]#[phone]

## 2017-08-30 NOTE — Telephone Encounter (Signed)
I would ask her to take a warm bath. I am not sending a pian medication for her at this time.

## 2017-09-02 ENCOUNTER — Encounter: Payer: Medicaid Other | Admitting: Obstetrics and Gynecology

## 2017-10-26 ENCOUNTER — Telehealth: Payer: Self-pay | Admitting: Obstetrics and Gynecology

## 2017-10-26 NOTE — Telephone Encounter (Signed)
Patient never came to 6 week PP visit. Patient is schedule 10/31/17 at 2:30 with Dr. Jerene Pitch for 6 week PP and nexplanon insertion

## 2017-10-27 DIAGNOSIS — F4323 Adjustment disorder with mixed anxiety and depressed mood: Secondary | ICD-10-CM

## 2017-10-27 DIAGNOSIS — F902 Attention-deficit hyperactivity disorder, combined type: Secondary | ICD-10-CM

## 2017-10-27 HISTORY — DX: Attention-deficit hyperactivity disorder, combined type: F90.2

## 2017-10-27 HISTORY — DX: Adjustment disorder with mixed anxiety and depressed mood: F43.23

## 2017-10-27 NOTE — Telephone Encounter (Signed)
Nexplanon reserved for this patient. 

## 2017-10-31 ENCOUNTER — Ambulatory Visit: Payer: Medicaid Other | Admitting: Obstetrics and Gynecology

## 2017-11-01 ENCOUNTER — Inpatient Hospital Stay: Payer: Medicaid Other | Admitting: Oncology

## 2017-11-01 ENCOUNTER — Inpatient Hospital Stay: Payer: Medicaid Other

## 2017-11-10 ENCOUNTER — Inpatient Hospital Stay: Payer: Medicaid Other | Admitting: Oncology

## 2017-11-10 ENCOUNTER — Inpatient Hospital Stay: Payer: Medicaid Other

## 2017-11-10 ENCOUNTER — Telehealth: Payer: Self-pay | Admitting: Oncology

## 2017-11-10 NOTE — Telephone Encounter (Signed)
Patient No Showed for 11/10/17 appt. Left msg on VM.

## 2017-12-29 ENCOUNTER — Ambulatory Visit (INDEPENDENT_AMBULATORY_CARE_PROVIDER_SITE_OTHER): Payer: Medicaid Other | Admitting: Obstetrics and Gynecology

## 2017-12-29 ENCOUNTER — Encounter: Payer: Self-pay | Admitting: Obstetrics and Gynecology

## 2017-12-29 ENCOUNTER — Ambulatory Visit: Payer: Medicaid Other | Admitting: Obstetrics and Gynecology

## 2017-12-29 VITALS — BP 114/70 | Wt 122.0 lb

## 2017-12-29 DIAGNOSIS — R102 Pelvic and perineal pain: Secondary | ICD-10-CM | POA: Diagnosis not present

## 2017-12-29 NOTE — Progress Notes (Signed)
Obstetrics & Gynecology Office Visit   Chief Complaint  Patient presents with  . Pelvic Pain   History of Present Illness: 22 y.o. [id number] who presents for pelvic pain.  She notes bilateral stabbing pain in her bilateral lower quadrants.  The pain started 2-3 days ago.  She states that the pain feels like the pain she had after she had her baby.  On a scale of 1-10 the pain is a 7 at worst.  Alleviating factors: nothing. Hasn't tried anything.  She has tried Tylenol.  Aggravating factors: certain movements.  It is not affected by eating, bowel movements, or voiding.  Associated symptoms: lower back pain.  She has had fevers and chills (hasn't taken her temperature, but has felt hot).  She denies dysuria and hematuria and frequency.  She denies blood in her stool.  She denies vaginal symptoms of itching, burning, and irritation.  She denies abnormal vaginal discharge. She is doing nothing for contraception. She has been sexually active since her delivery.  She had a negative gonorrhea and chlamydia test in July of this year.    Past Medical History:  Diagnosis Date  . Anemia   . Environmental and seasonal allergies     Past Surgical History:  Procedure Laterality Date  . WISDOM TOOTH EXTRACTION     age 22 - all four    Gynecologic History: Patient's last menstrual period was 12/11/2017.  Obstetric History: G2P2002, s/p SVD x 2  Family History  Problem Relation Age of Onset  . Hypertension Maternal Grandmother   . Breast cancer Other   . Cancer Other 74       lung    Social History   Socioeconomic History  . Marital status: Single    Spouse name: Not on file  . Number of children: 1  . Years of education: 4912  . Highest education level: Not on file  Occupational History  . Occupation: UNEMPLOYED  Social Needs  . Financial resource strain: Not on file  . Food insecurity:    Worry: Not on file    Inability: Not on file  . Transportation needs:    Medical: Not on file   Non-medical: Not on file  Tobacco Use  . Smoking status: Current Every Day Smoker    Packs/day: 1.00    Types: Cigarettes    Last attempt to quit: 04/28/2015    Years since quitting: 2.6  . Smokeless tobacco: Never Used  . Tobacco comment: quit with positive pregnancy test  Substance and Sexual Activity  . Alcohol use: Yes    Alcohol/week: 6.0 standard drinks    Types: 6 Shots of liquor per week    Frequency: Never    Comment: weekends  . Drug use: Yes    Types: Marijuana    Comment: last use yesterday  . Sexual activity: Yes    Birth control/protection: None  Lifestyle  . Physical activity:    Days per week: Not on file    Minutes per session: Not on file  . Stress: Not on file  Relationships  . Social connections:    Talks on phone: Not on file    Gets together: Not on file    Attends religious service: Not on file    Active member of club or organization: Not on file    Attends meetings of clubs or organizations: Not on file    Relationship status: Not on file  . Intimate partner violence:    Fear of current or  ex partner: No    Emotionally abused: No    Physically abused: No    Forced sexual activity: No  Other Topics Concern  . Not on file  Social History Narrative  . Not on file    Allergies  Allergen Reactions  . Vicodin [Hydrocodone-Acetaminophen] Itching    Prior to Admission medications   Medication Sig Start Date End Date Taking? Authorizing Provider  Prenat-Fe Carbonyl-FA-Omega 3 (ONE-A-DAY WOMENS PRENATAL 1) 28-0.8-235 MG CAPS Take 1 capsule by mouth daily. Patient not taking: Reported on 08/27/2017 02/18/17   Oswaldo ConroySchmid, Jacelyn Y, CNM  vitamin B-12 (CYANOCOBALAMIN) 250 MCG tablet Take 250 mcg by mouth daily.    [provider]    Review of Systems  Constitutional: Negative.   HENT: Negative.   Eyes: Negative.   Respiratory: Negative.   Cardiovascular: Negative.   Gastrointestinal: Negative.   Genitourinary: Negative.   Musculoskeletal:  Negative.   Skin: Negative.   Neurological: Negative.   Psychiatric/Behavioral: Negative.      Physical Exam BP 114/70   Wt 122 lb (55.3 kg)   LMP 12/11/2017   BMI 22.31 kg/m  Patient's last menstrual period was 12/11/2017. Physical Exam Constitutional:      General: She is not in acute distress.    Appearance: Normal appearance.  Genitourinary:     Pelvic exam was performed with patient supine.     Vulva, inguinal canal, urethra, bladder, vagina and uterus normal.     No vulval lesion, tenderness, ulcerations or rash noted.     No signs of labial injury.     No posterior fourchette tenderness, injury or lesion present.     No vaginal discharge, erythema, tenderness, bleeding or ulceration.     Cervix is parous.     Cervix is not nulliparous.     No cervical motion tenderness, discharge, friability, lesion, erythema or bleeding.     Uterus is mobile.     Uterus is not enlarged, tender or irregular.     No uterine mass detected.    Uterus is midaxial.     No right or left adnexal mass present.     Right adnexa tender.     Right adnexa not full.     Left adnexa tender and full.  HENT:     Head: Normocephalic and atraumatic.  Eyes:     General: No scleral icterus.    Conjunctiva/sclera: Conjunctivae normal.  Neck:     Musculoskeletal: Normal range of motion and neck supple. No neck rigidity.  Cardiovascular:     Rate and Rhythm: Normal rate and regular rhythm.     Heart sounds: No murmur. No friction rub. No gallop.   Pulmonary:     Effort: Pulmonary effort is normal.     Breath sounds: Normal breath sounds. No wheezing, rhonchi or rales.  Abdominal:     General: There is no distension.     Palpations: Abdomen is soft. There is no mass.     Tenderness: There is abdominal tenderness (RLQ and LLQ). There is no right CVA tenderness, left CVA tenderness, guarding or rebound.     Hernia: No hernia is present. There is no hernia in the right inguinal area or left inguinal  area.  Musculoskeletal: Normal range of motion.        General: No swelling.  Lymphadenopathy:     Cervical: No cervical adenopathy.  Neurological:     General: No focal deficit present.     Mental Status: She is  alert and oriented to person, place, and time.     Cranial Nerves: No cranial nerve deficit.  Skin:    General: Skin is warm and dry.     Findings: No erythema.  Psychiatric:        Mood and Affect: Mood normal.        Behavior: Behavior normal.        Thought Content: Thought content normal.        Judgment: Judgment normal.   Female chaperone present for pelvic and breast  portions of the physical exam  Assessment: 22 y.o. [id number] female here for  1. Acute pelvic pain, female      Plan: Problem List Items Addressed This Visit    None    Visit Diagnoses    Acute pelvic pain, female    -  Primary   Relevant Orders   Chlamydia/Gonococcus/Trichomonas, NAA   Urine Culture   US PELVIS TRANSVANGINAL NON-OB (TV ONLY)     I have ordered a pelvic ultrasound, but will screen the patient for STDs, too.  Her exam is not consistent with criteria for PID. We discussed various other etiologies of pelvic pain, which include endometriosis.  She states that she is tolerating her pain fairly well, but would like to get an ultrasound to make sure.    15 minutes spent in face to face discussion with > 50% spent in counseling,management, and coordination of care of her acute pelvic pain.   Thomasene Mohair, MD 12/29/2017 4:38 PM

## 2017-12-30 ENCOUNTER — Encounter: Payer: Self-pay | Admitting: Obstetrics and Gynecology

## 2018-01-01 LAB — URINE CULTURE

## 2018-01-03 LAB — CHLAMYDIA/GONOCOCCUS/TRICHOMONAS, NAA
CHLAMYDIA BY NAA: POSITIVE — AB
GONOCOCCUS BY NAA: POSITIVE — AB
TRICH VAG BY NAA: NEGATIVE

## 2018-01-05 ENCOUNTER — Encounter: Payer: Self-pay | Admitting: Obstetrics and Gynecology

## 2018-01-05 ENCOUNTER — Other Ambulatory Visit: Payer: Self-pay | Admitting: Obstetrics and Gynecology

## 2018-01-05 ENCOUNTER — Telehealth: Payer: Self-pay | Admitting: Obstetrics and Gynecology

## 2018-01-05 DIAGNOSIS — A549 Gonococcal infection, unspecified: Secondary | ICD-10-CM

## 2018-01-05 DIAGNOSIS — R102 Pelvic and perineal pain: Secondary | ICD-10-CM

## 2018-01-05 DIAGNOSIS — A749 Chlamydial infection, unspecified: Secondary | ICD-10-CM

## 2018-01-05 MED ORDER — CEFTRIAXONE SODIUM 500 MG IJ SOLR
250.0000 mg | Freq: Once | INTRAMUSCULAR | Status: AC
  Administered 2018-01-09: 250 mg via INTRAMUSCULAR

## 2018-01-05 MED ORDER — DOXYCYCLINE HYCLATE 100 MG PO CAPS: 100.0000 mg | ORAL_CAPSULE | Freq: Two times a day (BID) | ORAL | 0 refills | Status: AC

## 2018-01-05 NOTE — Telephone Encounter (Signed)
Discussed lab findings of both gonorrhea and chlamydia.  Discussed that both she and her partner need treatment. I will put in order for both. She understands she will need an injection in the clinic and to take a medication for two weeks. Will put in order for ceftriaxone 250 mg IM and doxycycline 100 mg po bid x 2 weeks. She voiced understanding that both her and her partner will need to be treated.  Thomasene MohairStephen Jackson, MD, Merlinda FrederickFACOG [facility], [facility] 01/05/2018 11:16 AM

## 2018-01-09 ENCOUNTER — Ambulatory Visit (INDEPENDENT_AMBULATORY_CARE_PROVIDER_SITE_OTHER): Payer: Medicaid Other

## 2018-01-09 ENCOUNTER — Ambulatory Visit: Payer: Medicaid Other

## 2018-01-09 DIAGNOSIS — A749 Chlamydial infection, unspecified: Secondary | ICD-10-CM | POA: Diagnosis not present

## 2018-01-09 DIAGNOSIS — R102 Pelvic and perineal pain: Secondary | ICD-10-CM

## 2018-01-09 DIAGNOSIS — A549 Gonococcal infection, unspecified: Secondary | ICD-10-CM

## 2018-01-09 NOTE — Progress Notes (Signed)
Pt presents for Rocephin 250mg  injection. Given IM RUOQ.

## 2018-01-10 ENCOUNTER — Telehealth: Payer: Self-pay

## 2018-01-10 NOTE — Telephone Encounter (Signed)
[facility] if pt has been tx'd for positive GC/CT.  Adv pt was given Rocephin 250mg  IM yesterday and given rx for Doxycycline 100mg  one po bid x14d.

## 2018-01-20 ENCOUNTER — Ambulatory Visit (INDEPENDENT_AMBULATORY_CARE_PROVIDER_SITE_OTHER): Payer: Medicaid Other

## 2018-01-20 ENCOUNTER — Ambulatory Visit (INDEPENDENT_AMBULATORY_CARE_PROVIDER_SITE_OTHER): Payer: Medicaid Other | Admitting: Obstetrics and Gynecology

## 2018-01-20 ENCOUNTER — Encounter: Payer: Self-pay | Admitting: Obstetrics and Gynecology

## 2018-01-20 VITALS — BP 102/62 | HR 90 | Ht 62.0 in | Wt 118.0 lb

## 2018-01-20 DIAGNOSIS — N739 Female pelvic inflammatory disease, unspecified: Secondary | ICD-10-CM

## 2018-01-20 DIAGNOSIS — R102 Pelvic and perineal pain: Secondary | ICD-10-CM

## 2018-01-20 MED ORDER — CEFTRIAXONE SODIUM 500 MG IJ SOLR
250.0000 mg | Freq: Once | INTRAMUSCULAR | Status: AC
  Administered 2018-01-20: 250 mg via INTRAMUSCULAR

## 2018-01-20 NOTE — Progress Notes (Signed)
Gynecology Ultrasound Follow Up   Chief Complaint  Patient presents with  . Follow-up    Pelvic U/S  Pelvic pain, PID   History of Present Illness: Patient is a 23 y.o. female who presents today for ultrasound evaluation of the above .  Ultrasound demonstrates the following findings Adnexa: no masses seen  Uterus: anteverted  with endometrial stripe  8.9 mm Additional: no other findings. No abscesses noted.   She is still having bilateral pelvic pain, slightly improved.   Past Medical History:  Diagnosis Date  . Anemia   . Environmental and seasonal allergies     Past Surgical History:  Procedure Laterality Date  . WISDOM TOOTH EXTRACTION     age 51 - all four    Family History  Problem Relation Age of Onset  . Hypertension Maternal Grandmother   . Breast cancer Other   . Cancer Other 72       lung    Social History   Socioeconomic History  . Marital status: Single    Spouse name: Not on file  . Number of children: 1  . Years of education: 55  . Highest education level: Not on file  Occupational History  . Occupation: UNEMPLOYED  Social Needs  . Financial resource strain: Not on file  . Food insecurity:    Worry: Not on file    Inability: Not on file  . Transportation needs:    Medical: Not on file    Non-medical: Not on file  Tobacco Use  . Smoking status: Current Every Day Smoker    Packs/day: 1.00    Types: Cigarettes    Last attempt to quit: 04/28/2015    Years since quitting: 2.7  . Smokeless tobacco: Never Used  . Tobacco comment: quit with positive pregnancy test  Substance and Sexual Activity  . Alcohol use: Yes    Alcohol/week: 6.0 standard drinks    Types: 6 Shots of liquor per week    Frequency: Never    Comment: weekends  . Drug use: Yes    Types: Marijuana    Comment: last use yesterday  . Sexual activity: Yes    Birth control/protection: None  Lifestyle  . Physical activity:    Days per week: Not on file    Minutes per  session: Not on file  . Stress: Not on file  Relationships  . Social connections:    Talks on phone: Not on file    Gets together: Not on file    Attends religious service: Not on file    Active member of club or organization: Not on file    Attends meetings of clubs or organizations: Not on file    Relationship status: Not on file  . Intimate partner violence:    Fear of current or ex partner: No    Emotionally abused: No    Physically abused: No    Forced sexual activity: No  Other Topics Concern  . Not on file  Social History Narrative  . Not on file    Allergies  Allergen Reactions  . Vicodin [Hydrocodone-Acetaminophen] Itching    Prior to Admission medications   Medication Sig Start Date End Date Taking? Authorizing Provider  Prenat-Fe Carbonyl-FA-Omega 3 (ONE-A-DAY WOMENS PRENATAL 1) 28-0.8-235 MG CAPS Take 1 capsule by mouth daily. Patient not taking: Reported on 08/27/2017 02/18/17   Oswaldo Conroy, CNM  vitamin B-12 (CYANOCOBALAMIN) 250 MCG tablet Take 250 mcg by mouth daily.    [provider]    Physical Exam BP 102/62 (BP Location: Left Arm, Patient Position: Sitting, Cuff Size: Normal)   Pulse 90   Ht 5\' 2"  (1.575 m)   Wt 118 lb (53.5 kg)   LMP  (LMP Unknown)   Breastfeeding No   BMI 21.58 kg/m    General: NAD HEENT: normocephalic, anicteric Pulmonary: No increased work of breathing Extremities: no edema, erythema, or tenderness Neurologic: Grossly intact, normal gait Psychiatric: mood appropriate, affect full  Imaging Results: US Pelvis Transvanginal Non-ob (tv Only)  Result Date: 01/20/2018 Patient Name: Jennifer [name] DOB: 1995-06-09 MRN: [id number] ULTRASOUND REPORT Location: [facility] Date of Service: 01/20/2018 Indications:Pelvic Pain Findings: The uterus is anteverted and measures 9.6 x 5.5 x 4.6cm. Echo texture is homogenous without evidence of focal masses. The Endometrium measures 8.9 mm. Right Ovary measures 3.4 x 2.8 x 1.9cm  with dominant follicle seen. Left Ovary measures 3.0 x 2.0 x 1.5cm. It is normal in appearance. Survey of the adnexa demonstrates no adnexal masses. Small amount of fluid within cervical canal. Impression:  Normal gyn ultrasound. Darlina Guys, RDMS RVT The ultrasound images and findings were reviewed by me and I agree with the above report. Thomasene Mohair, MD, [facility], [facility] 01/20/2018 3:56 PM       Assessment: 22 y.o. [id number]  1. Pelvic inflammatory disease      Plan: Problem List Items Addressed This Visit    None    Visit Diagnoses    Pelvic inflammatory disease    -  Primary   Relevant Medications   cefTRIAXone (ROCEPHIN) injection 250 mg (Start on 01/20/2018  4:15 PM)     Repeat Rocephin dosing as she did receive the first, has not taken doxycycline.  She has also been sexually active with her boyfriend who likely also has gonorrhea and chlamydia.  So, will retreat in case she has been re-infected.   She states that the doxycycline has now been picked up by her grandmother and she will start that today. SHe also states that she will refrain from intercourse until he has completed his course of antibiotics and a week has passed after that.  Discussed risk of TOA without treatment, which can be a much more serious treatment regimen.   20 minutes spent in face to face discussion with > 50% spent in counseling,management, and coordination of care of her pelvic inflammatory disease and imaging.  Thomasene Mohair, MD, [facility], [facility] 01/20/2018 4:07 PM

## 2018-01-25 ENCOUNTER — Ambulatory Visit: Payer: Medicaid Other | Admitting: Maternal Newborn

## 2018-02-24 ENCOUNTER — Emergency Department
Admission: EM | Admit: 2018-02-24 | Discharge: 2018-02-24 | Disposition: A | Discharge status: Home / Self Care | Payer: Medicaid Other | Attending: Emergency Medicine | Admitting: Emergency Medicine

## 2018-02-24 ENCOUNTER — Other Ambulatory Visit: Payer: Self-pay

## 2018-02-24 DIAGNOSIS — Z041 Encounter for examination and observation following transport accident: Secondary | ICD-10-CM | POA: Insufficient documentation

## 2018-02-24 DIAGNOSIS — Z3202 Encounter for pregnancy test, result negative: Secondary | ICD-10-CM | POA: Diagnosis not present

## 2018-02-24 DIAGNOSIS — F17210 Nicotine dependence, cigarettes, uncomplicated: Secondary | ICD-10-CM | POA: Insufficient documentation

## 2018-02-24 LAB — POCT PREGNANCY, URINE: Preg Test, Ur: NEGATIVE
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
## 2018-02-28 DIAGNOSIS — Z653 Problems related to other legal circumstances: Secondary | ICD-10-CM

## 2018-02-28 HISTORY — DX: Problems related to other legal circumstances: Z65.3

## 2018-03-07 ENCOUNTER — Encounter: Payer: Self-pay | Admitting: Maternal Newborn

## 2018-03-07 ENCOUNTER — Ambulatory Visit (INDEPENDENT_AMBULATORY_CARE_PROVIDER_SITE_OTHER): Payer: Medicaid Other | Admitting: Maternal Newborn

## 2018-03-07 ENCOUNTER — Other Ambulatory Visit (HOSPITAL_COMMUNITY)
Admission: RE | Admit: 2018-03-07 | Discharge: 2018-03-07 | Disposition: A | Discharge status: Home / Self Care | Payer: Medicaid Other | Source: Ambulatory Visit | Attending: Maternal Newborn | Admitting: Maternal Newborn

## 2018-03-07 VITALS — BP 100/60 | Wt 120.0 lb

## 2018-03-07 DIAGNOSIS — Z3A01 Less than 8 weeks gestation of pregnancy: Secondary | ICD-10-CM

## 2018-03-07 DIAGNOSIS — Z3201 Encounter for pregnancy test, result positive: Secondary | ICD-10-CM | POA: Diagnosis not present

## 2018-03-07 DIAGNOSIS — F17210 Nicotine dependence, cigarettes, uncomplicated: Secondary | ICD-10-CM | POA: Diagnosis not present

## 2018-03-07 DIAGNOSIS — O09899 Supervision of other high risk pregnancies, unspecified trimester: Secondary | ICD-10-CM | POA: Insufficient documentation

## 2018-03-07 DIAGNOSIS — Z3480 Encounter for supervision of other normal pregnancy, unspecified trimester: Secondary | ICD-10-CM | POA: Insufficient documentation

## 2018-03-07 DIAGNOSIS — O99331 Smoking (tobacco) complicating pregnancy, first trimester: Secondary | ICD-10-CM

## 2018-03-07 DIAGNOSIS — Z369 Encounter for antenatal screening, unspecified: Secondary | ICD-10-CM

## 2018-03-07 DIAGNOSIS — O99321 Drug use complicating pregnancy, first trimester: Secondary | ICD-10-CM

## 2018-03-07 DIAGNOSIS — O99330 Smoking (tobacco) complicating pregnancy, unspecified trimester: Secondary | ICD-10-CM | POA: Insufficient documentation

## 2018-03-07 DIAGNOSIS — F1290 Cannabis use, unspecified, uncomplicated: Secondary | ICD-10-CM

## 2018-03-07 LAB — POCT URINALYSIS DIPSTICK OB
Glucose, UA: NEGATIVE
PROTEIN: NEGATIVE

## 2018-03-07 LAB — POCT URINE PREGNANCY: PREG TEST UR: POSITIVE — AB

## 2018-03-07 MED ORDER — DOXYLAMINE-PYRIDOXINE 10-10 MG PO TBEC: 20.0000 mg | DELAYED_RELEASE_TABLET | Freq: Every day | ORAL | 3 refills | Status: DC

## 2018-03-07 NOTE — Progress Notes (Signed)
03/07/2018   Chief Complaint: Desires prenatal care.  Transfer of Care Patient: No  History of Present Illness: Ms. Rybolt is a 23 y.o. G3P2002 at [redacted]w[redacted]d based on Patient's last menstrual period on 02/04/2018 (exact date), with an Estimated Date of Delivery: 11/11/2018, with the above CC.   Her periods were: regular periods every 28 days, lasting 5 days She has Positive signs or symptoms of nausea/vomiting of pregnancy. She has Negative signs or symptoms of miscarriage or preterm labor She identifies Negative Zika risk factors for her and her partner On any different medications around the time she conceived/early pregnancy: No  History of varicella: No   Review of Systems  Constitutional:       Change in appetite  HENT: Positive for congestion.   Eyes: Negative.   Respiratory: Negative for shortness of breath and wheezing.   Cardiovascular: Negative for chest pain and palpitations.  Gastrointestinal: Positive for nausea.  Genitourinary: Negative.   Musculoskeletal: Negative.   Skin: Negative.   Neurological: Negative.   Endo/Heme/Allergies: Negative.   Psychiatric/Behavioral: Negative.    ROS: Review of systems was otherwise negative, except as stated in the above HPI.  OBGYN History: As per HPI. OB History  Gravida Para Term Preterm AB Living  SAB TAB Ectopic Multiple Live Births        0 2    # Outcome Date GA Lbr Len/2nd Weight Sex Delivery Anes PTL Lv  3 Current           2 Term 08/27/17 [redacted]w[redacted]d 11:23 / 00:05 8 lb 11.3 oz (3.95 kg) F Vag-Spont EPI  LIV  1 Term 09/03/15 [redacted]w[redacted]d / 00:24 7 lb 7.2 oz (3.38 kg) M Vag-Spont Gen  LIV    Obstetric Comments  IOL for postdates with G1    Any issues with any prior pregnancies: Anemia Any prior children are healthy, doing well, without any problems or issues: Yes History of pap smears: No, not indicated < 21. Will need PP as she will be 23 at that time. History of STIs: Yes, gonorrhea and chlamydia. Has a separate  appointment with the health department today for possible STD exposure   Past Medical History: Past Medical History:  Diagnosis Date  . Anemia   . Environmental and seasonal allergies     Past Surgical History: Past Surgical History:  Procedure Laterality Date  . WISDOM TOOTH EXTRACTION     age 16 - all four    Family History:  Family History  Problem Relation Age of Onset  . Hypertension Maternal Grandmother   . Breast cancer Other   . Cancer Other 74       lung   She denies any female cancers, bleeding or blood clotting disorders.  She denies any history of intellectual disability, birth defects or genetic disorders in her or the FOB's history  Social History:  Social History   Socioeconomic History  . Marital status: Single    Spouse name: Not on file  . Number of children: 1  . Years of education: 46  . Highest education level: Not on file  Occupational History  . Occupation: UNEMPLOYED  Social Needs  . Financial resource strain: Not on file  . Food insecurity:    Worry: Not on file    Inability: Not on file  . Transportation needs:    Medical: Not on file    Non-medical: Not on file  Tobacco Use  . Smoking status:  Current Every Day Smoker    Packs/day: 1.00    Types: Cigarettes    Last attempt to quit: 04/28/2015    Years since quitting: 2.8  . Smokeless tobacco: Never Used  Substance and Sexual Activity  . Alcohol use: Not Currently    Frequency: Never  . Drug use: Yes    Types: Marijuana    Comment: last use yesterday  . Sexual activity: Yes    Birth control/protection: None  Lifestyle  . Physical activity:    Days per week: Not on file    Minutes per session: Not on file  . Stress: Not on file  Relationships  . Social connections:    Talks on phone: Not on file    Gets together: Not on file    Attends religious service: Not on file    Active member of club or organization: Not on file    Attends meetings of clubs or organizations: Not on  file    Relationship status: Not on file  . Intimate partner violence:    Fear of current or ex partner: No    Emotionally abused: No    Physically abused: No    Forced sexual activity: No  Other Topics Concern  . Not on file  Social History Narrative  . Not on file   Any cats in the household: no Domestic violence screening negative.  Allergy: Allergies  Allergen Reactions  . Vicodin [Hydrocodone-Acetaminophen] Itching    Current Outpatient Medications:  Current Outpatient Medications:  .  Doxylamine-Pyridoxine (DICLEGIS) 10-10 MG TBEC, Take 20 mg by mouth at bedtime. May take an additional 10 mg (1 tab) in the morning and 10 mg (1 tab) at midday as needed. No more than 4 tabs/day., Disp: 60 tablet, Rfl: 3 .  vitamin B-12 (CYANOCOBALAMIN) 250 MCG tablet, Take 250 mcg by mouth daily., Disp: , Rfl:    Physical Exam:   BP 100/60   Wt 120 lb (54.4 kg)   LMP 02/04/2018 (Exact Date)   BMI 21.95 kg/m  Body mass index is 21.95 kg/m. Constitutional: Well nourished, well developed female in no acute distress.  Neck:  Supple, normal appearance, and no thyromegaly  Cardiovascular: S1, S2 normal, no murmur, rub or gallop, regular rate and rhythm Respiratory:  Clear to auscultation bilaterally. Normal respiratory effort Abdomen: no masses, hernias; diffusely non tender to palpation, non distended Breasts: patient declines to have breast exam. Neuro/Psych:  Normal mood and affect.  Skin:  Warm and dry.  Lymphatic:  No inguinal lymphadenopathy.   Pelvic exam: Patient declines, will have exam today during appointment at health department for STD exposure.  Assessment: Ms. Demars is a 23 y.o. G3P2002 at [redacted]w[redacted]d based on Patient's last menstrual period on 02/04/2018 (exact date), with an Estimated Date of Delivery: 11/11/2018, presenting for prenatal care.  Plan:  1) Avoid alcoholic beverages. 2) Patient encouraged not to smoke. She is currently smoking cigarettes (about 5/day) 3)  Discontinue the use of all non-medicinal drugs and chemicals. She is currently smoking marijuana, counseled cessation.  4) Take prenatal vitamins daily.  5) Seatbelt use advised 6) Nutrition, food safety (fish, cheese advisories, and high nitrite foods) and exercise discussed. 7) Hospital and practice style delivering at [facility] discussed  8) Patient is asked about travel to areas at risk for the Zika virus, and counseled to avoid travel and exposure to mosquitoes or sexual partners who may have themselves been exposed to the virus. Testing is discussed, and will be ordered as appropriate.  9) Genetic Screening, such as with 1st Trimester Screening, cell free fetal DNA, AFP testing, and Ultrasound is discussed with patient. She plans to decline genetic testing this pregnancy. 10) NOB labs and dating scan ordered.  Problem list reviewed and updated.  Return in about 2 weeks (around 03/21/2018) for ROB and dating scan.  [name], CNM [facility], [facility] 03/07/2018

## 2018-03-07 NOTE — Patient Instructions (Signed)
First Trimester of Pregnancy  The first trimester of pregnancy is from week 1 until the end of week 13 (months 1 through 3). A week after a sperm fertilizes an egg, the egg will implant on the wall of the uterus. This embryo will begin to develop into a baby. Genes from you and your partner will form the baby. The female genes will determine whether the baby will be a boy or a girl. At 6-8 weeks, the eyes and face will be formed, and the heartbeat can be seen on ultrasound. At the end of 12 weeks, all the baby's organs will be formed.  Now that you are pregnant, you will want to do everything you can to have a healthy baby. Two of the most important things are to get good prenatal care and to follow your health care provider's instructions. Prenatal care is all the medical care you receive before the baby's birth. This care will help prevent, find, and treat any problems during the pregnancy and childbirth.  Body changes during your first trimester  Your body goes through many changes during pregnancy. The changes vary from woman to woman.   You may gain or lose a couple of pounds at first.   You may feel sick to your stomach (nauseous) and you may throw up (vomit). If the vomiting is uncontrollable, call your health care provider.   You may tire easily.   You may develop headaches that can be relieved by medicines. All medicines should be approved by your health care provider.   You may urinate more often. Painful urination may mean you have a bladder infection.   You may develop heartburn as a result of your pregnancy.   You may develop constipation because certain hormones are causing the muscles that push stool through your intestines to slow down.   You may develop hemorrhoids or swollen veins (varicose veins).   Your breasts may begin to grow larger and become tender. Your nipples may stick out more, and the tissue that surrounds them (areola) may become darker.   Your gums may bleed and may be  sensitive to brushing and flossing.   Dark spots or blotches (chloasma, mask of pregnancy) may develop on your face. This will likely fade after the baby is born.   Your menstrual periods will stop.   You may have a loss of appetite.   You may develop cravings for certain kinds of food.   You may have changes in your emotions from day to day, such as being excited to be pregnant or being concerned that something may go wrong with the pregnancy and baby.   You may have more vivid and strange dreams.   You may have changes in your hair. These can include thickening of your hair, rapid growth, and changes in texture. Some women also have hair loss during or after pregnancy, or hair that feels dry or thin. Your hair will most likely return to normal after your baby is born.  What to expect at prenatal visits  During a routine prenatal visit:   You will be weighed to make sure you and the baby are growing normally.   Your blood pressure will be taken.   Your abdomen will be measured to track your baby's growth.   The fetal heartbeat will be listened to between weeks 10 and 14 of your pregnancy.   Test results from any previous visits will be discussed.  Your health care provider may ask you:     How you are feeling.   If you are feeling the baby move.   If you have had any abnormal symptoms, such as leaking fluid, bleeding, severe headaches, or abdominal cramping.   If you are using any tobacco products, including cigarettes, chewing tobacco, and electronic cigarettes.   If you have any questions.  Other tests that may be performed during your first trimester include:   Blood tests to find your blood type and to check for the presence of any previous infections. The tests will also be used to check for low iron levels (anemia) and protein on red blood cells (Rh antibodies). Depending on your risk factors, or if you previously had diabetes during pregnancy, you may have tests to check for high blood sugar  that affects pregnant women (gestational diabetes).   Urine tests to check for infections, diabetes, or protein in the urine.   An ultrasound to confirm the proper growth and development of the baby.   Fetal screens for spinal cord problems (spina bifida) and Down syndrome.   HIV (human immunodeficiency virus) testing. Routine prenatal testing includes screening for HIV, unless you choose not to have this test.   You may need other tests to make sure you and the baby are doing well.  Follow these instructions at home:  Medicines   Follow your health care provider's instructions regarding medicine use. Specific medicines may be either safe or unsafe to take during pregnancy.   Take a prenatal vitamin that contains at least 600 micrograms (mcg) of folic acid.   If you develop constipation, try taking a stool softener if your health care provider approves.  Eating and drinking     Eat a balanced diet that includes fresh fruits and vegetables, whole grains, good sources of protein such as meat, eggs, or tofu, and low-fat dairy. Your health care provider will help you determine the amount of weight gain that is right for you.   Avoid raw meat and uncooked cheese. These carry germs that can cause birth defects in the baby.   Eating four or five small meals rather than three large meals a day may help relieve nausea and vomiting. If you start to feel nauseous, eating a few soda crackers can be helpful. Drinking liquids between meals, instead of during meals, also seems to help ease nausea and vomiting.   Limit foods that are high in fat and processed sugars, such as fried and sweet foods.   To prevent constipation:  ? Eat foods that are high in fiber, such as fresh fruits and vegetables, whole grains, and beans.  ? Drink enough fluid to keep your urine clear or pale yellow.  Activity   Exercise only as directed by your health care provider. Most women can continue their usual exercise routine during  pregnancy. Try to exercise for 30 minutes at least 5 days a week. Exercising will help you:  ? Control your weight.  ? Stay in shape.  ? Be prepared for labor and delivery.   Experiencing pain or cramping in the lower abdomen or lower back is a good sign that you should stop exercising. Check with your health care provider before continuing with normal exercises.   Try to avoid standing for long periods of time. Move your legs often if you must stand in one place for a long time.   Avoid heavy lifting.   Wear low-heeled shoes and practice good posture.   You may continue to have sex unless your health care   provider tells you not to.  Relieving pain and discomfort   Wear a good support bra to relieve breast tenderness.   Take warm sitz baths to soothe any pain or discomfort caused by hemorrhoids. Use hemorrhoid cream if your health care provider approves.   Rest with your legs elevated if you have leg cramps or low back pain.   If you develop varicose veins in your legs, wear support hose. Elevate your feet for 15 minutes, 3-4 times a day. Limit salt in your diet.  Prenatal care   Schedule your prenatal visits by the twelfth week of pregnancy. They are usually scheduled monthly at first, then more often in the last 2 months before delivery.   Write down your questions. Take them to your prenatal visits.   Keep all your prenatal visits as told by your health care provider. This is important.  Safety   Wear your seat belt at all times when driving.   Make a list of emergency phone numbers, including numbers for family, friends, the hospital, and police and fire departments.  General instructions   Ask your health care provider for a referral to a local prenatal education class. Begin classes no later than the beginning of month 6 of your pregnancy.   Ask for help if you have counseling or nutritional needs during pregnancy. Your health care provider can offer advice or refer you to specialists for help  with various needs.   Do not use hot tubs, steam rooms, or saunas.   Do not douche or use tampons or scented sanitary pads.   Do not cross your legs for long periods of time.   Avoid cat litter boxes and soil used by cats. These carry germs that can cause birth defects in the baby and possibly loss of the fetus by miscarriage or stillbirth.   Avoid all smoking, herbs, alcohol, and medicines not prescribed by your health care provider. Chemicals in these products affect the formation and growth of the baby.   Do not use any products that contain nicotine or tobacco, such as cigarettes and e-cigarettes. If you need help quitting, ask your health care provider. You may receive counseling support and other resources to help you quit.   Schedule a dentist appointment. At home, brush your teeth with a soft toothbrush and be gentle when you floss.  Contact a health care provider if:   You have dizziness.   You have mild pelvic cramps, pelvic pressure, or nagging pain in the abdominal area.   You have persistent nausea, vomiting, or diarrhea.   You have a bad smelling vaginal discharge.   You have pain when you urinate.   You notice increased swelling in your face, hands, legs, or ankles.   You are exposed to fifth disease or chickenpox.   You are exposed to German measles (rubella) and have never had it.  Get help right away if:   You have a fever.   You are leaking fluid from your vagina.   You have spotting or bleeding from your vagina.   You have severe abdominal cramping or pain.   You have rapid weight gain or loss.   You vomit blood or material that looks like coffee grounds.   You develop a severe headache.   You have shortness of breath.   You have any kind of trauma, such as from a fall or a car accident.  Summary   The first trimester of pregnancy is from week 1 until   the end of week 13 (months 1 through 3).   Your body goes through many changes during pregnancy. The changes vary from  woman to woman.   You will have routine prenatal visits. During those visits, your health care provider will examine you, discuss any test results you may have, and talk with you about how you are feeling.  This information is not intended to replace advice given to you by your health care provider. Make sure you discuss any questions you have with your health care provider.  Document Released: 12/22/2000 Document Revised: 12/10/2015 Document Reviewed: 12/10/2015  Elsevier Interactive Patient Education  2019 [employer]

## 2018-03-08 LAB — RPR+RH+ABO+RUB AB+AB SCR+CB...
Antibody Screen: NEGATIVE
HEMATOCRIT: 36 % (ref 34.0–46.6)
HEMOGLOBIN: 12.2 g/dL (ref 11.1–15.9)
HEP B S AG: NEGATIVE
HIV Screen 4th Generation wRfx: NONREACTIVE
MCH: 29.5 pg (ref 26.6–33.0)
MCHC: 33.9 g/dL (ref 31.5–35.7)
MCV: 87 fL (ref 79–97)
Platelets: 212 10*3/uL (ref 150–450)
RBC: 4.13 x10E6/uL (ref 3.77–5.28)
RDW: 12 % (ref 11.7–15.4)
RH TYPE: NEGATIVE
RPR Ser Ql: NONREACTIVE
RUBELLA: 5.07 {index} (ref >0.99)
Varicella zoster IgG: 748 index (ref >165)
WBC: 9.3 10*3/uL (ref 3.4–10.8)

## 2018-03-09 LAB — URINE DRUG PANEL 7

## 2018-03-09 LAB — URINE CULTURE

## 2018-03-09 LAB — CERVICOVAGINAL ANCILLARY ONLY
CHLAMYDIA, DNA PROBE: POSITIVE — AB
Neisseria Gonorrhea: POSITIVE — AB
Trichomonas: NEGATIVE

## 2018-03-14 ENCOUNTER — Other Ambulatory Visit: Payer: Self-pay | Admitting: Maternal Newborn

## 2018-03-14 DIAGNOSIS — Z3480 Encounter for supervision of other normal pregnancy, unspecified trimester: Secondary | ICD-10-CM

## 2018-03-14 DIAGNOSIS — O219 Vomiting of pregnancy, unspecified: Secondary | ICD-10-CM

## 2018-03-14 MED ORDER — DICLEGIS 10-10 MG PO TBEC: 2.0000 | DELAYED_RELEASE_TABLET | Freq: Every day | ORAL | 5 refills | Status: DC

## 2018-03-14 NOTE — Progress Notes (Signed)
Rx for brand name Diclegis.

## 2018-03-21 ENCOUNTER — Ambulatory Visit: Payer: Medicaid Other

## 2018-03-21 ENCOUNTER — Encounter: Payer: Medicaid Other | Admitting: Advanced Practice Midwife

## 2018-04-14 DIAGNOSIS — Z79899 Other long term (current) drug therapy: Secondary | ICD-10-CM | POA: Diagnosis not present

## 2018-04-14 DIAGNOSIS — S01112A Laceration without foreign body of left eyelid and periocular area, initial encounter: Secondary | ICD-10-CM | POA: Insufficient documentation

## 2018-04-14 DIAGNOSIS — M79642 Pain in left hand: Secondary | ICD-10-CM | POA: Diagnosis not present

## 2018-04-14 DIAGNOSIS — F17210 Nicotine dependence, cigarettes, uncomplicated: Secondary | ICD-10-CM | POA: Insufficient documentation

## 2018-04-14 DIAGNOSIS — F10120 Alcohol abuse with intoxication, uncomplicated: Secondary | ICD-10-CM | POA: Diagnosis not present

## 2018-04-14 DIAGNOSIS — Y9389 Activity, other specified: Secondary | ICD-10-CM | POA: Diagnosis not present

## 2018-04-14 DIAGNOSIS — Y92410 Unspecified street and highway as the place of occurrence of the external cause: Secondary | ICD-10-CM | POA: Insufficient documentation

## 2018-04-14 DIAGNOSIS — Y999 Unspecified external cause status: Secondary | ICD-10-CM | POA: Diagnosis not present

## 2018-04-14 DIAGNOSIS — S0990XA Unspecified injury of head, initial encounter: Secondary | ICD-10-CM | POA: Insufficient documentation
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
## 2018-04-15 ENCOUNTER — Emergency Department: Payer: Medicaid Other

## 2018-04-15 ENCOUNTER — Other Ambulatory Visit: Payer: Self-pay

## 2018-04-15 ENCOUNTER — Encounter: Payer: Self-pay | Admitting: Emergency Medicine

## 2018-04-15 ENCOUNTER — Emergency Department
Admission: EM | Admit: 2018-04-15 | Discharge: 2018-04-15 | Payer: Medicaid Other | Attending: Emergency Medicine | Admitting: Emergency Medicine

## 2018-04-15 DIAGNOSIS — S0181XA Laceration without foreign body of other part of head, initial encounter: Secondary | ICD-10-CM

## 2018-04-15 DIAGNOSIS — S0990XA Unspecified injury of head, initial encounter: Secondary | ICD-10-CM

## 2018-04-15 DIAGNOSIS — F10929 Alcohol use, unspecified with intoxication, unspecified: Secondary | ICD-10-CM
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
## 2018-04-15 NOTE — ED Notes (Signed)
Patient transported to CT 

## 2018-04-15 NOTE — ED Notes (Signed)
Pt states she had abortion 2 weeks ago, and is no longer pregnant. Per MD go ahead with CT and shield pts stomach. Pt unable to give urine sample at this time
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
## 2018-07-11 ENCOUNTER — Ambulatory Visit: Payer: Medicaid Other | Admitting: Licensed Clinical Social Worker

## 2019-01-12 NOTE — L&D Delivery Note (Signed)
Delivery Note Primary OB: [facility] Delivery Physician: Annamarie Major, MD Gestational Age: Full term Antepartum complications: none Intrapartum complications: None  A viable Female was delivered via vertex presentation. "Jennifer [name]" Apgars:8 ,8  Weight:  pending .   Placenta status: spontaneous and Intact.  Cord: 3+ vessels;  with the following complications: none.  Anesthesia:  epidural Episiotomy:  none Lacerations:  none Suture Repair: none Est. Blood Loss (mL):  less than 100 mL  Mom to postpartum.  Baby to Couplet care / Skin to Skin.  Annamarie Major, MD, [facility], [facility] 11/30/2019  10:16 PM [phone]

## 2019-02-27 NOTE — Telephone Encounter (Signed)
NO SHOW

## 2019-05-09 ENCOUNTER — Ambulatory Visit (INDEPENDENT_AMBULATORY_CARE_PROVIDER_SITE_OTHER): Payer: Medicaid Other | Admitting: Advanced Practice Midwife

## 2019-05-09 ENCOUNTER — Other Ambulatory Visit: Payer: Self-pay

## 2019-05-09 ENCOUNTER — Encounter: Payer: Self-pay | Admitting: Advanced Practice Midwife

## 2019-05-09 ENCOUNTER — Other Ambulatory Visit (HOSPITAL_COMMUNITY)
Admission: RE | Admit: 2019-05-09 | Discharge: 2019-05-09 | Disposition: A | Discharge status: Home / Self Care | Payer: Medicaid Other | Source: Ambulatory Visit | Attending: Advanced Practice Midwife | Admitting: Advanced Practice Midwife

## 2019-05-09 VITALS — BP 100/69 | HR 103 | Wt 119.0 lb

## 2019-05-09 DIAGNOSIS — Z124 Encounter for screening for malignant neoplasm of cervix: Secondary | ICD-10-CM | POA: Diagnosis present

## 2019-05-09 DIAGNOSIS — N912 Amenorrhea, unspecified: Secondary | ICD-10-CM | POA: Diagnosis not present

## 2019-05-09 DIAGNOSIS — Z113 Encounter for screening for infections with a predominantly sexual mode of transmission: Secondary | ICD-10-CM | POA: Insufficient documentation

## 2019-05-09 DIAGNOSIS — Z3481 Encounter for supervision of other normal pregnancy, first trimester: Secondary | ICD-10-CM | POA: Diagnosis present

## 2019-05-09 LAB — POCT URINE PREGNANCY: Preg Test, Ur: POSITIVE — AB

## 2019-05-09 NOTE — Patient Instructions (Signed)
Managing Anxiety, Adult After being diagnosed with an anxiety disorder, you may be relieved to know why you have felt or behaved a certain way. You may also feel overwhelmed about the treatment ahead and what it will mean for your life. With care and support, you can manage this condition and recover from it. How to manage lifestyle changes Managing stress and anxiety  Stress is your body's reaction to life changes and events, both good and bad. Most stress will last just a few hours, but stress can be ongoing and can lead to more than just stress. Although stress can play a major role in anxiety, it is not the same as anxiety. Stress is usually caused by something external, such as a deadline, test, or competition. Stress normally passes after the triggering event has ended.  Anxiety is caused by something internal, such as imagining a terrible outcome or worrying that something will go wrong that will devastate you. Anxiety often does not go away even after the triggering event is over, and it can become long-term (chronic) worry. It is important to understand the differences between stress and anxiety and to manage your stress effectively so that it does not lead to an anxious response. Talk with your health care provider or a counselor to learn more about reducing anxiety and stress. He or she may suggest tension reduction techniques, such as:  Music therapy. This can include creating or listening to music that you enjoy and that inspires you.  Mindfulness-based meditation. This involves being aware of your normal breaths while not trying to control your breathing. It can be done while sitting or walking.  Centering prayer. This involves focusing on a word, phrase, or sacred image that means something to you and brings you peace.  Deep breathing. To do this, expand your stomach and inhale slowly through your nose. Hold your breath for 3-5 seconds. Then exhale slowly, letting your stomach muscles  relax.  Self-talk. This involves identifying thought patterns that lead to anxiety reactions and changing those patterns.  Muscle relaxation. This involves tensing muscles and then relaxing them. Choose a tension reduction technique that suits your lifestyle and personality. These techniques take time and practice. Set aside 5-15 minutes a day to do them. Therapists can offer counseling and training in these techniques. The training to help with anxiety may be covered by some insurance plans. Other things you can do to manage stress and anxiety include:  Keeping a stress/anxiety diary. This can help you learn what triggers your reaction and then learn ways to manage your response.  Thinking about how you react to certain situations. You may not be able to control everything, but you can control your response.  Making time for activities that help you relax and not feeling guilty about spending your time in this way.  Visual imagery and yoga can help you stay calm and relax.  Medicines Medicines can help ease symptoms. Medicines for anxiety include:  Anti-anxiety drugs.  Antidepressants. Medicines are often used as a primary treatment for anxiety disorder. Medicines will be prescribed by a health care provider. When used together, medicines, psychotherapy, and tension reduction techniques may be the most effective treatment. Relationships Relationships can play a big part in helping you recover. Try to spend more time connecting with trusted friends and family members. Consider going to couples counseling, taking family education classes, or going to family therapy. Therapy can help you and others better understand your condition. How to recognize changes in your   anxiety Everyone responds differently to treatment for anxiety. Recovery from anxiety happens when symptoms decrease and stop interfering with your daily activities at home or work. This may mean that you will start to:  Have  better concentration and focus. Worry will interfere less in your daily thinking.  Sleep better.  Be less irritable.  Have more energy.  Have improved memory. It is important to recognize when your condition is getting worse. Contact your health care provider if your symptoms interfere with home or work and you feel like your condition is not improving. Follow these instructions at home: Activity  Exercise. Most adults should do the following: ? Exercise for at least 150 minutes each week. The exercise should increase your heart rate and make you sweat (moderate-intensity exercise). ? Strengthening exercises at least twice a week.  Get the right amount and quality of sleep. Most adults need 7-9 hours of sleep each night. Lifestyle   Eat a healthy diet that includes plenty of vegetables, fruits, whole grains, low-fat dairy products, and lean protein. Do not eat a lot of foods that are high in solid fats, added sugars, or salt.  Make choices that simplify your life.  Do not use any products that contain nicotine or tobacco, such as cigarettes, e-cigarettes, and chewing tobacco. If you need help quitting, ask your health care provider.  Avoid caffeine, alcohol, and certain over-the-counter cold medicines. These may make you feel worse. Ask your pharmacist which medicines to avoid. General instructions  Take over-the-counter and prescription medicines only as told by your health care provider.  Keep all follow-up visits as told by your health care provider. This is important. Where to find support You can get help and support from these sources:  Self-help groups.  Online and community organizations.  A trusted spiritual leader.  Couples counseling.  Family education classes.  Family therapy. Where to find more information You may find that joining a support group helps you deal with your anxiety. The following sources can help you locate counselors or support groups near  you:  Mental Health America: www.mentalhealthamerica.net  Anxiety and Depression Association of America (ADAA): www.adaa.org  National Alliance on Mental Illness (NAMI): www.nami.org Contact a health care provider if you:  Have a hard time staying focused or finishing daily tasks.  Spend many hours a day feeling worried about everyday life.  Become exhausted by worry.  Start to have headaches, feel tense, or have nausea.  Urinate more than normal.  Have diarrhea. Get help right away if you have:  A racing heart and shortness of breath.  Thoughts of hurting yourself or others. If you ever feel like you may hurt yourself or others, or have thoughts about taking your own life, get help right away. You can go to your nearest emergency department or call:  Your local emergency services (911 in the [place]  A suicide crisis helpline, such as the National Suicide Prevention Lifeline at 1-800-273-8255. This is open 24 hours a day. Summary  Taking steps to learn and use tension reduction techniques can help calm you and help prevent triggering an anxiety reaction.  When used together, medicines, psychotherapy, and tension reduction techniques may be the most effective treatment.  Family, friends, and partners can play a big part in helping you recover from an anxiety disorder. This information is not intended to replace advice given to you by your health care provider. Make sure you discuss any questions you have with your health care provider. Document Revised:   05/30/2018 Document Reviewed: 05/30/2018 Elsevier Patient Education  2020 [employer] Exercise During Pregnancy Exercise is an important part of being healthy for people of all ages. Exercise improves the function of your heart and lungs and helps you maintain strength, flexibility, and a healthy body weight. Exercise also boosts energy levels and elevates mood. Most women should exercise regularly during pregnancy. In rare  cases, women with certain medical conditions or complications may be asked to limit or avoid exercise during pregnancy. How does this affect me? Along with maintaining general strength and flexibility, exercising during pregnancy can help:  Keep strength in muscles that are used during labor and childbirth.  Decrease low back pain.  Reduce symptoms of depression.  Control weight gain during pregnancy.  Reduce the risk of needing insulin if you develop diabetes during pregnancy.  Decrease the risk of cesarean delivery.  Speed up your recovery after giving birth. How does this affect my baby? Exercise can help you have a healthy pregnancy. Exercise does not cause premature birth. It will not cause your baby to weigh less at birth. What exercises can I do? Many exercises are safe for you to do during pregnancy. Do a variety of exercises that safely increase your heart and breathing rates and help you build and maintain muscle strength. Do exercises exactly as told by your health care provider. You may do these exercises:  Walking or hiking.  Swimming.  Water aerobics.  Riding a stationary bike.  Strength training.  Modified yoga or Pilates. Tell your instructor that you are pregnant. Avoid overstretching, and avoid lying on your back for long periods of time.  Running or jogging. Only choose this type of exercise if you: ? Ran or jogged regularly before your pregnancy. ? Can run or jog and still talk in complete sentences. What exercises should I avoid? Depending on your level of fitness and whether you exercised regularly before your pregnancy, you may be told to limit high-intensity exercise. You can tell that you are exercising at a high intensity if you are breathing much harder and faster and cannot hold a conversation while exercising. You must avoid:  Contact sports.  Activities that put you at risk for falling on or being hit in the belly, such as downhill skiing, water  skiing, surfing, rock climbing, cycling, gymnastics, and horseback riding.  Scuba diving.  Skydiving.  Yoga or Pilates in a room that is heated to high temperatures.  Jogging or running, unless you ran or jogged regularly before your pregnancy. While jogging or running, you should always be able to talk in full sentences. Do not run or jog so fast that you are unable to have a conversation.  Do not exercise at more than 6,000 feet above sea level (high elevation) if you are not used to exercising at high elevation. How do I exercise in a safe way?   Avoid overheating. Do not exercise in very high temperatures.  Wear loose-fitting, breathable clothes.  Avoid dehydration. Drink enough water before, during, and after exercise to keep your urine pale yellow.  Avoid overstretching. Because of hormone changes during pregnancy, it is easy to overstretch muscles, tendons, and ligaments during pregnancy.  Start slowly and ask your health care provider to recommend the types of exercise that are safe for you.  Do not exercise to lose weight. Follow these instructions at home:  Exercise on most days or all days of the week. Try to exercise for 30 minutes a day, 5 days a  week, unless your health care provider tells you not to.  If you actively exercised before your pregnancy and you are healthy, your health care provider may tell you to continue to do moderate to high-intensity exercise.  If you are just starting to exercise or did not exercise much before your pregnancy, your health care provider may tell you to do low to moderate-intensity exercise. Questions to ask your health care provider  Is exercise safe for me?  What are signs that I should stop exercising?  Does my health condition mean that I should not exercise during pregnancy?  When should I avoid exercising during pregnancy? Stop exercising and contact a health care provider if: You have any unusual symptoms, such  as:  Mild contractions of the uterus or cramps in the abdomen.  Dizziness that does not go away when you rest. Stop exercising and get help right away if: You have any unusual symptoms, such as:  Sudden, severe pain in your low back or your belly.  Mild contractions of the uterus or cramps in the abdomen that do not improve with rest and drinking fluids.  Chest pain.  Bleeding or fluid leaking from your vagina.  Shortness of breath. These symptoms may represent a serious problem that is an emergency. Do not wait to see if the symptoms will go away. Get medical help right away. Call your local emergency services (911 in the [place] Do not drive yourself to the hospital. Summary  Most women should exercise regularly throughout pregnancy. In rare cases, women with certain medical conditions or complications may be asked to limit or avoid exercise during pregnancy.  Do not exercise to lose weight during pregnancy.  Your health care provider will tell you what level of physical activity is right for you.  Stop exercising and contact a health care provider if you have mild contractions of the uterus or cramps in the abdomen. Get help right away if these contractions or cramps do not improve with rest and drinking fluids.  Stop exercising and get help right away if you have sudden, severe pain in your low back or belly, chest pain, shortness of breath, or bleeding or leaking of fluid from your vagina. This information is not intended to replace advice given to you by your health care provider. Make sure you discuss any questions you have with your health care provider. Document Revised: 04/20/2018 Document Reviewed: 02/01/2018 Elsevier Patient Education  2020 [employer]. Eating Plan for Pregnant Women While you are pregnant, your body requires additional nutrition to help support your growing baby. You also have a higher need for some vitamins and minerals, such as folic acid, calcium,  iron, and vitamin D. Eating a healthy, well-balanced diet is very important for your health and your baby's health. Your need for extra calories varies for the three 13-month segments of your pregnancy (trimesters). For most women, it is recommended to consume:  150 extra calories a day during the first trimester.  300 extra calories a day during the second trimester.  300 extra calories a day during the third trimester. What are tips for following this plan?   Do not try to lose weight or go on a diet during pregnancy.  Limit your overall intake of foods that have "empty calories." These are foods that have little nutritional value, such as sweets, desserts, candies, and sugar-sweetened beverages.  Eat a variety of foods (especially fruits and vegetables) to get a full range of vitamins and minerals.  Take  a prenatal vitamin to help meet your additional vitamin and mineral needs during pregnancy, specifically for folic acid, iron, calcium, and vitamin D.  Remember to stay active. Ask your health care provider what types of exercise and activities are safe for you.  Practice good food safety and cleanliness. Wash your hands before you eat and after you prepare raw meat. Wash all fruits and vegetables well before peeling or eating. Taking these actions can help to prevent food-borne illnesses that can be very dangerous to your baby, such as listeriosis. Ask your health care provider for more information about listeriosis. What does 150 extra calories look like? Healthy options that provide 150 extra calories each day could be any of the following:  6-8 oz (170-230 g) of plain low-fat yogurt with  cup of berries.  1 apple with 2 teaspoons (11 g) of peanut butter.  Cut-up vegetables with  cup (60 g) of hummus.  8 oz (230 mL) or 1 cup of low-fat chocolate milk.  1 stick of string cheese with 1 medium orange.  1 peanut butter and jelly sandwich that is made with one slice of whole-wheat  bread and 1 tsp (5 g) of peanut butter. For 300 extra calories, you could eat two of those healthy options each day. What is a healthy amount of weight to gain? The right amount of weight gain for you is based on your BMI before you became pregnant. If your BMI:  Was less than 18 (underweight), you should gain 28-40 lb (13-18 kg).  Was 18-24.9 (normal), you should gain 25-35 lb (11-16 kg).  Was 25-29.9 (overweight), you should gain 15-25 lb (7-11 kg).  Was 30 or greater (obese), you should gain 11-20 lb (5-9 kg). What if I am having twins or multiples? Generally, if you are carrying twins or multiples:  You may need to eat 300-600 extra calories a day.  The recommended range for total weight gain is 25-54 lb (11-25 kg), depending on your BMI before pregnancy.  Talk with your health care provider to find out about nutritional needs, weight gain, and exercise that is right for you. What foods can I eat?  Fruits All fruits. Eat a variety of colors and types of fruit. Remember to wash your fruits well before peeling or eating. Vegetables All vegetables. Eat a variety of colors and types of vegetables. Remember to wash your vegetables well before peeling or eating. Grains All grains. Choose whole grains, such as whole-wheat bread, oatmeal, or brown rice. Meats and other protein foods Lean meats, including chicken, [place], fish, and lean cuts of beef, veal, or pork. If you eat fish or seafood, choose options that are higher in omega-3 fatty acids and lower in mercury, such as salmon, herring, mussels, trout, sardines, pollock, shrimp, crab, and lobster. Tofu. Tempeh. Beans. Eggs. Peanut butter and other nut butters. Make sure that all meats, poultry, and eggs are cooked to food-safe temperatures or "well-done." Two or more servings of fish are recommended each week in order to get the most benefits from omega-3 fatty acids that are found in seafood. Choose fish that are lower in mercury. You  can find more information online:  PumpkinSearch.com.ee Dairy Pasteurized milk and milk alternatives (such as almond milk). Pasteurized yogurt and pasteurized cheese. Cottage cheese. Sour cream. Beverages Water. Juices that contain 100% fruit juice or vegetable juice. Caffeine-free teas and decaffeinated coffee. Drinks that contain caffeine are okay to drink, but it is better to avoid caffeine. Keep your total caffeine  intake to less than 200 mg each day (which is 12 oz or 355 mL of coffee, tea, or soda) or the limit as told by your health care provider. Fats and oils Fats and oils are okay to include in moderation. Sweets and desserts Sweets and desserts are okay to include in moderation. Seasoning and other foods All pasteurized condiments. The items listed above may not be a complete list of foods and beverages you can eat. Contact a dietitian for more information. What foods are not recommended? Fruits Unpasteurized fruit juices. Vegetables Raw (unpasteurized) vegetable juices. Meats and other protein foods Lunch meats, bologna, hot dogs, or other deli meats. (If you must eat those meats, reheat them until they are steaming hot.) Refrigerated pat, meat spreads from a meat counter, smoked seafood that is found in the refrigerated section of a store. Raw or undercooked meats, poultry, and eggs. Raw fish, such as sushi or sashimi. Fish that have high mercury content, such as tilefish, shark, swordfish, and king mackerel. To learn more about mercury in fish, talk with your health care provider or look for online resources, such as:  [place]Gaming.ch Dairy Raw (unpasteurized) milk and any foods that have raw milk in them. Soft cheeses, such as feta, queso blanco, queso fresco, Brie, Camembert cheeses, blue-veined cheeses, and Panela cheese (unless it is made with pasteurized milk, which must be stated on the label). Beverages Alcohol. Sugar-sweetened beverages, such as sodas, teas, or energy  drinks. Seasoning and other foods Homemade fermented foods and drinks, such as pickles, sauerkraut, or kombucha drinks. (Store-bought pasteurized versions of these are okay.) Salads that are made in a store or deli, such as ham salad, chicken salad, egg salad, tuna salad, and seafood salad. The items listed above may not be a complete list of foods and beverages you should avoid. Contact a dietitian for more information. Where to find more information To calculate the number of calories you need based on your height, weight, and activity level, you can use an online calculator such as:  MobileTransition.ch To calculate how much weight you should gain during pregnancy, you can use an online pregnancy weight gain calculator such as:  StreamingFood.com.cy Summary  While you are pregnant, your body requires additional nutrition to help support your growing baby.  Eat a variety of foods, especially fruits and vegetables to get a full range of vitamins and minerals.  Practice good food safety and cleanliness. Wash your hands before you eat and after you prepare raw meat. Wash all fruits and vegetables well before peeling or eating. Taking these actions can help to prevent food-borne illnesses, such as listeriosis, that can be very dangerous to your baby.  Do not eat raw meat or fish. Do not eat fish that have high mercury content, such as tilefish, shark, swordfish, and king mackerel. Do not eat unpasteurized (raw) dairy.  Take a prenatal vitamin to help meet your additional vitamin and mineral needs during pregnancy, specifically for folic acid, iron, calcium, and vitamin D. This information is not intended to replace advice given to you by your health care provider. Make sure you discuss any questions you have with your health care provider. Document Revised: 05/18/2018 Document Reviewed: 09/24/2016 Elsevier Patient Education  2020 [employer]. Prenatal Care Prenatal care is health care during pregnancy. It helps you and your unborn baby (fetus) stay as healthy as possible. Prenatal care may be provided by a midwife, a family practice health care provider, or a childbirth and pregnancy specialist (obstetrician).  How does this affect me? During pregnancy, you will be closely monitored for any new conditions that might develop. To lower your risk of pregnancy complications, you and your health care provider will talk about any underlying conditions you have. How does this affect my baby? Early and consistent prenatal care increases the chance that your baby will be healthy during pregnancy. Prenatal care lowers the risk that your baby will be:  Born early (prematurely).  Smaller than expected at birth (small for gestational age). What can I expect at the first prenatal care visit? Your first prenatal care visit will likely be the longest. You should schedule your first prenatal care visit as soon as you know that you are pregnant. Your first visit is a good time to talk about any questions or concerns you have about pregnancy. At your visit, you and your health care provider will talk about:  Your medical history, including: ? Any past pregnancies. ? Your family's medical history. ? The baby's father's medical history. ? Any long-term (chronic) health conditions you have and how you manage them. ? Any surgeries or procedures you have had. ? Any current over-the-counter or prescription medicines, herbs, or supplements you are taking.  Other factors that could pose a risk to your baby, including:  Your home setting and your stress levels, including: ? Exposure to abuse or violence. ? Household financial strain. ? Mental health conditions you have.  Your daily health habits, including diet and exercise. Your health care provider will also:  Measure your weight, height, and blood pressure.  Do a physical exam, including a  pelvic and breast exam.  Perform blood tests and urine tests to check for: ? Urinary tract infection. ? Sexually transmitted infections (STIs). ? Low iron levels in your blood (anemia). ? Blood type and certain proteins on red blood cells (Rh antibodies). ? Infections and immunity to viruses, such as hepatitis B and rubella. ? HIV (human immunodeficiency virus).  Do an ultrasound to confirm your baby's growth and development and to help predict your estimated due date (EDD). This ultrasound is done with a probe that is inserted into the vagina (transvaginal ultrasound).  Discuss your options for genetic screening.  Give you information about how to keep yourself and your baby healthy, including: ? Nutrition and taking vitamins. ? Physical activity. ? How to manage pregnancy symptoms such as nausea and vomiting (morning sickness). ? Infections and substances that may be harmful to your baby and how to avoid them. ? Food safety. ? Dental care. ? Working. ? Travel. ? Warning signs to watch for and when to call your health care provider. How often will I have prenatal care visits? After your first prenatal care visit, you will have regular visits throughout your pregnancy. The visit schedule is often as follows:  Up to week 28 of pregnancy: once every 4 weeks.  28-36 weeks: once every 2 weeks.  After 36 weeks: every week until delivery. Some women may have visits more or less often depending on any underlying health conditions and the health of the baby. Keep all follow-up and prenatal care visits as told by your health care provider. This is important. What happens during routine prenatal care visits? Your health care provider will:  Measure your weight and blood pressure.  Check for fetal heart sounds.  Measure the height of your uterus in your abdomen (fundal height). This may be measured starting around week 20 of pregnancy.  Check the position of your baby inside  your  uterus.  Ask questions about your diet, sleeping patterns, and whether you can feel the baby move.  Review warning signs to watch for and signs of labor.  Ask about any pregnancy symptoms you are having and how you are dealing with them. Symptoms may include: ? Headaches. ? Nausea and vomiting. ? Vaginal discharge. ? Swelling. ? Fatigue. ? Constipation. ? Any discomfort, including back or pelvic pain. Make a list of questions to ask your health care provider at your routine visits. What tests might I have during prenatal care visits? You may have blood, urine, and imaging tests throughout your pregnancy, such as:  Urine tests to check for glucose, protein, or signs of infection.  Glucose tests to check for a form of diabetes that can develop during pregnancy (gestational diabetes mellitus). This is usually done around week 24 of pregnancy.  An ultrasound to check your baby's growth and development and to check for birth defects. This is usually done around week 20 of pregnancy.  A test to check for group B strep (GBS) infection. This is usually done around week 36 of pregnancy.  Genetic testing. This may include blood or imaging tests, such as an ultrasound. Some genetic tests are done during the first trimester and some are done during the second trimester. What else can I expect during prenatal care visits? Your health care provider may recommend getting certain vaccines during pregnancy. These may include:  A yearly flu shot (annual influenza vaccine). This is especially important if you will be pregnant during flu season.  Tdap (tetanus, diphtheria, pertussis) vaccine. Getting this vaccine during pregnancy can protect your baby from whooping cough (pertussis) after birth. This vaccine may be recommended between weeks 27 and 36 of pregnancy. Later in your pregnancy, your health care provider may give you information about:  Childbirth and breastfeeding classes.  Choosing a  health care provider for your baby.  Umbilical cord banking.  Breastfeeding.  Birth control after your baby is born.  The hospital labor and delivery unit and how to tour it.  Registering at the hospital before you go into labor. Where to find more information  Office on Women's Health: TravelLesson.ca  American Pregnancy Association: americanpregnancy.org  March of Dimes: marchofdimes.org Summary  Prenatal care helps you and your baby stay as healthy as possible during pregnancy.  Your first prenatal care visit will most likely be the longest.  You will have visits and tests throughout your pregnancy to monitor your health and your baby's health.  Bring a list of questions to your visits to ask your health care provider.  Make sure to keep all follow-up and prenatal care visits with your health care provider. This information is not intended to replace advice given to you by your health care provider. Make sure you discuss any questions you have with your health care provider. Document Revised: 04/19/2018 Document Reviewed: 12/27/2016 Elsevier Patient Education  2020 [employer].

## 2019-05-09 NOTE — Progress Notes (Signed)
NOB 

## 2019-05-11 DIAGNOSIS — Z3481 Encounter for supervision of other normal pregnancy, first trimester: Secondary | ICD-10-CM | POA: Insufficient documentation

## 2019-05-11 DIAGNOSIS — O0990 Supervision of high risk pregnancy, unspecified, unspecified trimester: Secondary | ICD-10-CM | POA: Insufficient documentation

## 2019-05-11 LAB — URINE CULTURE

## 2019-05-11 NOTE — Progress Notes (Signed)
New Obstetric Patient H&P  Date of Service: 05/09/2019  Chief Complaint: "Desires prenatal care"   History of Present Illness: Patient is a 24 y.o. [id number] Not Hispanic or Latino female, presents with amenorrhea and positive home pregnancy test. Patient's last menstrual period was 03/11/2019 (within days). and based on her  LMP, her EDD is Estimated Date of Delivery: 12/16/19 and her EGA is [redacted]w[redacted]d. Cycles are 5. days, regular, and occur approximately every : 28 days. Her first PAP smear is today.   She had a urine pregnancy test which was positive 5 week(s)  ago. Her last menstrual period was normal and lasted for  5 day(s). Since her LMP she claims she has experienced breast tenderness, fatigue, nausea. She denies vaginal bleeding. Her past medical history is noncontributory. Her prior pregnancies are notable for 2017 female FT SVD 7#7oz, 2019 female 8#11oz FT SVD  Since her LMP, she admits to the use of tobacco products  no She claims she has lost  6 pounds since the start of her pregnancy.  There are cats in the home in the home  no  She admits close contact with children on a regular basis  yes  She has had chicken pox in the past no She has had Tuberculosis exposures, symptoms, or previously tested positive for TB   no Current or past history of domestic violence. no  Genetic Screening/Teratology Counseling: (Includes patient, baby's father, or anyone in either family with:)   50. Patient's age >/= 59 at [facility]  no 2. Thalassemia ([place], [place], [place], or Asian background): MCV<80  no 3. Neural tube defect (meningomyelocele, spina bifida, anencephaly)  no 4. Congenital heart defect  no  5. Down syndrome  no 6. Tay-Sachs (Jewish, [place])  no 7. Canavan's Disease  no 8. Sickle cell disease or trait (African)  no  9. Hemophilia or other blood disorders  no  10. Muscular dystrophy  no  11. Cystic fibrosis  no  12. Huntington's Chorea  no  13. Mental retardation/autism   no 14. Other inherited genetic or chromosomal disorder  no 15. Maternal metabolic disorder (DM, PKU, etc)  no 16. Patient or FOB with a child with a birth defect not listed above no  16a. Patient or FOB with a birth defect themselves no 17. Recurrent pregnancy loss, or stillbirth  no  18. Any medications since LMP other than prenatal vitamins (include vitamins, supplements, OTC meds, drugs, alcohol)  Denies any use 19. Any other genetic/environmental exposure to discuss  no  Infection History:   1. Lives with someone with TB or TB exposed  no  2. Patient or partner has history of genital herpes  no 3. Rash or viral illness since LMP  no 4. History of STI (GC, CT, HPV, syphilis, HIV)  Gonorrhea/Chlamydia infection February 2020: [facility]. 5. History of recent travel :  no  Other pertinent information:  She is uncertain if she will keep this pregnancy.    Review of Systems:10 point review of systems negative unless otherwise noted in HPI  Past Medical History:  Patient Active Problem List   Diagnosis Date Noted  . Supervision of normal intrauterine pregnancy in multigravida in first trimester 05/11/2019    [facility]inic [facility] Prenatal Labs  Dating  Blood type:     Genetic Screen 1 Screen:    AFP:     Quad:     NIPS: Antibody:   Anatomic [place]  Rubella:   Varicella: @VZVIGG @  GTT Third trimester:  RPR:     Rhogam  HBsAg:     Vaccines TDAP:                       Flu Shot: HIV:     Baby Food Formula                               GBS:   Contraception  Pap: 05/09/19  CBB     CS/VBAC NA   Support Person [name]       . Tobacco use during pregnancy, antepartum 03/07/2018  . Iron deficiency anemia 07/05/2017  . Rh negative state in antepartum period 05/09/2017    [ ]  rhogam 30 weeks   . Marijuana use 03/18/2017  . ADHD (attention deficit hyperactivity disorder) 11/20/2013    Overview:  Dr. 13/10/2013   . Allergic rhinitis 11/20/2013    Past  Surgical History:  Past Surgical History:  Procedure Laterality Date  . WISDOM TOOTH EXTRACTION     age 22 - all four    Gynecologic History: Patient's last menstrual period was 03/11/2019 (within days).  Obstetric History: 03/13/2019  Family History:  Family History  Problem Relation Age of Onset  . Hypertension Maternal Grandmother   . Breast cancer Other   . Cancer Other 74       lung    Social History:  Social History   Socioeconomic History  . Marital status: Single    Spouse name: Not on file  . Number of children: 1  . Years of education: 1  . Highest education level: Not on file  Occupational History  . Occupation: UNEMPLOYED  Tobacco Use  . Smoking status: Current Every Day Smoker    Packs/day: 1.00    Types: Cigarettes    Last attempt to quit: 04/28/2015    Years since quitting: 4.0  . Smokeless tobacco: Never Used  Substance and Sexual Activity  . Alcohol use: Not Currently  . Drug use: Yes    Types: Marijuana    Comment: last use yesterday  . Sexual activity: Yes    Birth control/protection: None  Other Topics Concern  . Not on file  Social History Narrative  . Not on file   Social Determinants of Health   Financial Resource Strain:   . Difficulty of Paying Living Expenses:   Food Insecurity:   . Worried About 04/30/2015 in the Last Year:   . Programme researcher, broadcasting/film/video in the Last Year:   Transportation Needs:   . Barista (Medical):   Freight forwarder Lack of Transportation (Non-Medical):   Physical Activity:   . Days of Exercise per Week:   . Minutes of Exercise per Session:   Stress:   . Feeling of Stress :   Social Connections:   . Frequency of Communication with Friends and Family:   . Frequency of Social Gatherings with Friends and Family:   . Attends Religious Services:   . Active Member of Clubs or Organizations:   . Attends [employer] Meetings:   Banker Marital Status:   Intimate Partner Violence:   . Fear of Current or  Ex-Partner:   . Emotionally Abused:   [employer] Physically Abused:   . Sexually Abused:     Allergies:  Allergies  Allergen Reactions  . Vicodin [Hydrocodone-Acetaminophen] Itching    Medications: Prior to Admission medications   Not on  File    Physical Exam Vitals: Blood pressure 100/69, pulse (!) 103, weight 119 lb (54 kg), last menstrual period 03/11/2019  General: NAD HEENT: normocephalic, anicteric Thyroid: no enlargement, no palpable nodules Pulmonary: No increased work of breathing, CTAB Cardiovascular: RRR, distal pulses 2+ Abdomen: NABS, soft, non-tender, non-distended.  Umbilicus without lesions.  No hepatomegaly, splenomegaly or masses palpable. No evidence of hernia  Genitourinary:  External: Normal external female genitalia.  Normal urethral meatus, normal  Bartholin's and Skene's glands.    Vagina: Normal vaginal mucosa, no evidence of prolapse.    Cervix: Grossly normal in appearance, no bleeding, no CMT  Uterus:  Non-enlarged, mobile, normal contour.    Adnexa: ovaries non-enlarged, no adnexal masses  Rectal: deferred Extremities: no edema, erythema, or tenderness Neurologic: Grossly intact Psychiatric: mood appropriate, affect full   Assessment: 24 y.o. [id number] at [redacted]w[redacted]d by approximate LMP presenting to initiate prenatal care  Plan: 1) Avoid alcoholic beverages. 2) Patient encouraged not to smoke.  3) Discontinue the use of all non-medicinal drugs and chemicals.  4) Take prenatal vitamins daily.  5) Nutrition, food safety (fish, cheese advisories, and high nitrite foods) and exercise discussed. 6) Hospital and practice style discussed with cross coverage system.  7) Genetic Screening, such as with 1st Trimester Screening, cell free fetal DNA, AFP testing, and Ultrasound, as well as with amniocentesis and CVS as appropriate, is discussed with patient. At the conclusion of today's visit patient declined genetic testing 8) Patient is asked about travel to areas at  risk for the [place] virus, and counseled to avoid travel and exposure to mosquitoes or sexual partners who may have themselves been exposed to the virus. Testing is discussed, and will be ordered as appropriate.  9) PAPtima, urine culture today 10) Return to clinic in 1 week for dating scan, UDS and ROB 11) NOB panel (future ordered) when pregnancy medicaid activated   Tresea Mall, CNM [facility] 05/11/2019, 9:58 AM

## 2019-05-14 LAB — CYTOLOGY - PAP
Chlamydia: NEGATIVE
Comment: NEGATIVE
Comment: NEGATIVE
Comment: NORMAL
Diagnosis: NEGATIVE
Diagnosis: REACTIVE
Neisseria Gonorrhea: NEGATIVE
Trichomonas: NEGATIVE

## 2019-05-17 ENCOUNTER — Other Ambulatory Visit: Payer: Self-pay | Admitting: Advanced Practice Midwife

## 2019-05-17 ENCOUNTER — Ambulatory Visit (INDEPENDENT_AMBULATORY_CARE_PROVIDER_SITE_OTHER): Payer: Medicaid Other | Admitting: Advanced Practice Midwife

## 2019-05-17 ENCOUNTER — Other Ambulatory Visit: Payer: Self-pay

## 2019-05-17 ENCOUNTER — Ambulatory Visit (INDEPENDENT_AMBULATORY_CARE_PROVIDER_SITE_OTHER): Payer: Medicaid Other

## 2019-05-17 ENCOUNTER — Encounter: Payer: Self-pay | Admitting: Advanced Practice Midwife

## 2019-05-17 VITALS — BP 98/60 | Wt 120.0 lb

## 2019-05-17 DIAGNOSIS — Z3481 Encounter for supervision of other normal pregnancy, first trimester: Secondary | ICD-10-CM

## 2019-05-17 DIAGNOSIS — Z3A11 11 weeks gestation of pregnancy: Secondary | ICD-10-CM

## 2019-05-17 DIAGNOSIS — Z113 Encounter for screening for infections with a predominantly sexual mode of transmission: Secondary | ICD-10-CM

## 2019-05-17 LAB — POCT URINALYSIS DIPSTICK OB: Glucose, UA: NEGATIVE

## 2019-05-17 NOTE — Patient Instructions (Signed)

## 2019-05-17 NOTE — Progress Notes (Signed)
  Routine Prenatal Care Visit  Subjective  Jennifer [name] is a 24 y.o. [phone] at [redacted]w[redacted]d being seen today for ongoing prenatal care.  She is currently monitored for the following issues for this low-risk pregnancy and has ADHD (attention deficit hyperactivity disorder); Allergic rhinitis; Marijuana use; Rh negative state in antepartum period; Iron deficiency anemia; Tobacco use during pregnancy, antepartum; and Supervision of normal intrauterine pregnancy in multigravida in first trimester on their problem list.  ----------------------------------------------------------------------------------- Patient reports no complaints.  We discussed results of dating scan and adjustment of EDD.  . Vag. Bleeding: None.   . Leaking Fluid denies.  ----------------------------------------------------------------------------------- The following portions of the patient's history were reviewed and updated as appropriate: allergies, current medications, past family history, past medical history, past social history, past surgical history and problem list. Problem list updated.  Objective  Blood pressure 98/60, weight 120 lb (54.4 kg), last menstrual period 03/11/2019 Pregravid weight 125 lb (56.7 kg) Total Weight Gain -5 lb (-2.268 kg) Urinalysis: Urine Protein Trace  Urine Glucose Negative  Fetal Status: Fetal Heart Rate (bpm): 159          Dating: 11 weeks 6 days with EDD of 11/30/19, EDD adjusted for 2 week difference  General:  Alert, oriented and cooperative. Patient is in no acute distress.  Skin: Skin is warm and dry. No rash noted.   Cardiovascular: Normal heart rate noted  Respiratory: Normal respiratory effort, no problems with respiration noted  Abdomen: Soft, gravid, appropriate for gestational age.       Pelvic:  Cervical exam deferred        Extremities: Normal range of motion.     Mental Status: Normal mood and affect. Normal behavior. Normal judgment and thought content.   Assessment    24 y.o. [id number] at [redacted]w[redacted]d by  11/30/2019, by Ultrasound presenting for routine prenatal visit  Plan    Preterm labor symptoms and general obstetric precautions including but not limited to vaginal bleeding, contractions, leaking of fluid and fetal movement were reviewed in detail with the patient. Please refer to After Visit Summary for other counseling recommendations.   Return in about 4 weeks (around 06/14/2019) for rob.  Tresea Mall, CNM 05/17/2019 3:14 PM

## 2019-05-18 LAB — RPR+RH+ABO+RUB AB+AB SCR+CB...
Antibody Screen: NEGATIVE
HIV Screen 4th Generation wRfx: NONREACTIVE
Hematocrit: 33.4 % — ABNORMAL LOW (ref 34.0–46.6)
Hemoglobin: 11.6 g/dL (ref 11.1–15.9)
Hepatitis B Surface Ag: NEGATIVE
MCH: 30.8 pg (ref 26.6–33.0)
MCHC: 34.7 g/dL (ref 31.5–35.7)
MCV: 89 fL (ref 79–97)
Platelets: 216 10*3/uL (ref 150–450)
RBC: 3.77 x10E6/uL (ref 3.77–5.28)
RDW: 11.8 % (ref 11.7–15.4)
RPR Ser Ql: NONREACTIVE
Rh Factor: NEGATIVE
Rubella Antibodies, IGG: 3.1 index (ref >0.99)
Varicella zoster IgG: 562 index (ref >165)
WBC: 9.4 10*3/uL (ref 3.4–10.8)

## 2019-05-23 LAB — URINE DRUG PANEL 7
Amphetamines, Urine: NEGATIVE ng/mL
Barbiturate Quant, Ur: NEGATIVE ng/mL
Benzodiazepine Quant, Ur: NEGATIVE ng/mL
Cannabinoid Quant, Ur: POSITIVE — AB
Cocaine (Metab.): NEGATIVE ng/mL
Opiate Quant, Ur: NEGATIVE ng/mL
PCP Quant, Ur: NEGATIVE ng/mL

## 2019-06-12 ENCOUNTER — Encounter: Payer: Self-pay | Admitting: Obstetrics & Gynecology

## 2019-06-12 ENCOUNTER — Ambulatory Visit (INDEPENDENT_AMBULATORY_CARE_PROVIDER_SITE_OTHER): Payer: Medicaid Other | Admitting: Obstetrics & Gynecology

## 2019-06-12 ENCOUNTER — Other Ambulatory Visit: Payer: Self-pay

## 2019-06-12 VITALS — BP 100/60 | Wt 122.0 lb

## 2019-06-12 DIAGNOSIS — Z3689 Encounter for other specified antenatal screening: Secondary | ICD-10-CM

## 2019-06-12 DIAGNOSIS — Z3A15 15 weeks gestation of pregnancy: Secondary | ICD-10-CM

## 2019-06-12 DIAGNOSIS — Z3482 Encounter for supervision of other normal pregnancy, second trimester: Secondary | ICD-10-CM

## 2019-06-12 MED ORDER — DOXYLAMINE-PYRIDOXINE 10-10 MG PO TBEC: 2.0000 | DELAYED_RELEASE_TABLET | Freq: Every day | ORAL | 5 refills | Status: DC

## 2019-06-12 MED ORDER — ONDANSETRON 4 MG PO TBDP: 4.0000 mg | ORAL_TABLET | Freq: Four times a day (QID) | ORAL | 0 refills | Status: DC | PRN

## 2019-06-12 MED ORDER — SERTRALINE HCL 50 MG PO TABS: 50.0000 mg | ORAL_TABLET | Freq: Every day | ORAL | 5 refills | Status: DC

## 2019-06-12 NOTE — Patient Instructions (Signed)

## 2019-06-12 NOTE — Progress Notes (Signed)
  Subjective  Fetal Movement? yes Contractions? no Leaking Fluid? no Vaginal Bleeding? no Pt has nausea, esp trying to stop MJ use.    Also more anxiety  Objective  BP 100/60   Wt 122 lb (55.3 kg)   LMP 03/11/2019 (Within Weeks)   BMI 22.31 kg/m  General: NAD Pumonary: no increased work of breathing Abdomen: gravid, non-tender Extremities: no edema Psychiatric: mood appropriate, affect full  Assessment  24 y.o. [id number] at [redacted]w[redacted]d by  11/30/2019, by Ultrasound presenting for routine prenatal visit  Plan   Problem List Items Addressed This Visit      Other   Supervision of normal intrauterine pregnancy in multigravida in first trimester    Other Visit Diagnoses    [redacted] weeks gestation of pregnancy    -  Primary   Screening, antenatal, for fetal anatomic survey       Relevant Orders   US OB Comp + 14 Wk    PNV Zoloft for mood stabilizer as she has h/o anxiety and also feels more anxious while trying to stop MJ use Diclegis and Zofran for nausea [place] nv  Annamarie Major, MD, [facility], [facility] 06/12/2019  2:34 PM

## 2019-06-14 ENCOUNTER — Encounter: Payer: Medicaid Other | Admitting: Obstetrics & Gynecology

## 2019-06-27 ENCOUNTER — Telehealth: Payer: Self-pay

## 2019-06-27 NOTE — Telephone Encounter (Signed)
Pt calling; 20w; has a weird phlegmy light green tinged d/c; no itching, burning or irritation; x2 d; is concerned.  [phone]  Adv pt to be seen.  Pt states she is in [place].  Adv to go to an Urgent Care and be seen and have records sent to [place].  Pt voices understanding.

## 2019-07-12 ENCOUNTER — Encounter: Payer: Self-pay | Admitting: Obstetrics & Gynecology

## 2019-07-12 ENCOUNTER — Ambulatory Visit (INDEPENDENT_AMBULATORY_CARE_PROVIDER_SITE_OTHER): Payer: Medicaid Other

## 2019-07-12 ENCOUNTER — Other Ambulatory Visit: Payer: Self-pay

## 2019-07-12 ENCOUNTER — Ambulatory Visit (INDEPENDENT_AMBULATORY_CARE_PROVIDER_SITE_OTHER): Payer: Medicaid Other | Admitting: Obstetrics & Gynecology

## 2019-07-12 ENCOUNTER — Other Ambulatory Visit (HOSPITAL_COMMUNITY)
Admission: RE | Admit: 2019-07-12 | Discharge: 2019-07-12 | Disposition: A | Discharge status: Home / Self Care | Payer: Medicaid Other | Source: Ambulatory Visit | Attending: Obstetrics & Gynecology | Admitting: Obstetrics & Gynecology

## 2019-07-12 VITALS — BP 100/60 | Wt 122.0 lb

## 2019-07-12 DIAGNOSIS — Z113 Encounter for screening for infections with a predominantly sexual mode of transmission: Secondary | ICD-10-CM | POA: Diagnosis not present

## 2019-07-12 DIAGNOSIS — Z131 Encounter for screening for diabetes mellitus: Secondary | ICD-10-CM

## 2019-07-12 DIAGNOSIS — Z3482 Encounter for supervision of other normal pregnancy, second trimester: Secondary | ICD-10-CM

## 2019-07-12 DIAGNOSIS — Z3A19 19 weeks gestation of pregnancy: Secondary | ICD-10-CM

## 2019-07-12 DIAGNOSIS — Z3689 Encounter for other specified antenatal screening: Secondary | ICD-10-CM

## 2019-07-12 DIAGNOSIS — Z3481 Encounter for supervision of other normal pregnancy, first trimester: Secondary | ICD-10-CM

## 2019-07-12 MED ORDER — BUTALBITAL-APAP-CAFFEINE 50-325-40 MG PO TABS: 1.0000 | ORAL_TABLET | ORAL | 2 refills | Status: DC | PRN

## 2019-07-12 NOTE — Patient Instructions (Addendum)
Prenatal Ultrasound A prenatal ultrasound exam, also called a sonogram, is an imaging test that allows your health care provider to see your baby and placenta in the uterus. This is a safe and painless test that does not expose you or your baby to any X-rays, needles, or medicines. Prenatal ultrasounds are done using a handheld plastic device (transducer) that sends out sound waves (ultrasound). The sound waves reflect off your baby's bones and other tissues to create moving images on a computer screen. There are two types of prenatal ultrasound:  Transabdominal ultrasound. During this test, a transducer is placed on your belly and moved around. A routine transabdominal ultrasound is usually done between weeks 18 and 22 of pregnancy (standard ultrasound). It may also be done between weeks 13 and 14.  Transvaginal ultrasound. During this test, a transducer that is shaped like a wand is placed inside your vagina. This type of ultrasound is usually done during early pregnancy. Prenatal ultrasounds may be used to check:  How far along your pregnancy is (stage).  Your baby's development (gestational age).  The location and condition of the organ that supplies your baby with nourishment and oxygen (placenta).  Your baby's heart rate, position, and movements.  Your baby's approximate size and weight.  The amount of fluid surrounding your baby (amniotic fluid).  If you are carrying more than one baby.  Your baby's sex (if your baby is in a position that allows the sex organs to be seen, and if you choose to learn the sex at this time).  If there are any possible problems that require more testing, such as genetic problems.  If your pregnancy is forming outside your uterus (ectopic pregnancy). You may have other ultrasounds as needed at any point during your pregnancy. If your health care provider suspects a problem, you may also have a more detailed type of transabdominal ultrasound (advanced  ultrasound). What are the risks? Generally, this is a safe test. There are no known risks for you or your baby from a prenatal ultrasound. What happens before the test?  Before a transabdominal ultrasound, you may be asked to drink fluid 2 hours before the exam and avoid emptying your bladder. A full bladder helps the images show up more clearly.  Before a transvaginal ultrasound, you may be asked to empty your bladder before the exam.  Wear loose, comfortable clothing so it is easy to undress or expose your lower belly for the exam. What happens during the test? If you are having a transabdominal ultrasound:  You will lie on an exam table.  Your belly will be exposed.  Gel will be rubbed over your belly.  The transducer will be pressed on your belly and moved back and forth, through the gel. You may feel slight pressure, but there should not be any pain.  You may be asked to change your position.  You may hear sounds of blood flow and your baby's heartbeat. You may be able to see images of your baby on the computer screen. Your health care provider may measure your baby's head and other body parts, looking for normal development.  After the exam, the gel will be cleaned off, and you can replace your clothing. You will be able to empty your bladder after the exam is done. If you are having a transvaginal ultrasound:  You will change into a hospital gown or undress from the waist down and cover yourself with a paper sheet.  You will lie down on   an exam table with your feet in footrests (stirrups).  The transducer will be covered with a protective cover and lubricated.  The transducer will be inserted into your vagina.  You may hear sounds of blood flow and your baby's heartbeat. You may be able to see images of your baby on the computer screen.  After the exam, the transducer will be removed, and you can put your clothes back on. What can I expect after the test?  You can  drive yourself home and return to all your normal activities.  A health care provider trained in interpreting ultrasounds will review the images taken during your exam and send a report to your health care provider.  It is up to you to get your test results. Ask your health care provider, or the department that is doing the test, when your results will be ready. Questions to ask your health care provider  Why am I having this prenatal ultrasound?  What information will this exam provide?  How much does this exam cost? What costs will my insurance cover?  Can my partner or support person be with me during the exam?  When can I expect to get the results? Summary  A prenatal ultrasound is a safe and painless imaging exam that gives information about your pregnancy and your developing baby.  Transvaginal ultrasound exams are often done in early pregnancy. Standard transabdominal ultrasounds are typically done between 18 and 22 weeks of pregnancy. You may have other prenatal ultrasounds as needed.  This exam has no risks for you or your baby. After the exam, you can go home and return to all your usual activities. This information is not intended to replace advice given to you by your health care provider. Make sure you discuss any questions you have with your health care provider. Document Revised: 04/21/2018 Document Reviewed: 03/02/2017 Elsevier Patient Education  2020 Elsevier Inc.  

## 2019-07-12 NOTE — Progress Notes (Signed)
  Subjective  Fetal Movement? yes Contractions? no Leaking Fluid? no Vaginal Bleeding? no Concerned about STD exposure risk (infidelity- BF) Tongue ulcerations at times  Objective  BP 100/60   Wt 122 lb (55.3 kg)   LMP 03/11/2019 (Within Weeks)   BMI 22.31 kg/m  General: NAD Pumonary: no increased work of breathing Abdomen: gravid, non-tender Extremities: no edema Psychiatric: mood appropriate, affect full  Assessment  24 y.o. [id number] at [redacted]w[redacted]d by  11/30/2019, by Ultrasound presenting for routine prenatal visit  Plan   Problem List Items Addressed This Visit      Other   Supervision of normal intrauterine pregnancy in multigravida in first trimester    Other Visit Diagnoses    [redacted] weeks gestation of pregnancy    -  Primary   Supervision of normal intrauterine pregnancy in multigravida in second trimester       Screening for diabetes mellitus       Relevant Orders   28 Weeks RH-Panel   Screen for STD (sexually transmitted disease)       Relevant Orders   Cervicovaginal ancillary only   HSV(herpes smplx)abs-1+2(IgG+IgM)-bld    PNV  Annamarie Major, MD, [facility], [facility] 07/12/2019  3:32 PM

## 2019-07-14 LAB — HSV(HERPES SMPLX)ABS-I+II(IGG+IGM)-BLD
HSV 1 Glycoprotein G Ab, IgG: <0.91 index (ref 0.00–0.90)
HSV 2 IgG, Type Spec: <0.91 index (ref 0.00–0.90)
HSVI/II Comb IgM: <0.91 Ratio (ref 0.00–0.90)

## 2019-07-17 LAB — CERVICOVAGINAL ANCILLARY ONLY
Chlamydia: NEGATIVE
Comment: NEGATIVE
Comment: NEGATIVE
Comment: NORMAL
Neisseria Gonorrhea: NEGATIVE
Trichomonas: NEGATIVE

## 2019-07-17 NOTE — Progress Notes (Signed)
Pt "not able to receive calls at this time"

## 2019-07-17 NOTE — Progress Notes (Signed)
Let her know labs for HSV negative as expected

## 2019-07-19 ENCOUNTER — Telehealth: Payer: Self-pay

## 2019-07-19 NOTE — Telephone Encounter (Signed)
Pt calling for results from a week ago; hasn't heard anything.  [phone]  Pt aware of negative results.

## 2019-08-06 ENCOUNTER — Telehealth: Payer: Self-pay

## 2019-08-06 NOTE — Telephone Encounter (Signed)
Pt called after hour nurse 08/05/19 7:30pm; 23 wks; having some spotting; wiped and the blood was there; no pain but has pressure; did have sex. Last night.  [phone]  Pt states she has had no bleeding today.  Adv probably from having sex.

## 2019-08-08 ENCOUNTER — Other Ambulatory Visit: Payer: Self-pay

## 2019-08-08 ENCOUNTER — Ambulatory Visit (INDEPENDENT_AMBULATORY_CARE_PROVIDER_SITE_OTHER): Payer: Medicaid Other | Admitting: Obstetrics & Gynecology

## 2019-08-08 ENCOUNTER — Encounter: Payer: Self-pay | Admitting: Obstetrics & Gynecology

## 2019-08-08 VITALS — BP 120/70 | Wt 132.0 lb

## 2019-08-08 DIAGNOSIS — Z3A23 23 weeks gestation of pregnancy: Secondary | ICD-10-CM

## 2019-08-08 DIAGNOSIS — Z3482 Encounter for supervision of other normal pregnancy, second trimester: Secondary | ICD-10-CM

## 2019-08-08 LAB — POCT URINALYSIS DIPSTICK OB
Glucose, UA: NEGATIVE
POC,PROTEIN,UA: NEGATIVE

## 2019-08-08 MED ORDER — NYSTATIN 100000 UNIT/ML MT SUSP: 5.0000 mL | Freq: Four times a day (QID) | OROMUCOSAL | 0 refills | Status: DC

## 2019-08-08 NOTE — Addendum Note (Signed)
Addended by: Cornelius Moras D on: 08/08/2019 03:14 PM   Modules accepted: Orders

## 2019-08-08 NOTE — Patient Instructions (Signed)
Glucose Tolerance Test During Pregnancy Why am I having this test? The glucose tolerance test (GTT) is done to check how your body processes sugar (glucose). This is one of several tests used to diagnose diabetes that develops during pregnancy (gestational diabetes mellitus). Gestational diabetes is a temporary form of diabetes that some women develop during pregnancy. It usually occurs during the second trimester of pregnancy and goes away after delivery. Testing (screening) for gestational diabetes usually occurs between 24 and 28 weeks of pregnancy. You may have the GTT test after having a 1-hour glucose screening test if the results from that test indicate that you may have gestational diabetes. You may also have this test if:  You have a history of gestational diabetes.  You have a history of giving birth to very large babies or have experienced repeated fetal loss (stillbirth).  You have signs and symptoms of diabetes, such as: ? Changes in your vision. ? Tingling or numbness in your hands or feet. ? Changes in hunger, thirst, and urination that are not otherwise explained by your pregnancy. What is being tested? This test measures the amount of glucose in your blood at different times during a period of 3 hours. This indicates how well your body is able to process glucose. What kind of sample is taken?  Blood samples are required for this test. They are usually collected by inserting a needle into a blood vessel. How do I prepare for this test?  For 3 days before your test, eat normally. Have plenty of carbohydrate-rich foods.  Follow instructions from your health care provider about: ? Eating or drinking restrictions on the day of the test. You may be asked to not eat or drink anything other than water (fast) starting 8-10 hours before the test. ? Changing or stopping your regular medicines. Some medicines may interfere with this test. Tell a health care provider about:  All  medicines you are taking, including vitamins, herbs, eye drops, creams, and over-the-counter medicines.  Any blood disorders you have.  Any surgeries you have had.  Any medical conditions you have. What happens during the test? First, your blood glucose will be measured. This is referred to as your fasting blood glucose, since you fasted before the test. Then, you will drink a glucose solution that contains a certain amount of glucose. Your blood glucose will be measured again 1, 2, and 3 hours after drinking the solution. This test takes about 3 hours to complete. You will need to stay at the testing location during this time. During the testing period:  Do not eat or drink anything other than the glucose solution.  Do not exercise.  Do not use any products that contain nicotine or tobacco, such as cigarettes and e-cigarettes. If you need help stopping, ask your health care provider. The testing procedure may vary among health care providers and hospitals. How are the results reported? Your results will be reported as milligrams of glucose per deciliter of blood (mg/dL) or millimoles per liter (mmol/L). Your health care provider will compare your results to normal ranges that were established after testing a large group of people (reference ranges). Reference ranges may vary among labs and hospitals. For this test, common reference ranges are:  Fasting: less than 95-105 mg/dL (5.3-5.8 mmol/L).  1 hour after drinking glucose: less than 180-190 mg/dL (10.0-10.5 mmol/L).  2 hours after drinking glucose: less than 155-165 mg/dL (8.6-9.2 mmol/L).  3 hours after drinking glucose: 140-145 mg/dL (7.8-8.1 mmol/L). What do the   results mean? Results within reference ranges are considered normal, meaning that your glucose levels are well-controlled. If two or more of your blood glucose levels are high, you may be diagnosed with gestational diabetes. If only one level is high, your health care  provider may suggest repeat testing or other tests to confirm a diagnosis. Talk with your health care provider about what your results mean. Questions to ask your health care provider Ask your health care provider, or the department that is doing the test:  When will my results be ready?  How will I get my results?  What are my treatment options?  What other tests do I need?  What are my next steps? Summary  The glucose tolerance test (GTT) is one of several tests used to diagnose diabetes that develops during pregnancy (gestational diabetes mellitus). Gestational diabetes is a temporary form of diabetes that some women develop during pregnancy.  You may have the GTT test after having a 1-hour glucose screening test if the results from that test indicate that you may have gestational diabetes. You may also have this test if you have any symptoms or risk factors for gestational diabetes.  Talk with your health care provider about what your results mean. This information is not intended to replace advice given to you by your health care provider. Make sure you discuss any questions you have with your health care provider. Document Revised: 04/20/2018 Document Reviewed: 08/09/2016 Elsevier Patient Education  2020 [employer] Inc[employer]  

## 2019-08-08 NOTE — Progress Notes (Signed)
  Subjective  Fetal Movement? yes Contractions? no Leaking Fluid? no Vaginal Bleeding? no Tongue ulcers and discomfort Objective  BP 120/70   Wt 132 lb (59.9 kg)   LMP 03/11/2019 (Within Weeks)   BMI 24.14 kg/m  General: NAD Pumonary: no increased work of breathing Abdomen: gravid, non-tender Extremities: no edema Psychiatric: mood appropriate, affect full  Assessment  24 y.o. [id number] at [redacted]w[redacted]d by  11/30/2019, by Ultrasound presenting for routine prenatal visit  Plan   Problem List Items Addressed This Visit    None    Visit Diagnoses    [redacted] weeks gestation of pregnancy    -  Primary   Supervision of normal intrauterine pregnancy in multigravida in second trimester        Nystatin swish and spit for tongue, possible thrush PNV Glucola nv Rhogam nv  Jennifer Major, MD, [facility], [facility] 08/08/2019  3:08 PM

## 2019-09-06 ENCOUNTER — Other Ambulatory Visit: Payer: Medicaid Other

## 2019-09-06 ENCOUNTER — Other Ambulatory Visit: Payer: Self-pay

## 2019-09-06 ENCOUNTER — Encounter: Payer: Medicaid Other | Admitting: Obstetrics & Gynecology

## 2019-09-06 ENCOUNTER — Ambulatory Visit (INDEPENDENT_AMBULATORY_CARE_PROVIDER_SITE_OTHER): Payer: Medicaid Other

## 2019-09-06 DIAGNOSIS — Z6791 Unspecified blood type, Rh negative: Secondary | ICD-10-CM | POA: Diagnosis not present

## 2019-09-06 DIAGNOSIS — O26899 Other specified pregnancy related conditions, unspecified trimester: Secondary | ICD-10-CM | POA: Diagnosis not present

## 2019-09-06 DIAGNOSIS — Z131 Encounter for screening for diabetes mellitus: Secondary | ICD-10-CM

## 2019-09-06 MED ORDER — RHO D IMMUNE GLOBULIN 1500 UNIT/2ML IJ SOSY
300.0000 ug | PREFILLED_SYRINGE | Freq: Once | INTRAMUSCULAR | Status: AC
  Administered 2019-09-06: 300 ug via INTRAMUSCULAR

## 2019-09-06 NOTE — Progress Notes (Signed)
Patient presents today for Rhogam injection due to RH negative. Given IM RUOQ. Patient tolerated well.

## 2019-09-07 LAB — 28 WEEKS RH-PANEL
Antibody Screen: NEGATIVE
Basophils Absolute: 0 10*3/uL (ref 0.0–0.2)
Basos: 0 %
EOS (ABSOLUTE): 0.1 10*3/uL (ref 0.0–0.4)
Eos: 1 %
Gestational Diabetes Screen: 133 mg/dL (ref 65–139)
HIV Screen 4th Generation wRfx: NONREACTIVE
Hematocrit: 29.9 % — ABNORMAL LOW (ref 34.0–46.6)
Hemoglobin: 10.3 g/dL — ABNORMAL LOW (ref 11.1–15.9)
Immature Grans (Abs): 0.1 10*3/uL (ref 0.0–0.1)
Immature Granulocytes: 1 %
Lymphocytes Absolute: 1.6 10*3/uL (ref 0.7–3.1)
Lymphs: 13 %
MCH: 30.4 pg (ref 26.6–33.0)
MCHC: 34.4 g/dL (ref 31.5–35.7)
MCV: 88 fL (ref 79–97)
Monocytes Absolute: 0.7 10*3/uL (ref 0.1–0.9)
Monocytes: 6 %
Neutrophils Absolute: 9.5 10*3/uL — ABNORMAL HIGH (ref 1.4–7.0)
Neutrophils: 79 %
Platelets: 205 10*3/uL (ref 150–450)
RBC: 3.39 x10E6/uL — ABNORMAL LOW (ref 3.77–5.28)
RDW: 11 % — ABNORMAL LOW (ref 11.7–15.4)
RPR Ser Ql: NONREACTIVE
WBC: 12 10*3/uL — ABNORMAL HIGH (ref 3.4–10.8)

## 2019-09-10 ENCOUNTER — Ambulatory Visit (INDEPENDENT_AMBULATORY_CARE_PROVIDER_SITE_OTHER): Payer: Medicaid Other | Admitting: Obstetrics

## 2019-09-10 ENCOUNTER — Other Ambulatory Visit: Payer: Self-pay

## 2019-09-10 VITALS — BP 90/60 | Wt 140.0 lb

## 2019-09-10 DIAGNOSIS — Z3A28 28 weeks gestation of pregnancy: Secondary | ICD-10-CM

## 2019-09-10 DIAGNOSIS — Z3481 Encounter for supervision of other normal pregnancy, first trimester: Secondary | ICD-10-CM

## 2019-09-10 DIAGNOSIS — O26899 Other specified pregnancy related conditions, unspecified trimester: Secondary | ICD-10-CM

## 2019-09-10 DIAGNOSIS — Z6791 Unspecified blood type, Rh negative: Secondary | ICD-10-CM

## 2019-09-10 DIAGNOSIS — O26893 Other specified pregnancy related conditions, third trimester: Secondary | ICD-10-CM

## 2019-09-10 LAB — POCT URINALYSIS DIPSTICK OB: Glucose, UA: NEGATIVE

## 2019-09-10 NOTE — Progress Notes (Signed)
  Routine Prenatal Care Visit  Subjective  Jennifer [name] is a 24 y.o. [phone] at [redacted]w[redacted]d being seen today for ongoing prenatal care.  She is currently monitored for the following issues for this low-risk pregnancy and has ADHD (attention deficit hyperactivity disorder); Allergic rhinitis; Marijuana use; Rh negative state in antepartum period; Iron deficiency anemia; Tobacco use during pregnancy, antepartum; and Supervision of normal intrauterine pregnancy in multigravida in first trimester on their problem list.  ----------------------------------------------------------------------------------- Patient reports no complaints.    .  .   Jennifer [name] Fluid denies.  ----------------------------------------------------------------------------------- The following portions of the patient's history were reviewed and updated as appropriate: allergies, current medications, past family history, past medical history, past social history, past surgical history and problem list. Problem list updated.  Objective  Blood pressure 90/60, weight 140 lb (63.5 kg), last menstrual period 03/11/2019, unknown if currently breastfeeding. Pregravid weight 125 lb (56.7 kg) Total Weight Gain 15 lb (6.804 kg) Urinalysis: Urine Protein Trace  Urine Glucose Negative  Fetal Status:           General:  Alert, oriented and cooperative. Patient is in no acute distress.  Skin: Skin is warm and dry. No rash noted.   Cardiovascular: Normal heart rate noted  Respiratory: Normal respiratory effort, no problems with respiration noted  Abdomen: Soft, gravid, appropriate for gestational age.       Pelvic:  Cervical exam deferred        Extremities: Normal range of motion.     Mental Status: Normal mood and affect. Normal behavior. Normal judgment and thought content.   Assessment   24 y.o. [id number] at [redacted]w[redacted]d by  11/30/2019, by Ultrasound presenting for routine prenatal visit  Plan   Pregnancy#4 Problems (from 03/11/19 to present)     No problems associated with this episode.       Preterm labor symptoms and general obstetric precautions including but not limited to vaginal bleeding, contractions, leaking of fluid and fetal movement were reviewed in detail with the patient. Please refer to After Visit Summary for other counseling recommendations.   Return in about 2 weeks (around 09/24/2019) for return OB.  Jennifer [name], CNM  09/10/2019 4:50 PM

## 2019-09-10 NOTE — Progress Notes (Signed)
No concerns.rj 

## 2019-09-16 IMAGING — CT CT CERVICAL SPINE WITHOUT CONTRAST
4 of 11 series · 6 of 33 positions shown, 7 images · non-contrast
Comparison: None.

CLINICAL DATA: Ataxia and head trauma.

EXAM:
CT HEAD WITHOUT CONTRAST
CT MAXILLOFACIAL WITHOUT CONTRAST
CT CERVICAL SPINE WITHOUT CONTRAST
TECHNIQUE: Multidetector CT imaging of the head, cervical spine, and
maxillofacial structures were performed using the standard protocol
without intravenous contrast. Multiplanar CT image reconstructions
of the cervical spine and maxillofacial structures were also
generated.

[Series 7: coronal soft tissue · coronal · 0.27mm/px · 1 of 54 slices shown]
[im 27/54  bone]
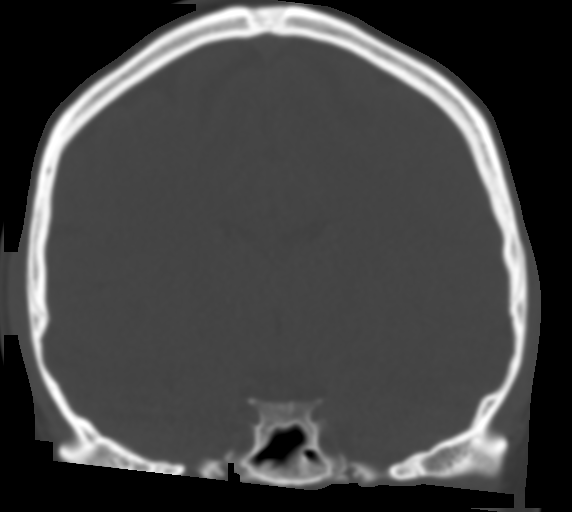

[Series 11: c spine soft · axial · 0.31mm/px · z∈[-166,-108]mm · 2 of 87 slices shown]
[im 29/87  soft-tissue]
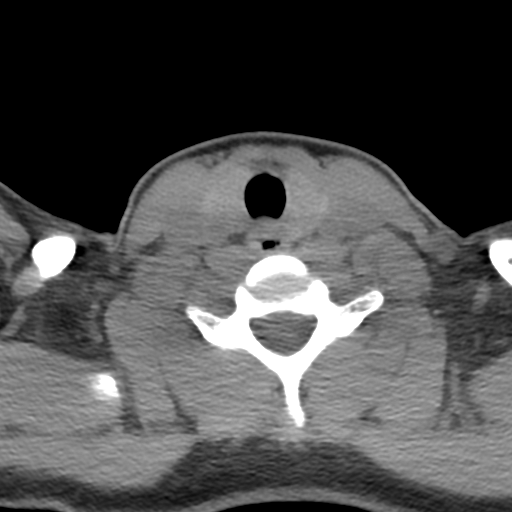
[im 58/87  soft-tissue]
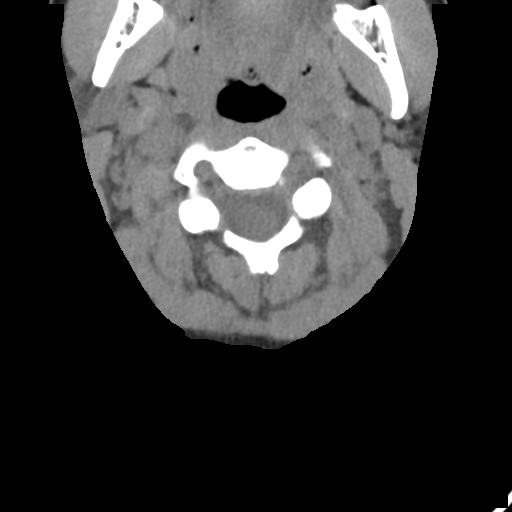

[Series 12: sagittal bone · sagittal · 0.23mm/px · 1 of 43 slices shown]
[im 22/43  bone]
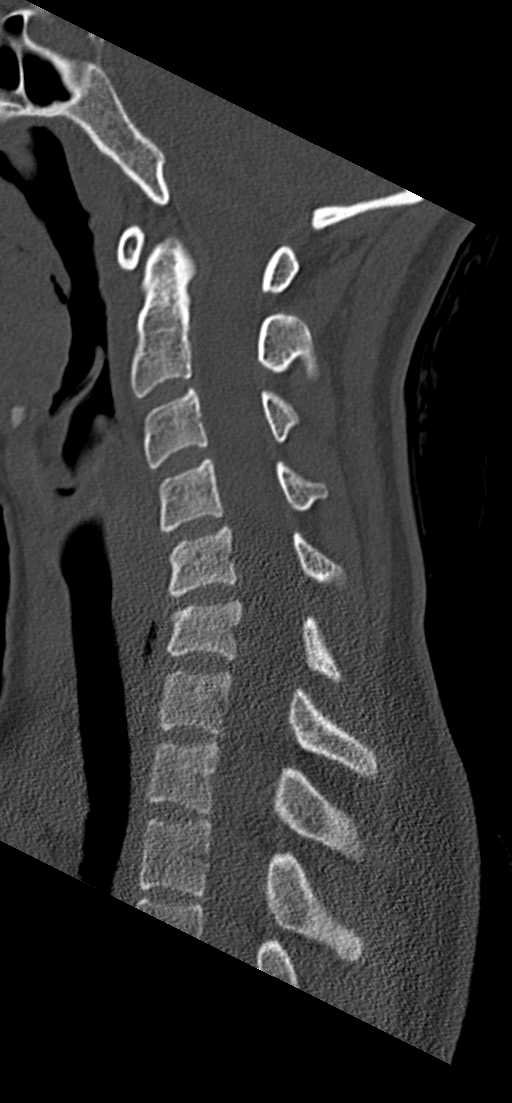

[Series 14: orthogonal axials · axial · 0.22mm/px · z∈[-187,-138]mm · 2 of 85 slices shown, 3 images]
[im 29/85  soft-tissue]
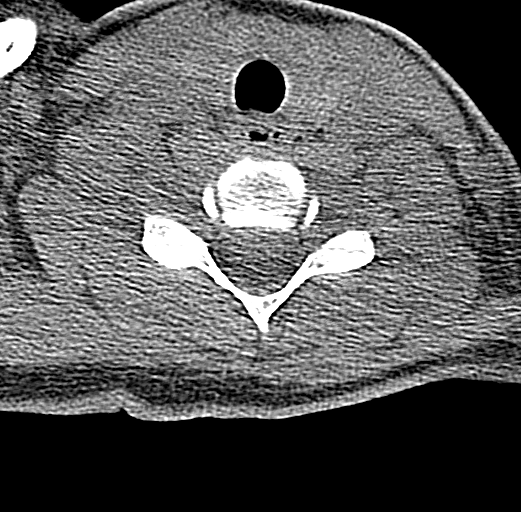
[im 29/85  bone]
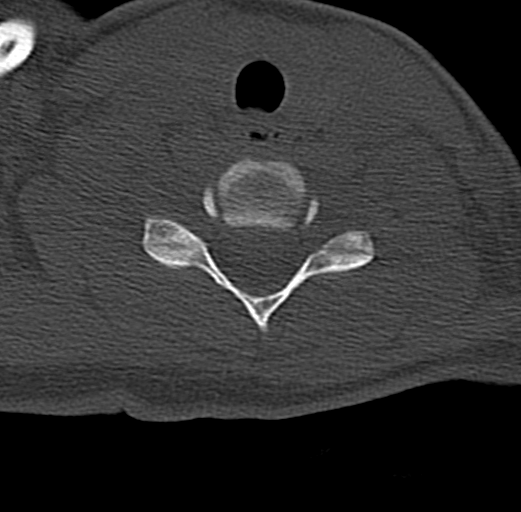
[im 57/85  bone]
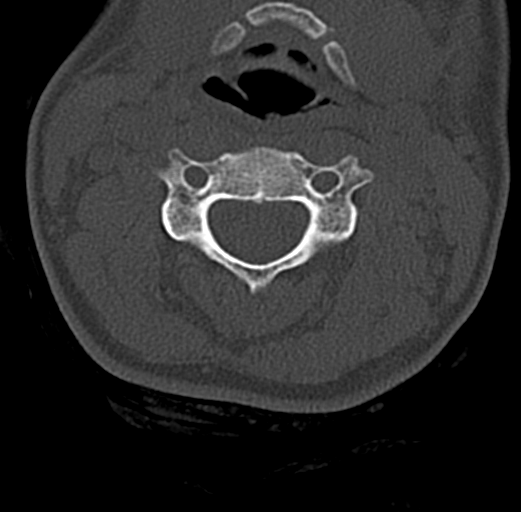

[6 of 33 positions shown; findings below may reference images not displayed]

FINDINGS: CT HEAD FINDINGS

Brain: There is no mass, hemorrhage or extra-axial collection. The
size and configuration of the ventricles and extra-axial CSF spaces
are normal. The brain parenchyma is normal, without evidence of
acute or chronic infarction.

Vascular: No abnormal hyperdensity of the major intracranial
arteries or dural venous sinuses. No intracranial atherosclerosis.

Skull: The visualized skull base, calvarium and extracranial soft
tissues are normal.

CT MAXILLOFACIAL FINDINGS

Osseous:

--Complex facial fracture types: No LeFort, zygomaticomaxillary
complex or nasoorbitoethmoidal fracture.

--Simple fracture types: None.

--Mandible: No fracture or dislocation.

Orbits: The globes are intact. Normal appearance of the intra- and
extraconal fat. Symmetric extraocular muscles and optic nerves.

Sinuses: No fluid levels or advanced mucosal thickening.

Soft tissues: Normal visualized extracranial soft tissues.

CT CERVICAL SPINE FINDINGS

Alignment: No static subluxation. Facets are aligned. Occipital
condyles and the lateral masses of C1-C2 are aligned.

Skull base and vertebrae: No acute fracture.

Soft tissues and spinal canal: No prevertebral fluid or swelling. No
visible canal hematoma.

Disc levels: No advanced spinal canal or neural foraminal stenosis.

Upper chest: No pneumothorax, pulmonary nodule or pleural effusion.

Other: Normal visualized paraspinal cervical soft tissues.
IMPRESSION: 1. Normal head CT.
2. No facial fracture.
3. No acute fracture or static subluxation of the cervical spine.

## 2019-09-18 ENCOUNTER — Other Ambulatory Visit: Payer: Self-pay | Admitting: Obstetrics & Gynecology

## 2019-09-20 ENCOUNTER — Other Ambulatory Visit: Payer: Self-pay | Admitting: Obstetrics & Gynecology

## 2019-09-20 ENCOUNTER — Telehealth: Payer: Self-pay | Admitting: Obstetrics & Gynecology

## 2019-09-20 MED ORDER — NYSTATIN 100000 UNIT/ML MT SUSP: 5.0000 mL | Freq: Four times a day (QID) | OROMUCOSAL | 0 refills | Status: DC

## 2019-09-20 NOTE — Telephone Encounter (Signed)
Patient is calling for a refill on her mouth wash. Please advise

## 2019-09-24 ENCOUNTER — Ambulatory Visit (INDEPENDENT_AMBULATORY_CARE_PROVIDER_SITE_OTHER): Payer: Medicaid Other | Admitting: Obstetrics and Gynecology

## 2019-09-24 ENCOUNTER — Encounter: Payer: Medicaid Other | Admitting: Obstetrics and Gynecology

## 2019-09-24 ENCOUNTER — Other Ambulatory Visit: Payer: Medicaid Other

## 2019-09-24 ENCOUNTER — Other Ambulatory Visit: Payer: Self-pay

## 2019-09-24 VITALS — BP 108/54 | Wt 145.0 lb

## 2019-09-24 DIAGNOSIS — Z3A30 30 weeks gestation of pregnancy: Secondary | ICD-10-CM

## 2019-09-24 DIAGNOSIS — Z3481 Encounter for supervision of other normal pregnancy, first trimester: Secondary | ICD-10-CM

## 2019-09-24 NOTE — Progress Notes (Signed)
Routine Prenatal Care Visit  Subjective  Jennifer [name] is a 24 y.o. [phone] at [redacted]w[redacted]d being seen today for ongoing prenatal care.  She is currently monitored for the following issues for this low-risk pregnancy and has ADHD (attention deficit hyperactivity disorder); Allergic rhinitis; Marijuana use; Rh negative state in antepartum period; Iron deficiency anemia; Tobacco use during pregnancy, antepartum; and Supervision of normal intrauterine pregnancy in multigravida in first trimester on their problem list.  ----------------------------------------------------------------------------------- Patient reports a vaginal bulge noted during bowl movement.  No pain, no bleeding.  Went away since. Contractions: Not present. Vag. Bleeding: None.  Movement: Present. Denies leaking of fluid.  ----------------------------------------------------------------------------------- The following portions of the patient's history were reviewed and updated as appropriate: allergies, current medications, past family history, past medical history, past social history, past surgical history and problem list. Problem list updated.   Objective  Blood pressure (!) 108/54, weight 145 lb (65.8 kg), last menstrual period 03/11/2019, unknown if currently breastfeeding. Pregravid weight 125 lb (56.7 kg) Total Weight Gain 20 lb (9.072 kg) Urinalysis:      Fetal Status: Fetal Heart Rate (bpm): 150   Movement: Present     General:  Alert, oriented and cooperative. Patient is in no acute distress.  Skin: Skin is warm and dry. No rash noted.   Cardiovascular: Normal heart rate noted  Respiratory: Normal respiratory effort, no problems with respiration noted  Abdomen: Soft, gravid, appropriate for gestational age. Pain/Pressure: Absent     Pelvic:  Cervical exam deferred      Normal external female genitalia, no evidence of prolapse, no evidence of urethral diverticulum.  Rectal exam intact external anal sphincter to  rectocele.  Extremities: Normal range of motion.     ental Status: Normal mood and affect. Normal behavior. Normal judgment and thought content.     Assessment   24 y.o. [id number] at [redacted]w[redacted]d by  11/30/2019, by Ultrasound presenting for work-in prenatal visit  Plan   Pregnancy#4 Problems (from 03/11/19 to present)    Problem Noted Resolved   Supervision of normal intrauterine pregnancy in multigravida in first trimester 05/11/2019 by Jennifer [name], CNM No   Overview Addendum 09/24/2019  2:28 PM by Jennifer Austria, MD    [facility] Prenatal Labs  Dating EDD by 11w u/s Blood type: B/Negative/-- (05/06 1512)   Genetic Screen      NIPS:declines Antibody:Negative (05/06 1512)  Anatomic [place] [facility] nml Rubella: 3.10 (05/06 1512) Varicella: Immune  GTT Third trimester: 133 RPR: Non Reactive (05/06 1512)   Rhogam Given at 28 weeks. HBsAg: Negative (05/06 1512)   Vaccines TDAP:                       Flu Shot: HIV: Non Reactive (05/06 1512)   Baby Food Formula                               GBS:   Contraception  Pap: 05/09/19 negative  CBB  No   CS/VBAC NA   Support Person Jennifer [name]          Previous Version       Gestational age appropriate obstetric precautions including but not limited to vaginal bleeding, contractions, leaking of fluid and fetal movement were reviewed in detail with the patient.    Normal pelvic exam today.  The patient has a picture with her an appears to be posterior vaginal wall but no evidence  of rectocele on DRE.  Reassurance provided.  Return in about 1 week (around 10/01/2019) for 1-2 week ROB.  Jennifer Austria, MD, [facility], [facility] 09/24/2019, 2:29 PM

## 2019-09-24 NOTE — Progress Notes (Signed)
ROB Ball of flesh hanging out of vagina, bleeding

## 2019-09-25 ENCOUNTER — Encounter: Payer: Medicaid Other | Admitting: Obstetrics

## 2019-10-02 ENCOUNTER — Other Ambulatory Visit: Payer: Self-pay

## 2019-10-02 ENCOUNTER — Ambulatory Visit (INDEPENDENT_AMBULATORY_CARE_PROVIDER_SITE_OTHER): Payer: Medicaid Other | Admitting: Obstetrics

## 2019-10-02 VITALS — BP 80/50 | Wt 146.0 lb

## 2019-10-02 DIAGNOSIS — Z3481 Encounter for supervision of other normal pregnancy, first trimester: Secondary | ICD-10-CM

## 2019-10-02 DIAGNOSIS — Z3483 Encounter for supervision of other normal pregnancy, third trimester: Secondary | ICD-10-CM

## 2019-10-02 DIAGNOSIS — Z3A31 31 weeks gestation of pregnancy: Secondary | ICD-10-CM

## 2019-10-02 DIAGNOSIS — Z23 Encounter for immunization: Secondary | ICD-10-CM | POA: Diagnosis not present

## 2019-10-02 DIAGNOSIS — Z3480 Encounter for supervision of other normal pregnancy, unspecified trimester: Secondary | ICD-10-CM

## 2019-10-02 LAB — POCT URINALYSIS DIPSTICK OB: Glucose, UA: NEGATIVE

## 2019-10-02 NOTE — Progress Notes (Signed)
C/o is iron level okay or does she need to take supp. rj

## 2019-10-02 NOTE — Progress Notes (Signed)
  Routine Prenatal Care Visit  Subjective  Jennifer [name] is a 24 y.o. [phone] at [redacted]w[redacted]d being seen today for ongoing prenatal care.  She is currently monitored for the following issues for this low-risk pregnancy and has ADHD (attention deficit hyperactivity disorder); Allergic rhinitis; Marijuana use; Rh negative state in antepartum period; Iron deficiency anemia; Tobacco use during pregnancy, antepartum; and Supervision of normal intrauterine pregnancy in multigravida in first trimester on their problem list.  ----------------------------------------------------------------------------------- Patient reports no complaints.    .  .   [name] denies.  ----------------------------------------------------------------------------------- The following portions of the patient's history were reviewed and updated as appropriate: allergies, current medications, past family history, past medical history, past social history, past surgical history and problem list. Problem list updated.  Objective  Blood pressure (!) 80/50, weight 146 lb (66.2 kg), last menstrual period 03/11/2019, unknown if currently breastfeeding. Pregravid weight 125 lb (56.7 kg) Total Weight Gain 21 lb (9.526 kg) Urinalysis: Urine Protein    Urine Glucose    Fetal Status:           General:  Alert, oriented and cooperative. Patient is in no acute distress.  Skin: Skin is warm and dry. No rash noted.   Cardiovascular: Normal heart rate noted  Respiratory: Normal respiratory effort, no problems with respiration noted  Abdomen: Soft, gravid, appropriate for gestational age.       Pelvic:  Cervical exam deferred        Extremities: Normal range of motion.     Mental Status: Normal mood and affect. Normal behavior. Normal judgment and thought content.   Assessment   24 y.o. [id number] at [redacted]w[redacted]d by  11/30/2019, by Ultrasound presenting for routine prenatal visit  Plan   Pregnancy#4 Problems (from 03/11/19 to present)     Problem Noted Resolved   Supervision of normal intrauterine pregnancy in multigravida in first trimester 05/11/2019 by Tresea Mall, CNM No   Overview Addendum 09/24/2019  2:28 PM by Vena Austria, MD    [facility] Prenatal Labs  Dating EDD by 11w u/s Blood type: B/Negative/-- (05/06 1512)   Genetic Screen      NIPS:declines Antibody:Negative (05/06 1512)  Anatomic [place] [facility] nml Rubella: 3.10 (05/06 1512) Varicella: Immune  GTT Third trimester: 133 RPR: Non Reactive (05/06 1512)   Rhogam Given at 28 weeks. HBsAg: Negative (05/06 1512)   Vaccines TDAP:                       Flu Shot: HIV: Non Reactive (05/06 1512)   Baby Food Formula                               GBS:   Contraception  Pap: 05/09/19 negative  CBB  No   CS/VBAC NA   Support Person [name]          Previous Version       Preterm labor symptoms and general obstetric precautions including but not limited to vaginal bleeding, contractions, leaking of fluid and fetal movement were reviewed in detail with the patient. Please refer to After Visit Summary for other counseling recommendations.  She is preovided iron samples today and advised to start on daily OTC iron. She has not been taking her prenatla vitamins. Strongly encouraged her to restart.  Return in about 2 weeks (around 10/16/2019) for return OB.  Mirna Mires, CNM  10/02/2019 4:39 PM

## 2019-10-16 ENCOUNTER — Ambulatory Visit (INDEPENDENT_AMBULATORY_CARE_PROVIDER_SITE_OTHER): Payer: Medicaid Other | Admitting: Obstetrics

## 2019-10-16 ENCOUNTER — Other Ambulatory Visit: Payer: Self-pay

## 2019-10-16 VITALS — BP 100/60 | Wt 152.0 lb

## 2019-10-16 DIAGNOSIS — Z3A33 33 weeks gestation of pregnancy: Secondary | ICD-10-CM

## 2019-10-16 DIAGNOSIS — D509 Iron deficiency anemia, unspecified: Secondary | ICD-10-CM

## 2019-10-16 DIAGNOSIS — Z3480 Encounter for supervision of other normal pregnancy, unspecified trimester: Secondary | ICD-10-CM

## 2019-10-16 NOTE — Progress Notes (Signed)
C/O re-occurring headaches.rj

## 2019-10-16 NOTE — Progress Notes (Signed)
Routine Prenatal Care Visit  Subjective  Jennifer [name] is a 24 y.o. [phone] at [redacted]w[redacted]d being seen today for ongoing prenatal care.  She is currently monitored for the following issues for this low-risk pregnancy and has ADHD (attention deficit hyperactivity disorder); Allergic rhinitis; Marijuana use; Rh negative state in antepartum period; Iron deficiency anemia; Tobacco use during pregnancy, antepartum; and Supervision of normal intrauterine pregnancy in multigravida in first trimester on their problem list.  ----------------------------------------------------------------------------------- Patient reports headache, no contractions, no cramping and no leaking.  Her headaches have returned, and she has also started back smoking about three cigs per day.  .  .   [name] denies.  ----------------------------------------------------------------------------------- The following portions of the patient's history were reviewed and updated as appropriate: allergies, current medications, past family history, past medical history, past social history, past surgical history and problem list. Problem list updated.  Objective  Blood pressure 100/60, weight 152 lb (68.9 kg), last menstrual period 03/11/2019, unknown if currently breastfeeding. Pregravid weight 125 lb (56.7 kg) Total Weight Gain 27 lb (12.2 kg) Urinalysis: Urine Protein    Urine Glucose    Fetal Status:           General:  Alert, oriented and cooperative. Patient is in no acute distress.  Skin: Skin is warm and dry. No rash noted.   Cardiovascular: Normal heart rate noted  Respiratory: Normal respiratory effort, no problems with respiration noted  Abdomen: Soft, gravid, appropriate for gestational age.       Pelvic:  Cervical exam deferred        Extremities: Normal range of motion.     Mental Status: Normal mood and affect. Normal behavior. Normal judgment and thought content.   Assessment   24 y.o. [id number] at [redacted]w[redacted]d by   11/30/2019, by Ultrasound presenting for routine prenatal visit  Plan   Pregnancy#4 Problems (from 03/11/19 to present)    Problem Noted Resolved   Supervision of normal intrauterine pregnancy in multigravida in first trimester 05/11/2019 by Tresea Mall, CNM No   Overview Addendum 10/02/2019  5:31 PM by Mirna Mires, CNM    [facility]atal [facility]  Dating EDD by 11w u/s Blood type: B/Negative/-- (05/06 1512)   Genetic Screen      NIPS:declines Antibody:Negative (05/06 1512)  Anatomic [place] [facility] nml Rubella: 3.10 (05/06 1512) Varicella: Immune  GTT Third trimester: 133 RPR: Non Reactive (05/06 1512)   Rhogam Given at 28 weeks. HBsAg: Negative (05/06 1512)   Vaccines TDAP:     Given 10/02/19                  Flu Shot: HIV: Non Reactive (05/06 1512)   Baby Food Formula                               GBS:   Contraception  Pap: 05/09/19 negative  CBB  No   CS/VBAC NA   Support Person [name]          Previous Version       Preterm labor symptoms and general obstetric precautions including but not limited to vaginal bleeding, contractions, leaking of fluid and fetal movement were reviewed in detail with the patient. Please refer to After Visit Summary for other counseling recommendations.  We discussed  her smoking. She is encouraged not to smoke. For her headaches, suggested she try Tylenol, cold cloths to her head, limited caffeine. Additional samples of iron  provided. She will need to have another CBC drawn.  No follow-ups on file.  Mirna Mires, CNM  10/16/2019 4:44 PM

## 2019-10-30 ENCOUNTER — Other Ambulatory Visit: Payer: Self-pay

## 2019-10-30 ENCOUNTER — Ambulatory Visit (INDEPENDENT_AMBULATORY_CARE_PROVIDER_SITE_OTHER): Payer: Medicaid Other | Admitting: Obstetrics and Gynecology

## 2019-10-30 VITALS — BP 110/62 | Wt 153.0 lb

## 2019-10-30 DIAGNOSIS — Z3A35 35 weeks gestation of pregnancy: Secondary | ICD-10-CM

## 2019-10-30 DIAGNOSIS — Z3481 Encounter for supervision of other normal pregnancy, first trimester: Secondary | ICD-10-CM

## 2019-10-30 NOTE — Progress Notes (Signed)
    Routine Prenatal Care Visit  Subjective  Jennifer [name] is a 24 y.o. [phone] at [redacted]w[redacted]d being seen today for ongoing prenatal care.  She is currently monitored for the following issues for this low-risk pregnancy and has ADHD (attention deficit hyperactivity disorder); Allergic rhinitis; Marijuana use; Rh negative state in antepartum period; Iron deficiency anemia; Tobacco use during pregnancy, antepartum; and Supervision of normal intrauterine pregnancy in multigravida in first trimester on their problem list.  ----------------------------------------------------------------------------------- Patient reports no complaints.   Contractions: Not present. Vag. Bleeding: None.  Movement: Present. Denies leaking of fluid.  ----------------------------------------------------------------------------------- The following portions of the patient's history were reviewed and updated as appropriate: allergies, current medications, past family history, past medical history, past social history, past surgical history and problem list. Problem list updated.   Objective  Blood pressure 110/62, weight 153 lb (69.4 kg), last menstrual period 03/11/2019, unknown if currently breastfeeding. Pregravid weight 125 lb (56.7 kg) Total Weight Gain 28 lb (12.7 kg) Urinalysis:      Fetal Status: Fetal Heart Rate (bpm): 135 Fundal Height: 34 cm Movement: Present     General:  Alert, oriented and cooperative. Patient is in no acute distress.  Skin: Skin is warm and dry. No rash noted.   Cardiovascular: Normal heart rate noted  Respiratory: Normal respiratory effort, no problems with respiration noted  Abdomen: Soft, gravid, appropriate for gestational age. Pain/Pressure: Absent     Pelvic:  Cervical exam deferred        Extremities: Normal range of motion.  Edema: None  ental Status: Normal mood and affect. Normal behavior. Normal judgment and thought content.     Assessment   24 y.o. [id number] at [redacted]w[redacted]d by   11/30/2019, by Ultrasound presenting for routine prenatal visit  Plan   Pregnancy#4 Problems (from 03/11/19 to present)    Problem Noted Resolved   Supervision of normal intrauterine pregnancy in multigravida in first trimester 05/11/2019 by Tresea Mall, CNM No   Overview Addendum 10/02/2019  5:31 PM by Mirna Mires, CNM    [facility] Prenatal Labs  Dating EDD by 11w u/s Blood type: B/Negative/-- (05/06 1512)   Genetic Screen      NIPS:declines Antibody:Negative (05/06 1512)  Anatomic [place] [facility] nml Rubella: 3.10 (05/06 1512) Varicella: Immune  GTT Third trimester: 133 RPR: Non Reactive (05/06 1512)   Rhogam Given at 28 weeks. HBsAg: Negative (05/06 1512)   Vaccines TDAP:     Given 10/02/19                  Flu Shot: HIV: Non Reactive (05/06 1512)   Baby Food Formula                               GBS:   Contraception  Pap: 05/09/19 negative  CBB  No   CS/VBAC NA   Support Person [name]          Previous Version       Gestational age appropriate obstetric precautions including but not limited to vaginal bleeding, contractions, leaking of fluid and fetal movement were reviewed in detail with the patient.    Return in about 1 week (around 11/06/2019) for ROB.  Vena Austria, MD, [facility], [facility] 10/30/2019, 5:07 PM

## 2019-10-31 ENCOUNTER — Telehealth: Payer: Self-pay | Admitting: Obstetrics and Gynecology

## 2019-10-31 NOTE — Telephone Encounter (Signed)
Patient is calling needs to speak with a nurse about prescriptions. Please call patient.

## 2019-10-31 NOTE — Telephone Encounter (Signed)
Pt states she has sinus congestion; another doctor told her to get a rx for flonase.  Adv flonase was OTC and not on our safe meds list.  Adv plain sudafed, e.s. tylenol, sip hot beverages/soup.

## 2019-11-06 ENCOUNTER — Telehealth: Payer: Self-pay | Admitting: Obstetrics and Gynecology

## 2019-11-06 ENCOUNTER — Other Ambulatory Visit: Payer: Self-pay

## 2019-11-06 ENCOUNTER — Other Ambulatory Visit (HOSPITAL_COMMUNITY)
Admission: RE | Admit: 2019-11-06 | Discharge: 2019-11-06 | Disposition: A | Discharge status: Home / Self Care | Payer: Medicaid Other | Source: Ambulatory Visit | Attending: Obstetrics and Gynecology | Admitting: Obstetrics and Gynecology

## 2019-11-06 ENCOUNTER — Ambulatory Visit (INDEPENDENT_AMBULATORY_CARE_PROVIDER_SITE_OTHER): Payer: Medicaid Other | Admitting: Obstetrics and Gynecology

## 2019-11-06 VITALS — BP 120/80 | Wt 152.0 lb

## 2019-11-06 DIAGNOSIS — Z3A36 36 weeks gestation of pregnancy: Secondary | ICD-10-CM

## 2019-11-06 DIAGNOSIS — Z113 Encounter for screening for infections with a predominantly sexual mode of transmission: Secondary | ICD-10-CM | POA: Insufficient documentation

## 2019-11-06 DIAGNOSIS — Z23 Encounter for immunization: Secondary | ICD-10-CM | POA: Diagnosis not present

## 2019-11-06 DIAGNOSIS — Z3481 Encounter for supervision of other normal pregnancy, first trimester: Secondary | ICD-10-CM

## 2019-11-06 DIAGNOSIS — Z3685 Encounter for antenatal screening for Streptococcus B: Secondary | ICD-10-CM

## 2019-11-06 NOTE — Telephone Encounter (Signed)
Patient had a question about spot that is on her daughters head AMS had told her what it was and couldn't remember.   Cb# [phone]

## 2019-11-06 NOTE — Progress Notes (Signed)
    Routine Prenatal Care Visit  Subjective  Jennifer [name] is a 24 y.o. [phone] at [redacted]w[redacted]d being seen today for ongoing prenatal care.  She is currently monitored for the following issues for this low-risk pregnancy and has ADHD (attention deficit hyperactivity disorder); Allergic rhinitis; Marijuana use; Rh negative state in antepartum period; Iron deficiency anemia; Tobacco use during pregnancy, antepartum; and Supervision of normal intrauterine pregnancy in multigravida in first trimester on their problem list.  ----------------------------------------------------------------------------------- Patient reports no complaints.   Contractions: Irregular. Vag. Bleeding: None.  Movement: Present. Denies leaking of fluid.  ----------------------------------------------------------------------------------- The following portions of the patient's history were reviewed and updated as appropriate: allergies, current medications, past family history, past medical history, past social history, past surgical history and problem list. Problem list updated.   Objective  Blood pressure 120/80, weight 152 lb (68.9 kg), last menstrual period 03/11/2019, unknown if currently breastfeeding. Pregravid weight 125 lb (56.7 kg) Total Weight Gain 27 lb (12.2 kg) Urinalysis:      Fetal Status: Fetal Heart Rate (bpm): 140 Fundal Height: 36 cm Movement: Present  Presentation: Vertex  General:  Alert, oriented and cooperative. Patient is in no acute distress.  Skin: Skin is warm and dry. No rash noted.   Cardiovascular: Normal heart rate noted  Respiratory: Normal respiratory effort, no problems with respiration noted  Abdomen: Soft, gravid, appropriate for gestational age. Pain/Pressure: Present     Pelvic:  Cervical exam performed Dilation: 1 Effacement (%): 50 Station: -3  Extremities: Normal range of motion.     ental Status: Normal mood and affect. Normal behavior. Normal judgment and thought content.      Assessment   24 y.o. [id number] at [redacted]w[redacted]d by  11/30/2019, by Ultrasound presenting for routine prenatal visit  Plan   Pregnancy#4 Problems (from 03/11/19 to present)    Problem Noted Resolved   Supervision of normal intrauterine pregnancy in multigravida in first trimester 05/11/2019 by Tresea Mall, CNM No   Overview Addendum 10/02/2019  5:31 PM by Mirna Mires, CNM    [facility] Prenatal Labs  Dating EDD by 11w u/s Blood type: B/Negative/-- (05/06 1512)   Genetic Screen      NIPS:declines Antibody:Negative (05/06 1512)  Anatomic [place] [facility] nml Rubella: 3.10 (05/06 1512) Varicella: Immune  GTT Third trimester: 133 RPR: Non Reactive (05/06 1512)   Rhogam Given at 28 weeks. HBsAg: Negative (05/06 1512)   Vaccines TDAP:     Given 10/02/19                  Flu Shot: HIV: Non Reactive (05/06 1512)   Baby Food Formula                               GBS:   Contraception  Pap: 05/09/19 negative  CBB  No   CS/VBAC NA   Support Person Cori Razor          Previous Version       Gestational age appropriate obstetric precautions including but not limited to vaginal bleeding, contractions, leaking of fluid and fetal movement were reviewed in detail with the patient.    - gbs aptima today  Return in about 1 week (around 11/13/2019) for ROB.  Vena Austria, MD, [facility], [facility] 11/06/2019, 3:50 PM

## 2019-11-07 NOTE — Telephone Encounter (Signed)
Called pt no answer, impetigo is what Dr Bonney Aid suggested it was, but pt need to take child to pediatrician to get definite diagnosis& would need antibiotic

## 2019-11-08 ENCOUNTER — Encounter: Payer: Self-pay | Admitting: Obstetrics and Gynecology

## 2019-11-08 DIAGNOSIS — B951 Streptococcus, group B, as the cause of diseases classified elsewhere: Secondary | ICD-10-CM | POA: Insufficient documentation

## 2019-11-08 LAB — CERVICOVAGINAL ANCILLARY ONLY
Chlamydia: NEGATIVE
Comment: NEGATIVE
Comment: NORMAL
Neisseria Gonorrhea: NEGATIVE

## 2019-11-08 LAB — STREP GP B NAA: Strep Gp B NAA: POSITIVE — AB

## 2019-11-13 ENCOUNTER — Other Ambulatory Visit: Payer: Self-pay

## 2019-11-13 ENCOUNTER — Ambulatory Visit (INDEPENDENT_AMBULATORY_CARE_PROVIDER_SITE_OTHER): Payer: Medicaid Other | Admitting: Obstetrics

## 2019-11-13 VITALS — BP 90/50 | Wt 152.0 lb

## 2019-11-13 DIAGNOSIS — Z3403 Encounter for supervision of normal first pregnancy, third trimester: Secondary | ICD-10-CM

## 2019-11-13 DIAGNOSIS — Z3A37 37 weeks gestation of pregnancy: Secondary | ICD-10-CM

## 2019-11-13 LAB — POCT URINALYSIS DIPSTICK OB
Glucose, UA: NEGATIVE
POC,PROTEIN,UA: NEGATIVE

## 2019-11-13 NOTE — Progress Notes (Signed)
  Routine Prenatal Care Visit  Subjective  Jennifer [name] is a 24 y.o. [phone] at [redacted]w[redacted]d being seen today for ongoing prenatal care.  She is currently monitored for the following issues for this low-risk pregnancy and has ADHD (attention deficit hyperactivity disorder); Allergic rhinitis; Marijuana use; Rh negative state in antepartum period; Iron deficiency anemia; Tobacco use during pregnancy, antepartum; Supervision of normal intrauterine pregnancy in multigravida in first trimester; and Positive GBS test on their problem list.  ----------------------------------------------------------------------------------- Patient reports no complaints.    .  .   [name] denies.  ----------------------------------------------------------------------------------- The following portions of the patient's history were reviewed and updated as appropriate: allergies, current medications, past family history, past medical history, past social history, past surgical history and problem list. Problem list updated.  Objective  Blood pressure (!) 90/50, weight 152 lb (68.9 kg), last menstrual period 03/11/2019, unknown if currently breastfeeding. Pregravid weight 125 lb (56.7 kg) Total Weight Gain 27 lb (12.2 kg) Urinalysis: Urine Protein Negative  Urine Glucose Negative  Fetal Status:           General:  Alert, oriented and cooperative. Patient is in no acute distress.  Skin: Skin is warm and dry. No rash noted.   Cardiovascular: Normal heart rate noted  Respiratory: Normal respiratory effort, no problems with respiration noted  Abdomen: Soft, gravid, appropriate for gestational age.       Pelvic:  Cervical exam performed      cervix is quite posterior, tight 1cm/thick/ballotable. multip cervix.  Extremities: Normal range of motion.     Mental Status: Normal mood and affect. Normal behavior. Normal judgment and thought content.   Assessment   24 y.o. [id number] at [redacted]w[redacted]d by  11/30/2019, by Ultrasound  presenting for routine prenatal visit  Plan   Pregnancy#4 Problems (from 03/11/19 to present)    Problem Noted Resolved   Positive GBS test 11/08/2019 by Vena Austria, MD No   Supervision of normal intrauterine pregnancy in multigravida in first trimester 05/11/2019 by Tresea Mall, CNM No   Overview Addendum 11/08/2019  1:23 PM by Vena Austria, MD    [facility] Prenatal Labs  Dating EDD by 11w u/s Blood type: B/Negative/-- (05/06 1512)   Genetic Screen      NIPS:declines Antibody:Negative (05/06 1512)  Anatomic [place] [facility] nml Rubella: 3.10 (05/06 1512) Varicella: Immune  GTT Third trimester: 133 RPR: Non Reactive (05/06 1512)   Rhogam Given at 28 weeks. HBsAg: Negative (05/06 1512)   Vaccines TDAP:     Given 10/02/19                  Flu Shot: HIV: Non Reactive (05/06 1512)   Baby Food Formula                               GBS: Positive  Contraception  Pap: 05/09/19 negative  CBB  No   CS/VBAC NA   Support Person [name]          Previous Version       Term labor symptoms and general obstetric precautions including but not limited to vaginal bleeding, contractions, leaking of fluid and fetal movement were reviewed in detail with the patient. Please refer to After Visit Summary for other counseling recommendations.   Return in about 1 week (around 11/20/2019) for return OB.  Mirna Mires, CNM  11/13/2019 4:22 PM

## 2019-11-13 NOTE — Progress Notes (Signed)
C/o cramping, back pain. rj

## 2019-11-19 ENCOUNTER — Other Ambulatory Visit: Payer: Self-pay

## 2019-11-19 ENCOUNTER — Encounter: Payer: Self-pay | Admitting: Obstetrics and Gynecology

## 2019-11-19 ENCOUNTER — Ambulatory Visit (INDEPENDENT_AMBULATORY_CARE_PROVIDER_SITE_OTHER): Payer: Medicaid Other | Admitting: Obstetrics and Gynecology

## 2019-11-19 VITALS — BP 110/74 | Wt 155.0 lb

## 2019-11-19 DIAGNOSIS — O0993 Supervision of high risk pregnancy, unspecified, third trimester: Secondary | ICD-10-CM

## 2019-11-19 DIAGNOSIS — O26899 Other specified pregnancy related conditions, unspecified trimester: Secondary | ICD-10-CM

## 2019-11-19 DIAGNOSIS — Z6791 Unspecified blood type, Rh negative: Secondary | ICD-10-CM

## 2019-11-19 DIAGNOSIS — B951 Streptococcus, group B, as the cause of diseases classified elsewhere: Secondary | ICD-10-CM

## 2019-11-19 DIAGNOSIS — Z3A38 38 weeks gestation of pregnancy: Secondary | ICD-10-CM

## 2019-11-19 NOTE — Progress Notes (Signed)
  Routine Prenatal Care Visit  Subjective  Jennifer [name] is a 24 y.o. [phone] at [redacted]w[redacted]d being seen today for ongoing prenatal care.  She is currently monitored for the following issues for this low-risk pregnancy and has ADHD (attention deficit hyperactivity disorder); Allergic rhinitis; Marijuana use; Rh negative state in antepartum period; Iron deficiency anemia; Tobacco use during pregnancy, antepartum; Supervision of high risk pregnancy, antepartum; and Positive GBS test on their problem list.  ----------------------------------------------------------------------------------- Patient reports no complaints.   Contractions: Irregular. Vag. Bleeding: None.  Movement: Present. Leaking Fluid denies.  ----------------------------------------------------------------------------------- The following portions of the patient's history were reviewed and updated as appropriate: allergies, current medications, past family history, past medical history, past social history, past surgical history and problem list. Problem list updated.  Objective  Blood pressure 110/74, weight 155 lb (70.3 kg), last menstrual period 03/11/2019, unknown if currently breastfeeding. Pregravid weight 125 lb (56.7 kg) Total Weight Gain 30 lb (13.6 kg) Urinalysis: Urine Protein    Urine Glucose    Fetal Status: Fetal Heart Rate (bpm): 150 Fundal Height: 38 cm Movement: Present  Presentation: Vertex  General:  Alert, oriented and cooperative. Patient is in no acute distress.  Skin: Skin is warm and dry. No rash noted.   Cardiovascular: Normal heart rate noted  Respiratory: Normal respiratory effort, no problems with respiration noted  Abdomen: Soft, gravid, appropriate for gestational age. Pain/Pressure: Absent     Pelvic:  Cervical exam performed Dilation: 1 Effacement (%): Thick Station: Ballotable  Extremities: Normal range of motion.  Edema: None  Mental Status: Normal mood and affect. Normal behavior. Normal judgment  and thought content.   Assessment   24 y.o. [id number] at [redacted]w[redacted]d by  11/30/2019, by Ultrasound presenting for routine prenatal visit  Plan   Pregnancy#4 Problems (from 03/11/19 to present)    Problem Noted Resolved   Positive GBS test 11/08/2019 by Vena Austria, MD No   Supervision of high risk pregnancy, antepartum 05/11/2019 by Tresea Mall, CNM No   Overview Addendum 11/08/2019  1:23 PM by Vena Austria, MD    [facility] Prenatal Labs  Dating EDD by 11w u/s Blood type: B/Negative/-- (05/06 1512)   Genetic Screen      NIPS:declines Antibody:Negative (05/06 1512)  Anatomic [place] [facility] nml Rubella: 3.10 (05/06 1512) Varicella: Immune  GTT Third trimester: 133 RPR: Non Reactive (05/06 1512)   Rhogam Given at 28 weeks. HBsAg: Negative (05/06 1512)   Vaccines TDAP:     Given 10/02/19                  Flu Shot: HIV: Non Reactive (05/06 1512)   Baby Food Formula                               GBS: Positive  Contraception  Pap: 05/09/19 negative  CBB  No   CS/VBAC NA   Support Person Jennifer [name]          Previous Version       Term labor symptoms and general obstetric precautions including but not limited to vaginal bleeding, contractions, leaking of fluid and fetal movement were reviewed in detail with the patient. Please refer to After Visit Summary for other counseling recommendations.   Return in about 1 week (around 11/26/2019) for Routine Prenatal Appointment.   Thomasene Mohair, MD, [facility], [facility] 11/19/2019 3:40 PM

## 2019-11-26 ENCOUNTER — Other Ambulatory Visit: Payer: Self-pay

## 2019-11-26 ENCOUNTER — Ambulatory Visit (INDEPENDENT_AMBULATORY_CARE_PROVIDER_SITE_OTHER): Payer: Medicaid Other | Admitting: Obstetrics

## 2019-11-26 VITALS — BP 100/70 | Wt 155.0 lb

## 2019-11-26 DIAGNOSIS — Z3A39 39 weeks gestation of pregnancy: Secondary | ICD-10-CM

## 2019-11-26 DIAGNOSIS — O0993 Supervision of high risk pregnancy, unspecified, third trimester: Secondary | ICD-10-CM

## 2019-11-26 LAB — POCT URINALYSIS DIPSTICK OB
Glucose, UA: NEGATIVE
POC,PROTEIN,UA: NEGATIVE

## 2019-11-26 NOTE — Progress Notes (Signed)
  Routine Prenatal Care Visit  Subjective  Jennifer [name] is a 24 y.o. [phone] at [redacted]w[redacted]d being seen today for ongoing prenatal care.  She is currently monitored for the following issues for this low-risk pregnancy and has ADHD (attention deficit hyperactivity disorder); Allergic rhinitis; Marijuana use; Rh negative state in antepartum period; Iron deficiency anemia; Tobacco use during pregnancy, antepartum; Supervision of high risk pregnancy, antepartum; and Positive GBS test on their problem list.  ----------------------------------------------------------------------------------- Patient reports no complaints.    .  .   [name] denies.  ----------------------------------------------------------------------------------- The following portions of the patient's history were reviewed and updated as appropriate: allergies, current medications, past family history, past medical history, past social history, past surgical history and problem list. Problem list updated.  Objective  Blood pressure 100/70, weight 155 lb (70.3 kg), last menstrual period 03/11/2019, unknown if currently breastfeeding. Pregravid weight 125 lb (56.7 kg) Total Weight Gain 30 lb (13.6 kg) Urinalysis: Urine Protein Negative  Urine Glucose Negative  Fetal Status:           General:  Alert, oriented and cooperative. Patient is in no acute distress.  Skin: Skin is warm and dry. No rash noted.   Cardiovascular: Normal heart rate noted  Respiratory: Normal respiratory effort, no problems with respiration noted  Abdomen: Soft, gravid, appropriate for gestational age.       Pelvic:  Cervical exam performed      3cms/thick/-3 cervix is posterior.  Extremities: Normal range of motion.     Mental Status: Normal mood and affect. Normal behavior. Normal judgment and thought content.   Assessment   24 y.o. [id number] at [redacted]w[redacted]d by  11/30/2019, by Ultrasound presenting for routine prenatal visit  Plan   Pregnancy#4 Problems  (from 03/11/19 to present)    Problem Noted Resolved   Positive GBS test 11/08/2019 by Vena Austria, MD No   Supervision of high risk pregnancy, antepartum 05/11/2019 by Tresea Mall, CNM No   Overview Addendum 11/08/2019  1:23 PM by Vena Austria, MD    [facility] Prenatal [facility]  Dating EDD by 11w u/s Blood type: B/Negative/-- (05/06 1512)   Genetic Screen      NIPS:declines Antibody:Negative (05/06 1512)  Anatomic [place] [facility] nml Rubella: 3.10 (05/06 1512) Varicella: Immune  GTT Third trimester: 133 RPR: Non Reactive (05/06 1512)   Rhogam Given at 28 weeks. HBsAg: Negative (05/06 1512)   Vaccines TDAP:     Given 10/02/19                  Flu Shot: HIV: Non Reactive (05/06 1512)   Baby Food Formula                               GBS: Positive  Contraception  Pap: 05/09/19 negative  CBB  No   CS/VBAC NA   Support Person [name]          Previous Version       Term labor symptoms and general obstetric precautions including but not limited to vaginal bleeding, contractions, leaking of fluid and fetal movement were reviewed in detail with the patient. With her permision, the cervix is well swept. Please refer to After Visit Summary for other counseling recommendations.   Return in about 1 week (around 12/03/2019) for return OB.  Mirna Mires, CNM  11/26/2019 3:02 PM

## 2019-11-26 NOTE — Progress Notes (Signed)
C/o cramping this week; B-H ctxs off and on.rj

## 2019-11-29 ENCOUNTER — Other Ambulatory Visit: Payer: Self-pay

## 2019-11-29 ENCOUNTER — Encounter: Payer: Self-pay | Admitting: Obstetrics & Gynecology

## 2019-11-29 ENCOUNTER — Ambulatory Visit (INDEPENDENT_AMBULATORY_CARE_PROVIDER_SITE_OTHER): Payer: Medicaid Other | Admitting: Obstetrics & Gynecology

## 2019-11-29 VITALS — BP 110/70 | Wt 157.0 lb

## 2019-11-29 DIAGNOSIS — Z3A39 39 weeks gestation of pregnancy: Secondary | ICD-10-CM

## 2019-11-29 DIAGNOSIS — Z0371 Encounter for suspected problem with amniotic cavity and membrane ruled out: Secondary | ICD-10-CM

## 2019-11-29 DIAGNOSIS — O0993 Supervision of high risk pregnancy, unspecified, third trimester: Secondary | ICD-10-CM

## 2019-11-29 NOTE — Patient Instructions (Addendum)
Labor Induction 11/30/19 at 0500  Labor induction is when steps are taken to cause a pregnant woman to begin the labor process. Most women go into labor on their own between 37 weeks and 42 weeks of pregnancy. When this does not happen or when there is a medical need for labor to begin, steps may be taken to induce labor. Labor induction causes a pregnant woman's uterus to contract. It also causes the cervix to soften (ripen), open (dilate), and thin out (efface). Usually, labor is not induced before 39 weeks of pregnancy unless there is a medical reason to do so. Your health care provider will determine if labor induction is needed. Before inducing labor, your health care provider will consider a number of factors, including:  Your medical condition and your baby's.  How many weeks along you are in your pregnancy.  How mature your baby's lungs are.  The condition of your cervix.  The position of your baby.  The size of your birth canal. What are some reasons for labor induction? Labor may be induced if:  Your health or your baby's health is at risk.  Your pregnancy is overdue by 1 week or more.  Your water breaks but labor does not start on its own.  There is a low amount of amniotic fluid around your baby. You may also choose (elect) to have labor induced at a certain time. Generally, elective labor induction is done no earlier than 39 weeks of pregnancy. What methods are used for labor induction? Methods used for labor induction include:  Prostaglandin medicine. This medicine starts contractions and causes the cervix to dilate and ripen. It can be taken by mouth (orally) or by being inserted into the vagina (suppository).  Inserting a small, thin tube (catheter) with a balloon into the vagina and then expanding the balloon with water to dilate the cervix.  Stripping the membranes. In this method, your health care provider gently separates amniotic sac tissue from the cervix.  This causes the cervix to stretch, which in turn causes the release of a hormone called progesterone. The hormone causes the uterus to contract. This procedure is often done during an office visit, after which you will be sent home to wait for contractions to begin.  Breaking the water. In this method, your health care provider uses a small instrument to make a small hole in the amniotic sac. This eventually causes the amniotic sac to break. Contractions should begin after a few hours.  Medicine to trigger or strengthen contractions. This medicine is given through an IV that is inserted into a vein in your arm. Except for membrane stripping, which can be done in a clinic, labor induction is done in the hospital so that you and your baby can be carefully monitored. How long does it take for labor to be induced? The length of time it takes to induce labor depends on how ready your body is for labor. Some inductions can take up to 2-3 days, while others may take less than a day. Induction may take longer if:  You are induced early in your pregnancy.  It is your first pregnancy.  Your cervix is not ready. What are some risks associated with labor induction? Some risks associated with labor induction include:  Changes in fetal heart rate, such as being too high, too low, or irregular (erratic).  Failed induction.  Infection in the mother or the baby.  Increased risk of having a cesarean delivery.  Fetal death.  Breaking off (abruption) of the placenta from the uterus (rare).  Rupture of the uterus (very rare). When induction is needed for medical reasons, the benefits of induction generally outweigh the risks. What are some reasons for not inducing labor? Labor induction should not be done if:  Your baby does not tolerate contractions.  You have had previous surgeries on your uterus, such as a myomectomy, removal of fibroids, or a vertical scar from a previous cesarean  delivery.  Your placenta lies very low in your uterus and blocks the opening of the cervix (placenta previa).  Your baby is not in a head-down position.  The umbilical cord drops down into the birth canal in front of the baby.  There are unusual circumstances, such as the baby being very early (premature).  You have had more than 2 previous cesarean deliveries. Summary  Labor induction is when steps are taken to cause a pregnant woman to begin the labor process.  Labor induction causes a pregnant woman's uterus to contract. It also causes the cervix to ripen, dilate, and efface.  Labor is not induced before 39 weeks of pregnancy unless there is a medical reason to do so.  When induction is needed for medical reasons, the benefits of induction generally outweigh the risks. This information is not intended to replace advice given to you by your health care provider. Make sure you discuss any questions you have with your health care provider. Document Revised: 12/31/2016 Document Reviewed: 02/11/2016 Elsevier Patient Education  2020 [employer].

## 2019-11-29 NOTE — Telephone Encounter (Signed)
Pt calling; 39wks; leaking fluid now and then.  [phone]  Pt has been leaking since yesterday; states the fluid does not smell like urine; ctxs are irreg.  Per PH he will see her this pm.  Pt aware and is on her way.

## 2019-11-29 NOTE — Progress Notes (Signed)
  [facility] REGIONAL BIRTHPLACE INDUCTION ASSESSMENT Jennifer [name] May 16, 1995 Medical record #: [id number] Phone #:  Home Phone [phone]  Mobile [phone]    Prenatal Provider:[facility] Proposed admission date/time:11/19 at 0500 Method of induction:Pitocin  Weight: Filed Weights11/18/21 1532Weight:157 lb (71.2 kg) BMI Body mass index is 28.72 kg/m. HIV Negative HSV Negative EDC Estimated Date of Delivery: 11/19/21based on:LMP  Gestational age on admission: 46 Gravidity/parity:G4P2012  Cervix Score   0 1 2 3   Position Posterior Midposition Anterior   Consistency Firm Medium Soft   Effacement (%) 0-30 40-50 60-70 >80  Dilation (cm) Closed 1-2 3-4 >5  Baby's station -3 -2 -1 +1, +2   Bishop Score:7  select indication(s) below Elective induction ?39 weeks multiparous patient ?39 weeks primiparous patient with Bishop score ?7 ?40 weeks primiparous patient   Provider Signature: Scheduled by:Letitia Libra!!! Date:11/29/2019 4:15 PM   Call [phone] to finalize the induction date/time  [phone] (07/17)

## 2019-11-29 NOTE — Progress Notes (Signed)
History and Physical  Jennifer [name] is a 24 y.o. [id number] [redacted]w[redacted]d  for Induction of Labor scheduled due to Favorable cervix at term .   See labor record for pregnancy highlights.  No recent pain, bleeding, ruptured membranes, or other signs of progressing labor.  PMHx: She  has a past medical history of ADHD (attention deficit hyperactivity disorder), combined type (10/27/2017), Adjustment disorder with mixed anxiety and depressed mood (10/27/2017), Anemia, Anxiety, Environmental and seasonal allergies, and Lost custody of children (02/28/2018). Also,  has a past surgical history that includes Wisdom tooth extraction., family history includes Breast cancer in an other family member; Cancer (age of onset: 81) in an other family member; Hypertension in her maternal grandmother.,  reports that she has been smoking cigarettes. She has been smoking about 1.00 pack per day. She has never used smokeless tobacco. She reports previous alcohol use. She reports current drug use. Drug: Marijuana. She has a current medication list which includes the following prescription(s): nystatin, prenatal vit-fe fumarate-fa, and sertraline. Also, is allergic to vicodin [hydrocodone-acetaminophen]. OB History  Gravida Para Term Preterm AB Living  4 2 2   1 2   SAB TAB Ectopic Multiple Live Births    1   0 2    # Outcome Date GA Lbr Len/2nd Weight Sex Delivery Anes PTL Lv  4 Current           3 Term 08/27/17 [redacted]w[redacted]d 11:23 / 00:05 8 lb 11.3 oz (3.95 kg) F Vag-Spont EPI  LIV  2 Term 09/03/15 [redacted]w[redacted]d / 00:24 7 lb 7.2 oz (3.38 kg) M Vag-Spont Gen  LIV  1 TAB             Obstetric Comments  IOL for postdates with G1  Patient denies any other pertinent gynecologic issues.   Review of Systems  Constitutional: Negative for chills, fever and malaise/fatigue.  HENT: Negative for congestion, sinus pain and sore throat.   Eyes: Negative for blurred vision and pain.  Respiratory: Negative for cough and wheezing.   Cardiovascular:  Negative for chest pain and leg swelling.  Gastrointestinal: Negative for abdominal pain, constipation, diarrhea, heartburn, nausea and vomiting.  Genitourinary: Negative for dysuria, frequency, hematuria and urgency.  Musculoskeletal: Negative for back pain, joint pain, myalgias and neck pain.  Skin: Negative for itching and rash.  Neurological: Negative for dizziness, tremors and weakness.  Endo/Heme/Allergies: Does not bruise/bleed easily.  Psychiatric/Behavioral: Negative for depression. The patient is not nervous/anxious and does not have insomnia.     Objective: BP 110/70    Wt 157 lb (71.2 kg)    LMP 03/11/2019 (Within Weeks)    BMI 28.72 kg/m  Physical Exam Constitutional:      General: She is not in acute distress.    Appearance: She is well-developed.  Genitourinary:     Pelvic exam was performed with patient supine.     Vagina, uterus and rectum normal.     No lesions in the vagina.     No vaginal bleeding.     No cervical motion tenderness, friability, lesion or polyp.     Uterus is mobile.     Uterus is not enlarged.     No uterine mass detected.    Uterus is midaxial.     No right or left adnexal mass present.     Right adnexa not tender.     Left adnexa not tender.     Genitourinary Comments: Cx 3-4/60/-3, soft, mid   HENT:  Head: Normocephalic and atraumatic. No laceration.     Right Ear: Hearing normal.     Left Ear: Hearing normal.     Mouth/Throat:     Pharynx: Uvula midline.  Eyes:     Pupils: Pupils are equal, round, and reactive to light.  Neck:     Thyroid: No thyromegaly.  Cardiovascular:     Rate and Rhythm: Normal rate and regular rhythm.     Heart sounds: No murmur heard.  No friction rub. No gallop.   Pulmonary:     Effort: Pulmonary effort is normal. No respiratory distress.     Breath sounds: Normal breath sounds. No wheezing.  Chest:     Breasts:        Right: No mass, skin change or tenderness.        Left: No mass, skin change or  tenderness.  Abdominal:     General: Bowel sounds are normal. There is no distension.     Palpations: Abdomen is soft.     Tenderness: There is no abdominal tenderness. There is no rebound.  Musculoskeletal:        General: Normal range of motion.     Cervical back: Normal range of motion and neck supple.  Neurological:     Mental Status: She is alert and oriented to person, place, and time.     Cranial Nerves: No cranial nerve deficit.  Skin:    General: Skin is warm and dry.  Psychiatric:        Judgment: Judgment normal.  Vitals reviewed.     Assessment: Term Pregnancy for Induction of Labor due to Favorable cervix at term.  Plan: Patient will undergo induction of labor with pitocin.     Patient has been fully informed of the pros and cons, risks and benefits of continued observation with fetal monitoring versus that of induction of labor.   She understands that there are uncommon risks to induction, which include but are not limited to : frequent or prolonged uterine contractions, fetal distress, uterine rupture, and lack of successful induction.  These risks include all methods including Pitocin and Misoprostol and Cervadil.  Patient understands that using Misoprostol for labor induction is an "off label" indication although it has been studied extensively for this purpose and is an accepted method of induction.  She also has been informed of the increased risks for Cesarean with induction and should induction not be successful.  Patient consents to the induction plan of management.  Plans to bottle feed Plans (uncertain) for contraception TDaP UTD  Annamarie Major, MD, [facility], [facility] 11/29/2019  4:09 PM

## 2019-11-30 ENCOUNTER — Encounter: Payer: Self-pay | Admitting: Obstetrics & Gynecology

## 2019-11-30 ENCOUNTER — Inpatient Hospital Stay: Payer: Medicaid Other | Admitting: Anesthesiology

## 2019-11-30 ENCOUNTER — Inpatient Hospital Stay
Admission: EM | Admit: 2019-11-30 | Discharge: 2019-12-02 | DRG: 806 | Disposition: A | Discharge status: Home / Self Care | Payer: Medicaid Other | Attending: Obstetrics and Gynecology | Admitting: Obstetrics and Gynecology

## 2019-11-30 DIAGNOSIS — O9081 Anemia of the puerperium: Secondary | ICD-10-CM | POA: Diagnosis not present

## 2019-11-30 DIAGNOSIS — Z20822 Contact with and (suspected) exposure to covid-19: Secondary | ICD-10-CM | POA: Diagnosis present

## 2019-11-30 DIAGNOSIS — Z3A40 40 weeks gestation of pregnancy: Secondary | ICD-10-CM | POA: Diagnosis not present

## 2019-11-30 DIAGNOSIS — O26893 Other specified pregnancy related conditions, third trimester: Secondary | ICD-10-CM | POA: Diagnosis present

## 2019-11-30 DIAGNOSIS — O480 Post-term pregnancy: Secondary | ICD-10-CM

## 2019-11-30 DIAGNOSIS — O0990 Supervision of high risk pregnancy, unspecified, unspecified trimester: Secondary | ICD-10-CM

## 2019-11-30 DIAGNOSIS — B951 Streptococcus, group B, as the cause of diseases classified elsewhere: Secondary | ICD-10-CM | POA: Diagnosis present

## 2019-11-30 DIAGNOSIS — O99824 Streptococcus B carrier state complicating childbirth: Secondary | ICD-10-CM | POA: Diagnosis present

## 2019-11-30 DIAGNOSIS — Z6791 Unspecified blood type, Rh negative: Secondary | ICD-10-CM | POA: Diagnosis not present

## 2019-11-30 DIAGNOSIS — D62 Acute posthemorrhagic anemia: Secondary | ICD-10-CM | POA: Diagnosis not present

## 2019-11-30 DIAGNOSIS — O26899 Other specified pregnancy related conditions, unspecified trimester: Secondary | ICD-10-CM

## 2019-11-30 LAB — URINE DRUG SCREEN, QUALITATIVE (ARMC ONLY)
Amphetamines, Ur Screen: NOT DETECTED
Barbiturates, Ur Screen: NOT DETECTED
Benzodiazepine, Ur Scrn: NOT DETECTED
Cannabinoid 50 Ng, Ur ~~LOC~~: NOT DETECTED
Cocaine Metabolite,Ur ~~LOC~~: NOT DETECTED
MDMA (Ecstasy)Ur Screen: NOT DETECTED
Methadone Scn, Ur: NOT DETECTED
Opiate, Ur Screen: NOT DETECTED
Phencyclidine (PCP) Ur S: NOT DETECTED
Tricyclic, Ur Screen: NOT DETECTED

## 2019-11-30 LAB — CBC
HCT: 32.5 % — ABNORMAL LOW (ref 36.0–46.0)
Hemoglobin: 10.6 g/dL — ABNORMAL LOW (ref 12.0–15.0)
MCH: 27.1 pg (ref 26.0–34.0)
MCHC: 32.6 g/dL (ref 30.0–36.0)
MCV: 83.1 fL (ref 80.0–100.0)
Platelets: 199 10*3/uL (ref 150–400)
RBC: 3.91 MIL/uL (ref 3.87–5.11)
RDW: 13.8 % (ref 11.5–15.5)
WBC: 10.6 10*3/uL — ABNORMAL HIGH (ref 4.0–10.5)
nRBC: 0 % (ref 0.0–0.2)

## 2019-11-30 LAB — TYPE AND SCREEN
ABO/RH(D): B NEG
Antibody Screen: NEGATIVE

## 2019-11-30 LAB — RESP PANEL BY RT-PCR (FLU A&B, COVID) ARPGX2
Influenza A by PCR: NEGATIVE
Influenza B by PCR: NEGATIVE
SARS Coronavirus 2 by RT PCR: NEGATIVE

## 2019-11-30 LAB — RPR: RPR Ser Ql: NONREACTIVE

## 2019-11-30 MED ORDER — PHENYLEPHRINE 40 MCG/ML (10ML) SYRINGE FOR IV PUSH (FOR BLOOD PRESSURE SUPPORT): 80.0000 ug | PREFILLED_SYRINGE | INTRAVENOUS | Status: DC | PRN

## 2019-11-30 MED ORDER — DIPHENHYDRAMINE HCL 50 MG/ML IJ SOLN
12.5000 mg | INTRAMUSCULAR | Status: DC | PRN
  Administered 2019-11-30: 12.5 mg via INTRAVENOUS
  Filled 2019-11-30: qty 1

## 2019-11-30 MED ORDER — EPHEDRINE 5 MG/ML INJ
10.0000 mg | INTRAVENOUS | Status: AC | PRN
  Administered 2019-11-30 (×2): 10 mg via INTRAVENOUS
  Filled 2019-11-30: qty 4

## 2019-11-30 MED ORDER — TERBUTALINE SULFATE 1 MG/ML IJ SOLN: 0.2500 mg | Freq: Once | INTRAMUSCULAR | Status: DC | PRN

## 2019-11-30 MED ORDER — LIDOCAINE HCL (PF) 1 % IJ SOLN: 30.0000 mL | INTRAMUSCULAR | Status: DC | PRN

## 2019-11-30 MED ORDER — LACTATED RINGERS IV SOLN
500.0000 mL | INTRAVENOUS | Status: DC | PRN
  Administered 2019-11-30: 500 mL via INTRAVENOUS

## 2019-11-30 MED ORDER — EPHEDRINE 5 MG/ML INJ
10.0000 mg | INTRAVENOUS | Status: DC | PRN
  Administered 2019-11-30: 10 mg via INTRAVENOUS
  Filled 2019-11-30: qty 4

## 2019-11-30 MED ORDER — OXYTOCIN-SODIUM CHLORIDE 30-0.9 UT/500ML-% IV SOLN
1.0000 m[IU]/min | INTRAVENOUS | Status: DC
  Administered 2019-11-30: 1 m[IU]/min via INTRAVENOUS
  Filled 2019-11-30: qty 500

## 2019-11-30 MED ORDER — BUPIVACAINE HCL (PF) 0.25 % IJ SOLN
INTRAMUSCULAR | Status: DC | PRN
  Administered 2019-11-30: 3 mL via EPIDURAL
  Administered 2019-11-30: 5 mL via EPIDURAL

## 2019-11-30 MED ORDER — LACTATED RINGERS IV SOLN: INTRAVENOUS | Status: DC

## 2019-11-30 MED ORDER — OXYTOCIN-SODIUM CHLORIDE 30-0.9 UT/500ML-% IV SOLN
2.5000 [IU]/h | INTRAVENOUS | Status: DC
  Administered 2019-11-30: 2.5 [IU]/h via INTRAVENOUS
  Filled 2019-11-30: qty 500

## 2019-11-30 MED ORDER — LIDOCAINE-EPINEPHRINE (PF) 1.5 %-1:200000 IJ SOLN
INTRAMUSCULAR | Status: DC | PRN
  Administered 2019-11-30: 4 mL via PERINEURAL

## 2019-11-30 MED ORDER — LACTATED RINGERS IV SOLN
500.0000 mL | Freq: Once | INTRAVENOUS | Status: AC
  Administered 2019-11-30: 500 mL via INTRAVENOUS

## 2019-11-30 MED ORDER — BUTORPHANOL TARTRATE 1 MG/ML IJ SOLN: 1.0000 mg | INTRAMUSCULAR | Status: DC | PRN

## 2019-11-30 MED ORDER — IBUPROFEN 600 MG PO TABS
600.0000 mg | ORAL_TABLET | Freq: Four times a day (QID) | ORAL | Status: DC
  Administered 2019-11-30 – 2019-12-02 (×6): 600 mg via ORAL
  Filled 2019-11-30 (×7): qty 1

## 2019-11-30 MED ORDER — SODIUM CHLORIDE 0.9 % IV SOLN
5.0000 10*6.[IU] | Freq: Once | INTRAVENOUS | Status: AC
  Administered 2019-11-30: 5 10*6.[IU] via INTRAVENOUS
  Filled 2019-11-30: qty 5

## 2019-11-30 MED ORDER — FENTANYL 2.5 MCG/ML W/ROPIVACAINE 0.15% IN NS 100 ML EPIDURAL (ARMC)
12.0000 mL/h | EPIDURAL | Status: DC
  Administered 2019-11-30 (×3): 12 mL/h via EPIDURAL
  Filled 2019-11-30 (×2): qty 100

## 2019-11-30 MED ORDER — ONDANSETRON HCL 4 MG/2ML IJ SOLN
4.0000 mg | Freq: Four times a day (QID) | INTRAMUSCULAR | Status: DC | PRN
  Administered 2019-11-30: 4 mg via INTRAVENOUS
  Filled 2019-11-30: qty 2

## 2019-11-30 MED ORDER — BENZOCAINE-MENTHOL 20-0.5 % EX AERO: 1.0000 "application " | INHALATION_SPRAY | CUTANEOUS | Status: DC | PRN

## 2019-11-30 MED ORDER — ACETAMINOPHEN 325 MG PO TABS
650.0000 mg | ORAL_TABLET | ORAL | Status: DC | PRN
  Administered 2019-11-30 – 2019-12-02 (×2): 650 mg via ORAL
  Filled 2019-11-30 (×2): qty 2

## 2019-11-30 MED ORDER — LIDOCAINE HCL (PF) 1 % IJ SOLN
INTRAMUSCULAR | Status: DC | PRN
  Administered 2019-11-30: 4 mL via SUBCUTANEOUS

## 2019-11-30 MED ORDER — OXYTOCIN BOLUS FROM INFUSION
333.0000 mL | Freq: Once | INTRAVENOUS | Status: AC
  Administered 2019-11-30: 333 mL via INTRAVENOUS

## 2019-11-30 MED ORDER — SERTRALINE HCL 25 MG PO TABS
50.0000 mg | ORAL_TABLET | Freq: Every day | ORAL | Status: DC
  Filled 2019-11-30: qty 1
  Filled 2019-11-30 (×2): qty 2

## 2019-11-30 MED ORDER — FENTANYL 2.5 MCG/ML W/ROPIVACAINE 0.15% IN NS 100 ML EPIDURAL (ARMC)
EPIDURAL | Status: AC
  Filled 2019-11-30: qty 100

## 2019-11-30 MED ORDER — ACETAMINOPHEN 325 MG PO TABS
650.0000 mg | ORAL_TABLET | ORAL | Status: DC | PRN
  Administered 2019-11-30: 650 mg via ORAL
  Filled 2019-11-30: qty 2

## 2019-11-30 MED ORDER — PENICILLIN G POT IN DEXTROSE 60000 UNIT/ML IV SOLN
3.0000 10*6.[IU] | INTRAVENOUS | Status: DC
  Administered 2019-11-30 (×5): 3 10*6.[IU] via INTRAVENOUS
  Filled 2019-11-30 (×5): qty 50

## 2019-11-30 NOTE — Discharge Summary (Signed)
Postpartum Discharge Summary  Date of Service 12/02/19    Patient Name: Jennifer [name] DOB: October 04, 1995 MRN: [id number]  Date of admission: 11/30/2019 Delivery date:11/30/2019  Delivering provider: Gae Dry  Date of discharge: 12/02/2019  Admitting diagnosis: Normal labor and delivery [O80] Intrauterine pregnancy: [redacted]w[redacted]d     Secondary diagnosis:  Principal Problem:   Rh negative state in antepartum period Active Problems:   Supervision of high risk pregnancy, antepartum   Positive GBS test   Normal labor and delivery  Additional problems: None     Discharge diagnosis: Term Pregnancy Delivered                                              Post partum procedures:rhogam Augmentation: AROM and Pitocin Complications: None  Hospital course: Induction of Labor With Vaginal Delivery   24 y.o. yo [id number] at [redacted]w[redacted]d was admitted to the hospital 11/30/2019 for induction of labor.  Indication for induction: Favorable cervix at term.  Patient had an uncomplicated labor course as follows: Membrane Rupture Time/Date: 10:39 AM ,11/30/2019   Delivery Method:Vaginal, Spontaneous  Episiotomy: None  Lacerations:  None  Details of delivery can be found in separate delivery note.  Patient had a routine postpartum course. Patient is discharged home 12/02/19.  Newborn Data: Birth date:11/30/2019  Birth time:10:02 PM  Gender:Female  Living status:Living  Apgars:8 ,8  Weight:3850 g   Magnesium Sulfate received: No BMZ received: No Rhophylac:Yes MMR:No T-DaP:Given prenatally Flu: No Transfusion:No  Physical exam  Vitals:   12/01/19 1613 12/01/19 1950 12/01/19 2356 12/02/19 0747  BP: 99/60 96/70 111/76 107/70  Pulse: 86 86 86 86  Resp: $Remo'18 18 18 20  'NDOKa$ Temp: 98.2 F (36.8 C) 98.3 F (36.8 C) 98.2 F (36.8 C) 98.4 F (36.9 C)  TempSrc: Oral Oral Oral Oral  SpO2:  99% 99% 100%  Weight:      Height:       General: alert, cooperative and no distress Lochia:  appropriate Uterine Fundus: firm Incision: N/A DVT Evaluation: No evidence of DVT seen on physical exam. No cords or calf tenderness. No significant calf/ankle edema. Labs: Lab Results  Component Value Date   WBC 11.9 (H) 12/01/2019   HGB 9.3 (L) 12/01/2019   HCT 27.7 (L) 12/01/2019   MCV 83.2 12/01/2019   PLT 157 12/01/2019   CMP Latest Ref Rng & Units 05/23/2016  Glucose 65 - 99 mg/dL 107(H)  BUN 6 - 20 mg/dL 12  Creatinine 0.44 - 1.00 mg/dL 0.70  Sodium 135 - 145 mmol/L 137  Potassium 3.5 - 5.1 mmol/L 3.9  Chloride 101 - 111 mmol/L 100(L)  CO2 22 - 32 mmol/L 27  Calcium 8.9 - 10.3 mg/dL 9.3  Total Protein 6.5 - 8.1 g/dL 8.1  Total Bilirubin 0.3 - 1.2 mg/dL 0.7  Alkaline Phos 38 - 126 U/L 77  AST 15 - 41 U/L 28  ALT 14 - 54 U/L 21   Edinburgh Score: Edinburgh Postnatal Depression Scale Screening Tool 12/02/2019  I have been able to laugh and see the funny side of things. 0  I have looked forward with enjoyment to things. 0  I have blamed myself unnecessarily when things went wrong. 1  I have been anxious or worried for no good reason. 0  I have felt scared or panicky for no good reason. 0  Things have  been getting on top of me. 0  I have been so unhappy that I have had difficulty sleeping. 0  I have felt sad or miserable. 0  I have been so unhappy that I have been crying. 0  The thought of harming myself has occurred to me. 0  Edinburgh Postnatal Depression Scale Total 1      After visit meds:  Allergies as of 12/02/2019      Reactions   Vicodin [hydrocodone-acetaminophen] Itching      Medication List    TAKE these medications   ibuprofen 600 MG tablet Commonly known as: ADVIL Take 1 tablet (600 mg total) by mouth every 6 (six) hours.   PRENATAL PLUS PO Take 2 Doses by mouth daily.   sertraline 50 MG tablet Commonly known as: Zoloft Take 1 tablet (50 mg total) by mouth daily.        Discharge home in stable condition Infant Feeding:  Bottle Infant Disposition:home with mother Discharge instruction: per After Visit Summary and Postpartum booklet. Activity: Advance as tolerated. Pelvic rest for 6 weeks.  Diet: routine diet Anticipated Birth Control: OCPs Postpartum Appointment:6 weeks Additional Postpartum F/U: none  Follow up Visit:  Follow-up Information    Gae Dry, MD. Schedule an appointment as soon as possible for a visit in 6 week(s).   Specialty: Obstetrics and Gynecology Contact information: [address] [place] [address] [phone]                12/02/19  Orlie Pollen, CNM

## 2019-11-30 NOTE — Progress Notes (Signed)
  Labor Progress Note   24 y.o. [id number] @ [redacted]w[redacted]d , admitted for  Pregnancy, Labor Management.   Subjective:  Comfortable with epidural  Objective:  BP 94/62   Pulse 99   Temp (P) 97.9 F (36.6 C) (Oral)   Resp 18   Ht 5\' 2"  (1.575 m)   Wt 71.2 kg   LMP 03/11/2019 (Within Weeks)   SpO2 97%   BMI 28.72 kg/m  Abd: gravid, ND, FHT present, mild tenderness on exam Extr: no edema SVE: CERVIX: 5 cm dilated, 65 effaced, -2 station, AROM scant, bloody show Unable to rupture membranes with amni hook, FSE used for rupture and now in place for FHTs  EFM: FHR: 125 bpm, variability: moderate,  accelerations:  Present,  decelerations:  Absent Toco: Frequency: adjusting monitor and appear to be every 5-6 minutes Labs: I have reviewed the patient's lab results.   Assessment & Plan:  03/13/2019 @ [redacted]w[redacted]d, admitted for  Pregnancy and Labor/Delivery Management  1. Pain management: epidural. 2. FWB: FHT category I.  3. ID: GBS positive: has had 2 doses of penicillin 4. Labor management: restart pitocin as needed after observing effects of AROM  All discussed with patient, see orders   [redacted]w[redacted]d, CNM [facility] 11/30/2019  10:47 AM

## 2019-11-30 NOTE — Progress Notes (Signed)
Herma Carson, RN notified CNM regarding FHR deceleration and interventions done (position changes, fluid bolus and pitocin discontinued). CNM rechecked cervix and stated exam was 4/50/-2. Provider reviewed FHR strip. CNM gave order to keep pitocin off for 30 minutes.

## 2019-11-30 NOTE — Progress Notes (Signed)
  Labor Progress Note   24 y.o. [id number] @ [redacted]w[redacted]d , admitted for  Pregnancy, Labor Management.   Subjective:  Comfortable with epidural  Objective:  BP 103/66   Pulse (!) 111   Temp 98.2 F (36.8 C) (Oral)   Resp 18   Ht 5\' 2"  (1.575 m)   Wt 71.2 kg   LMP 03/11/2019 (Within Weeks)   SpO2 100%   BMI 28.72 kg/m  Abd: gravid, ND, FHT present, mild tenderness on exam Extr: no edema SVE: CERVIX: 7 cm dilated, 80 effaced, -1 station  EFM: FHR: 150 bpm, variability: moderate,  accelerations:  Present,  decelerations:  Absent Toco: Frequency: Every 4-5 minutes Labs: I have reviewed the patient's lab results.   Assessment & Plan:  03/13/2019 @ [redacted]w[redacted]d, admitted for  Pregnancy and Labor/Delivery Management  1. Pain management: epidural. 2. FWB: FHT category I.  3. ID: GBS positive s/p 3rd dose of penicillin 4. Labor management: continue pitocin titration  All discussed with patient, see orders   [redacted]w[redacted]d, CNM [facility] 11/30/2019  3:03 PM

## 2019-11-30 NOTE — Progress Notes (Signed)
Pt comfortable, one side more numb than the other from epidural Exam unchanged, still 7/80/-1, VTX Ctx every 2-4 min on Pitocin FHT 140s, reactive  Discussed options, cont Pitocin and position changes CS options dicussed as well if no progress  Annamarie Major, MD, [facility], [facility] 11/30/2019  4:41 PM

## 2019-11-30 NOTE — Anesthesia Procedure Notes (Signed)
Epidural Patient location during procedure: OB  Staffing Performed: anesthesiologist   Preanesthetic Checklist Completed: patient identified, IV checked, site marked, risks and benefits discussed, surgical consent, monitors and equipment checked, pre-op evaluation and timeout performed  Epidural Patient position: sitting Prep: Betadine Patient monitoring: heart rate, continuous pulse ox and blood pressure Approach: midline Location: L4-L5 Injection technique: LOR saline  Needle:  Needle type: Tuohy  Needle gauge: 18 G Needle length: 9 cm and 9 Needle insertion depth: 4 cm Catheter type: closed end flexible Catheter size: 20 Guage Catheter at skin depth: 11 cm Test dose: negative and 1.5% lidocaine with Epi 1:200 K  Assessment Sensory level: T10 Events: blood not aspirated, injection not painful, no injection resistance, no paresthesia and negative IV test  Additional Notes   Patient tolerated the insertion well without complications.-SATD -IVTD. No paresthesia. Refer to [facility] nursing for VS and dosingReason for block:procedure for pain

## 2019-11-30 NOTE — Discharge Instructions (Signed)

## 2019-11-30 NOTE — Progress Notes (Signed)
  Labor Progress Note   24 y.o. [id number] @ [redacted]w[redacted]d , admitted for  Pregnancy, Labor Management. IOL, term favorable cervix  Subjective:  Comfortable w epidural  Objective:  BP 102/69   Pulse (!) 116   Temp 98.3 F (36.8 C) (Oral)   Resp 18   Ht 5\' 2"  (1.575 m)   Wt 71.2 kg   LMP 03/11/2019 (Within Weeks)   SpO2 98%   BMI 28.72 kg/m  Abd: gravid, ND, FHT present, mild tenderness on exam Extr: trace to 1+ bilateral pedal edema SVE: 3-5/60/-3  EFM: FHR: 150 bpm, variability: moderate,  accelerations:  Present,  decelerations:  Absent Toco: Frequency: Every 4-74minutes Labs: I have reviewed the patient's lab results.   Assessment & Plan:  11m @ [redacted]w[redacted]d, admitted for  Pregnancy and Labor/Delivery Management  1. Pain management: epidural. 2. FWB: FHT category 1.  3. ID: GBS positive 4. Labor management: Cont Pitocin, active mgt of labor  All discussed with patient, see orders  [redacted]w[redacted]d, MD, [facility], [facility] 11/30/2019  7:29 AM

## 2019-11-30 NOTE — Anesthesia Preprocedure Evaluation (Signed)
Anesthesia Evaluation  Patient identified by MRN, date of birth, ID band Patient awake    Reviewed: Allergy & Precautions, H&P , NPO status , Patient's Chart, lab work & pertinent test results, reviewed documented beta blocker date and time   Airway Mallampati: II  TM Distance: >3 FB Neck ROM: full    Dental no notable dental hx. (+) Teeth Intact   Pulmonary neg pulmonary ROS, Current Smoker,    Pulmonary exam normal breath sounds clear to auscultation       Cardiovascular Exercise Tolerance: Good negative cardio ROS   Rhythm:regular Rate:Normal     Neuro/Psych Anxiety negative neurological ROS  negative psych ROS   GI/Hepatic negative GI ROS, Neg liver ROS,   Endo/Other  negative endocrine ROSdiabetes  Renal/GU      Musculoskeletal   Abdominal   Peds  Hematology  (+) Blood dyscrasia, anemia ,   Anesthesia Other Findings   Reproductive/Obstetrics (+) Pregnancy                             Anesthesia Physical Anesthesia Plan  ASA: II  Anesthesia Plan: Epidural   Post-op Pain Management:    Induction:   PONV Risk Score and Plan:   Airway Management Planned:   Additional Equipment:   Intra-op Plan:   Post-operative Plan:   Informed Consent: I have reviewed the patients History and Physical, chart, labs and discussed the procedure including the risks, benefits and alternatives for the proposed anesthesia with the patient or authorized representative who has indicated his/her understanding and acceptance.       Plan Discussed with:   Anesthesia Plan Comments:         Anesthesia Quick Evaluation

## 2019-11-30 NOTE — Progress Notes (Signed)
Called to room for FHR deceleration to 50s over 2 minutes. Position changes and bolus had already been initiated. Ordered Pitocin turned off. Fetal heart rate return to normal baseline of 115 with good variability and positive accelerations. IV fluid bolus continues and patient in left lateral position. She is comfortable with epidural.   Cervical exam: 4/50/-2, sweep and scalp stimulation done  Pitocin to remain off for a period of 30 minutes prior to resuming IOL  BP (!) 98/59 (BP Location: Left Arm)   Pulse 92   Temp 98.3 F (36.8 C) (Oral)   Resp 18   Ht 5\' 2"  (1.575 m)   Wt 71.2 kg   LMP 03/11/2019 (Within Weeks)   SpO2 96%   BMI 28.72 kg/m    03/13/2019, CNM

## 2019-11-30 NOTE — Progress Notes (Signed)
ANMD notified of Low BP despite position changes and IV fluid Bolus. Pt asymptomatic but order given for IV Ephedrine.

## 2019-11-30 NOTE — Progress Notes (Addendum)
TOC consult due to MOB not having custody of her other children. MOB currently in labor, not appropriate to do CSW assessment yet. CSW left handoff for weekend TOC to follow up to complete assessment and contact CPS after getting assessment info from MOB (per protocol due to MOB not having custody of other children).   Alfonso Ramus, [place] [phone]

## 2019-11-30 NOTE — H&P (Signed)
Date of Initial H&P: 11/29/2019  History reviewed, patient examined, no change in status, stable for induction.   Start PCN for GBS ppx start pitocin

## 2019-12-01 LAB — CBC
HCT: 27.7 % — ABNORMAL LOW (ref 36.0–46.0)
Hemoglobin: 9.3 g/dL — ABNORMAL LOW (ref 12.0–15.0)
MCH: 27.9 pg (ref 26.0–34.0)
MCHC: 33.6 g/dL (ref 30.0–36.0)
MCV: 83.2 fL (ref 80.0–100.0)
Platelets: 157 10*3/uL (ref 150–400)
RBC: 3.33 MIL/uL — ABNORMAL LOW (ref 3.87–5.11)
RDW: 14 % (ref 11.5–15.5)
WBC: 11.9 10*3/uL — ABNORMAL HIGH (ref 4.0–10.5)
nRBC: 0 % (ref 0.0–0.2)

## 2019-12-01 LAB — FETAL SCREEN: Fetal Screen: NEGATIVE

## 2019-12-01 MED ORDER — OXYCODONE-ACETAMINOPHEN 5-325 MG PO TABS: 2.0000 | ORAL_TABLET | ORAL | Status: DC | PRN

## 2019-12-01 MED ORDER — SODIUM CHLORIDE 0.9 % IV SOLN: 250.0000 mL | INTRAVENOUS | Status: DC | PRN

## 2019-12-01 MED ORDER — SODIUM CHLORIDE 0.9% FLUSH
3.0000 mL | Freq: Two times a day (BID) | INTRAVENOUS | Status: DC
  Administered 2019-12-01: 3 mL via INTRAVENOUS

## 2019-12-01 MED ORDER — SIMETHICONE 80 MG PO CHEW
80.0000 mg | CHEWABLE_TABLET | ORAL | Status: DC | PRN
  Administered 2019-12-01 (×2): 80 mg via ORAL
  Filled 2019-12-01 (×2): qty 1

## 2019-12-01 MED ORDER — COCONUT OIL OIL: 1.0000 "application " | TOPICAL_OIL | Status: DC | PRN

## 2019-12-01 MED ORDER — DIBUCAINE (PERIANAL) 1 % EX OINT: 1.0000 "application " | TOPICAL_OINTMENT | CUTANEOUS | Status: DC | PRN

## 2019-12-01 MED ORDER — DIPHENHYDRAMINE HCL 25 MG PO CAPS: 25.0000 mg | ORAL_CAPSULE | Freq: Four times a day (QID) | ORAL | Status: DC | PRN

## 2019-12-01 MED ORDER — SENNOSIDES-DOCUSATE SODIUM 8.6-50 MG PO TABS
2.0000 | ORAL_TABLET | ORAL | Status: DC
  Administered 2019-12-01 – 2019-12-02 (×2): 2 via ORAL
  Filled 2019-12-01 (×2): qty 2

## 2019-12-01 MED ORDER — SODIUM CHLORIDE 0.9% FLUSH: 3.0000 mL | INTRAVENOUS | Status: DC | PRN

## 2019-12-01 MED ORDER — RHO D IMMUNE GLOBULIN 1500 UNIT/2ML IJ SOSY
300.0000 ug | PREFILLED_SYRINGE | Freq: Once | INTRAMUSCULAR | Status: AC
  Administered 2019-12-01: 300 ug via INTRAVENOUS
  Filled 2019-12-01: qty 2

## 2019-12-01 MED ORDER — ZOLPIDEM TARTRATE 5 MG PO TABS: 5.0000 mg | ORAL_TABLET | Freq: Every evening | ORAL | Status: DC | PRN

## 2019-12-01 MED ORDER — WITCH HAZEL-GLYCERIN EX PADS: 1.0000 "application " | MEDICATED_PAD | CUTANEOUS | Status: DC | PRN

## 2019-12-01 MED ORDER — ONDANSETRON HCL 4 MG/2ML IJ SOLN: 4.0000 mg | INTRAMUSCULAR | Status: DC | PRN

## 2019-12-01 MED ORDER — OXYCODONE-ACETAMINOPHEN 5-325 MG PO TABS
1.0000 | ORAL_TABLET | ORAL | Status: DC | PRN
  Administered 2019-12-01 (×2): 1 via ORAL
  Filled 2019-12-01 (×2): qty 1

## 2019-12-01 MED ORDER — ONDANSETRON HCL 4 MG PO TABS: 4.0000 mg | ORAL_TABLET | ORAL | Status: DC | PRN

## 2019-12-01 NOTE — Progress Notes (Signed)
Obstetric Postpartum Daily Progress Note  Chief Complaint: Postpartum care after vaginal delivery  Subjective:  24 y.o. [id number] postpartum day #1 status post vaginal delivery.  She is ambulating, is tolerating po, is voiding spontaneously.  Her pain is well controlled on PO pain medications. Her lochia is less than menses.   Medications SCHEDULED MEDICATIONS  . ibuprofen  600 mg Oral Q6H  . senna-docusate  2 tablet Oral Q24H  . sertraline  50 mg Oral Daily  . sodium chloride flush  3 mL Intravenous Q12H    MEDICATION INFUSIONS  . sodium chloride      PRN MEDICATIONS  sodium chloride flush **AND** sodium chloride flush **AND** sodium chloride, acetaminophen, benzocaine-Menthol, coconut oil, witch hazel-glycerin **AND** dibucaine, diphenhydrAMINE, ondansetron **OR** ondansetron (ZOFRAN) IV, oxyCODONE-acetaminophen, oxyCODONE-acetaminophen, simethicone, zolpidem    Objective:   Vitals:   12/01/19 0352 12/01/19 0801 12/01/19 1122 12/01/19 1221  BP: 105/76 106/71 98/78 105/60  Pulse: (!) 110 85 78 77  Resp: 18 18 18 18   Temp: 97.6 F (36.4 C) 98.8 F (37.1 C) 98.4 F (36.9 C) 98.4 F (36.9 C)  TempSrc: Oral Oral Oral Oral  SpO2: 98% 98%    Weight:      Height:        Current Vital Signs 24h Vital Sign Ranges  T 98.4 F (36.9 C) Temp  Avg: 98.3 F (36.8 C)  Min: 97.6 F (36.4 C)  Max: 98.8 F (37.1 C)  BP 105/60 BP  Min: 82/51  Max: 112/78  HR 77 Pulse  Avg: 101.2  Min: 77  Max: 126  RR 18 Resp  Avg: 17.6  Min: 16  Max: 18  SaO2 98 % Room Air SpO2  Avg: 96.9 %  Min: 94 %  Max: 100 %       24 Hour I/O Current Shift I/O  Time Ins Outs 11/19 0701 - 11/20 0700 In: 384.3 [I.V.:384.3] Out: 1970 [Urine:1820] No intake/output data recorded.  General: NAD Pulmonary: no increased work of breathing Abdomen: non-distended, non-tender, fundus firm at level of umbilicus Extremities: no edema, no erythema, no tenderness  Labs:  Recent Labs  Lab 11/30/19 0016 12/01/19 0644   WBC 10.6* 11.9*  HGB 10.6* 9.3*  HCT 32.5* 27.7*  PLT 199 157     Assessment:   24 y.o. 12/03/19 postpartum day # 1 status post SVD  Plan:   1) Acute blood loss anemia - hemodynamically stable and asymptomatic - encouraged to continue PNV   2) B NEG /  Information for the patient's newborn:  Illyana, Schorsch Cleta Alberts  B POS   / Rubella 3.10 (05/06 1512)/ Varicella Immune  3) TDAP status UTD - 11/06/19  4) bottle /Contraception = oral contraceptives (estrogen/progesterone)   5) Disposition - plan to d/c home tomorrow   11/08/19, Zipporah Plants 12/01/2019 12:54 PM

## 2019-12-01 NOTE — Anesthesia Postprocedure Evaluation (Signed)
Anesthesia Post Note  Patient: Jennifer [name]  Procedure(s) Performed: AN AD HOC LABOR EPIDURAL  Patient location during evaluation: Mother Baby Anesthesia Type: Epidural Level of consciousness: awake and alert Pain management: pain level controlled Vital Signs Assessment: post-procedure vital signs reviewed and stable Respiratory status: spontaneous breathing, nonlabored ventilation and respiratory function stable Cardiovascular status: stable Postop Assessment: no headache, no backache and epidural receding Anesthetic complications: no   No complications documented.   Last Vitals:  Vitals:   12/01/19 0352 12/01/19 0801  BP: 105/76 106/71  Pulse: (!) 110 85  Resp: 18 18  Temp: 36.4 C 37.1 C  SpO2: 98% 98%    Last Pain:  Vitals:   12/01/19 0801  TempSrc: Oral  PainSc:                  Ferne Ellingwood K

## 2019-12-02 LAB — RHOGAM INJECTION: Unit division: 0

## 2019-12-02 MED ORDER — IBUPROFEN 600 MG PO TABS
600.0000 mg | ORAL_TABLET | Freq: Four times a day (QID) | ORAL | 0 refills | Status: DC
Start: 2019-12-02 — End: 2021-07-13

## 2019-12-02 NOTE — Clinical Social Work Maternal (Signed)
CLINICAL SOCIAL WORK MATERNAL/CHILD NOTE  Patient Details  Name: Jennifer [name] MRN: [id number] Date of Birth: 24-Jul-1995  Date:  12/02/2019  Clinical Social Worker Initiating Note:  Jennifer [name], [place] Date/Time: Initiated:  12/02/19/1438     Child's Name:  Jennifer [name] [facility]   Biological Parents:  Mother, Father (Father Sports coach))   Need for Interpreter:  None   Reason for Referral:  Other (Comment) (Patient lost custody of her two other children due to homelessness)   Address:  [place] [address] [place] [address]    Phone number:  [phone] (home)     Additional phone number:   Household Members/Support Persons (HM/SP):   Household Member/Support Person 1   HM/SP Name Relationship DOB or Age  HM/SP -1 Jennifer [name] Mother of father of the baby (grandmother of baby)    HM/SP -2        HM/SP -3        HM/SP -4        HM/SP -5        HM/SP -6        HM/SP -7        HM/SP -8          Natural Supports (not living in the home):  Friends, Immediate Family, Spouse/significant other   Professional Supports: None   Employment: Unemployed   Type of Work:   N/A  Education:  9 to 11 years   Homebound arranged: No  Financial Resources:  [employer]   Other Resources:  Sales executive , [employer]   Cultural/Religious Considerations Which May Impact Care:  N/A  Strengths:  Ability to meet basic needs , Home prepared for child , Compliance with medical plan , Pediatrician chosen   Psychotropic Medications:   Zoloft (patient reports this really helps)    Pediatrician:    [employer]  Pediatrician List: N/A  [facility]    [place] Other ([facility])  [facility]      Pediatrician Fax Number:  [phone]  Risk Factors/Current Problems:  Other(Comment) (Lost custody of 2 older children)   Cognitive State:  Alert , Able to Concentrate    Mood/Affect:  Comfortable , Interested ,  Calm    CSW Assessment: Patient is a 24 year old female that had her daughter, Jennifer [name], on 11/19 @ 10:39am via vaginal delivery. Patient reports that she is living with her boyfriend's mother. Patient reports that she has two older children. She has a son who is 41 years old that lives with her mother and a 36 year old daughter that lives with the father of the current infant. Patient reports that her and the father of the baby do not live together but they are still dating. Patient reports that she receives Food stamps and going to call and sign up for [facility]. Patient reports that her daughter will be going to [place] in [place], [place]. Patient reports that they came by to see her yesterday but she was so sleepy that she does not remember if the set up an appointment but will call tomorrow to obtain an appointment. Patient reports that she lost custody of her 2 older children due to "lack of housing". Patient reports that she lost temporary custody and is working to regain her custody. Patient reports that she is actively working to get her own apartment. Patient reports that she stopped smoking marijuana months ago and is currently  on Zoloft that helps with her anxiety. Patient has a history of anxiety and depression. Patient denies any postpartum depression.   CSW Plan/Description:  No Further Intervention Required/No Barriers to Discharge    Jennifer Grates, LCSW 12/02/2019, 2:41 PM

## 2019-12-02 NOTE — Progress Notes (Signed)
Reviewed D/C instructions with patient who verbalized understanding of teaching.  Pt to schedule f/u appt. Pt stated her partner will bring medications as she is staying the night with her baby.

## 2019-12-04 ENCOUNTER — Encounter: Payer: Medicaid Other | Admitting: Obstetrics

## 2020-04-22 ENCOUNTER — Ambulatory Visit: Payer: Medicaid Other | Admitting: Obstetrics

## 2021-06-01 DIAGNOSIS — J302 Other seasonal allergic rhinitis: Secondary | ICD-10-CM | POA: Insufficient documentation

## 2021-07-13 ENCOUNTER — Encounter: Payer: Self-pay | Admitting: Obstetrics and Gynecology

## 2021-07-13 ENCOUNTER — Ambulatory Visit (INDEPENDENT_AMBULATORY_CARE_PROVIDER_SITE_OTHER): Payer: Medicaid Other

## 2021-07-13 VITALS — BP 100/70 | Ht 62.0 in | Wt 158.0 lb

## 2021-07-13 DIAGNOSIS — O200 Threatened abortion: Secondary | ICD-10-CM

## 2021-07-13 LAB — POCT URINE PREGNANCY: Preg Test, Ur: NEGATIVE

## 2021-07-13 NOTE — Progress Notes (Unsigned)
Subjective:    Jennifer [name] is a 26 y.o. female who presents for evaluation of having two positive home pregnancy tests followed by irregular bleeding since Saturday. She believes she is having a miscarriage.  Last period was abnormal.       Lab Review Urine HCG: negative    Assessment:    Threatened miscarriage     Plan:    Pregnancy Test:  Negative: Discussed with Tresea Mall, CNM. Beta Hcg ordered. Patient advised to report to Emergency Department if she experiences heavy bleeding and is having to change pads more than one time per hour. Lab will result within 24 hours. She is signing up for my chart and will await results.

## 2021-07-14 LAB — BETA HCG QUANT (REF LAB): hCG Quant: 20 m[IU]/mL

## 2021-07-15 ENCOUNTER — Other Ambulatory Visit: Payer: Self-pay | Admitting: Advanced Practice Midwife

## 2021-07-15 ENCOUNTER — Other Ambulatory Visit: Payer: Medicaid Other

## 2021-07-15 DIAGNOSIS — O200 Threatened abortion: Secondary | ICD-10-CM

## 2021-07-15 NOTE — Progress Notes (Unsigned)
Beta Hcg ordered and patient called to advise her to call office for lab-only visit today.

## 2021-07-16 LAB — BETA HCG QUANT (REF LAB): hCG Quant: 4 m[IU]/mL

## 2021-08-10 ENCOUNTER — Ambulatory Visit: Payer: Medicaid Other | Admitting: Advanced Practice Midwife

## 2021-08-27 ENCOUNTER — Ambulatory Visit (INDEPENDENT_AMBULATORY_CARE_PROVIDER_SITE_OTHER): Payer: Medicaid Other

## 2021-08-27 VITALS — BP 92/56 | Ht 62.0 in | Wt 157.0 lb

## 2021-08-27 DIAGNOSIS — N912 Amenorrhea, unspecified: Secondary | ICD-10-CM | POA: Diagnosis not present

## 2021-08-27 LAB — POCT URINE PREGNANCY: Preg Test, Ur: POSITIVE — AB

## 2021-08-27 NOTE — Progress Notes (Signed)
Subjective:    Jennifer [name] is a 26 y.o. female who presents for evaluation of amenorrhea. She believes she could be pregnant. Pregnancy is desired.  Patient had positive pregnancy test 07/13/21 followed by miscarriage on 07/17/21. She reports the bleeding with the miscarriage was normal. She has not had a period since the bleeding with miscarriage. She was given birth control by a [facility] provider 08/19/2021. Patient reports taking 2 days of birth control prior to having a positive home pregnancy test.   Patient's last menstrual period was 07/17/2021 (exact date).    Lab Review Urine HCG: positive    Assessment:    Absence of menstruation.     Plan:    Pregnancy Test:  Positive: EDC: 04/23/2022. Briefly discussed positive results and sent to check out for scheduling of New OB appointments.

## 2021-09-16 ENCOUNTER — Ambulatory Visit (INDEPENDENT_AMBULATORY_CARE_PROVIDER_SITE_OTHER): Payer: Medicaid Other

## 2021-09-16 VITALS — Wt 160.0 lb

## 2021-09-16 DIAGNOSIS — Z8639 Personal history of other endocrine, nutritional and metabolic disease: Secondary | ICD-10-CM

## 2021-09-16 DIAGNOSIS — Z3481 Encounter for supervision of other normal pregnancy, first trimester: Secondary | ICD-10-CM

## 2021-09-16 DIAGNOSIS — Z3480 Encounter for supervision of other normal pregnancy, unspecified trimester: Secondary | ICD-10-CM | POA: Insufficient documentation

## 2021-09-16 DIAGNOSIS — Z369 Encounter for antenatal screening, unspecified: Secondary | ICD-10-CM

## 2021-09-16 NOTE — Progress Notes (Signed)
New OB Intake  I connected with  Jennifer [name] on 09/16/21 at  2:15 PM EDT by telephone and verified that I am speaking with the correct person using two identifiers. Nurse is located at [facility] and pt is located at home.  I explained I am completing New OB Intake today. We discussed her EDD of 04/23/2022 that is based on LMP of 07/17/2021 exact. Pt is G6/P3023. I reviewed her allergies, medications, Medical/Surgical/OB history, and appropriate screenings. Based on history, this is a/an pregnancy uncomplicated .   Patient Active Problem List   Diagnosis Date Noted   Normal labor and delivery 11/30/2019   Positive GBS test 11/08/2019   Supervision of high risk pregnancy, antepartum 05/11/2019   Tobacco use during pregnancy, antepartum 03/07/2018   Iron deficiency anemia 07/05/2017   Rh negative state in antepartum period 05/09/2017   Marijuana use 03/18/2017   ADHD (attention deficit hyperactivity disorder) 11/20/2013   Allergic rhinitis 11/20/2013    Concerns addressed today None  Delivery Plans:  Plans to deliver at [facility].  Anatomy US Explained first scheduled [place] will be around 20 weeks.   Labs Discussed genetic screening with patient. Patient declines genetic testing. Discussed possible labs to be drawn at new OB appointment.  COVID Vaccine Patient has not had COVID vaccine.   Social Determinants of Health Food Insecurity: denies food insecurity Transportation: Patient denies transportation needs. Childcare: Discussed no children allowed at ultrasound appointments.   First visit review I reviewed new OB appt with pt. I explained she will have ob bloodwork and pap smear/pelvic exam if indicated. Explained pt will be seen by Dr. Linzie Collin at first visit; encounter routed to appropriate provider.   Loran Senters, [facility] 09/16/2021  2:41 PM  Clinical Staff Provider  Office Location  [facility] Dating    Language  English Anatomy  US    Flu Vaccine  offer Genetic Screen  NIPS:   TDaP vaccine  offer Hgb A1C or  GTT Early : Third trimester :   Covid declines   LAB RESULTS   Rhogam   Blood Type     Feeding Plan formula Antibody    Contraception pills Rubella    Circumcision yes RPR     Pediatrician  [facility]     Support Person Harrold Donath HIV    Prenatal Classes no Varicella     GBS  (For PCN allergy, check sensitivities)   BTL Consent  Hep C   VBAC Consent  Pap      Hgb Electro      CF      SMA

## 2021-09-25 ENCOUNTER — Other Ambulatory Visit: Payer: Medicaid Other

## 2021-09-25 DIAGNOSIS — Z3481 Encounter for supervision of other normal pregnancy, first trimester: Secondary | ICD-10-CM

## 2021-09-25 DIAGNOSIS — Z369 Encounter for antenatal screening, unspecified: Secondary | ICD-10-CM

## 2021-09-25 LAB — OB RESULTS CONSOLE VARICELLA ZOSTER ANTIBODY, IGG: Varicella: IMMUNE

## 2021-09-26 LAB — CBC/D/PLT+RPR+RH+ABO+RUBIGG...
Antibody Screen: NEGATIVE
Basophils Absolute: 0 10*3/uL (ref 0.0–0.2)
Basos: 0 %
EOS (ABSOLUTE): 0.1 10*3/uL (ref 0.0–0.4)
Eos: 1 %
HCV Ab: NONREACTIVE
HIV Screen 4th Generation wRfx: NONREACTIVE
Hematocrit: 37 % (ref 34.0–46.6)
Hemoglobin: 12.2 g/dL (ref 11.1–15.9)
Hepatitis B Surface Ag: NEGATIVE
Immature Grans (Abs): 0 10*3/uL (ref 0.0–0.1)
Immature Granulocytes: 0 %
Lymphocytes Absolute: 2 10*3/uL (ref 0.7–3.1)
Lymphs: 26 %
MCH: 28.8 pg (ref 26.6–33.0)
MCHC: 33 g/dL (ref 31.5–35.7)
MCV: 88 fL (ref 79–97)
Monocytes Absolute: 0.6 10*3/uL (ref 0.1–0.9)
Monocytes: 8 %
Neutrophils Absolute: 4.9 10*3/uL (ref 1.4–7.0)
Neutrophils: 65 %
Platelets: 213 10*3/uL (ref 150–450)
RBC: 4.23 x10E6/uL (ref 3.77–5.28)
RDW: 12.3 % (ref 11.7–15.4)
RPR Ser Ql: NONREACTIVE
Rh Factor: NEGATIVE
Rubella Antibodies, IGG: 1.86 index (ref >0.99)
Varicella zoster IgG: 322 index (ref >165)
WBC: 7.7 10*3/uL (ref 3.4–10.8)

## 2021-09-26 LAB — HCV INTERPRETATION

## 2021-10-05 ENCOUNTER — Other Ambulatory Visit (HOSPITAL_COMMUNITY)
Admission: RE | Admit: 2021-10-05 | Discharge: 2021-10-05 | Disposition: A | Discharge status: Home / Self Care | Payer: Medicaid Other | Source: Ambulatory Visit | Attending: Obstetrics & Gynecology | Admitting: Obstetrics & Gynecology

## 2021-10-05 ENCOUNTER — Encounter: Payer: Self-pay | Admitting: Obstetrics & Gynecology

## 2021-10-05 ENCOUNTER — Ambulatory Visit (INDEPENDENT_AMBULATORY_CARE_PROVIDER_SITE_OTHER): Payer: Medicaid Other | Admitting: Obstetrics & Gynecology

## 2021-10-05 VITALS — BP 110/70 | HR 101 | Wt 146.0 lb

## 2021-10-05 DIAGNOSIS — Z3A11 11 weeks gestation of pregnancy: Secondary | ICD-10-CM

## 2021-10-05 DIAGNOSIS — N898 Other specified noninflammatory disorders of vagina: Secondary | ICD-10-CM | POA: Diagnosis not present

## 2021-10-05 DIAGNOSIS — Z3481 Encounter for supervision of other normal pregnancy, first trimester: Secondary | ICD-10-CM

## 2021-10-05 DIAGNOSIS — F419 Anxiety disorder, unspecified: Secondary | ICD-10-CM

## 2021-10-05 LAB — POCT URINALYSIS DIPSTICK OB: Glucose, UA: NEGATIVE

## 2021-10-05 MED ORDER — HYDROXYZINE HCL 10 MG PO TABS: 10.0000 mg | ORAL_TABLET | Freq: Three times a day (TID) | ORAL | 0 refills | Status: DC | PRN

## 2021-10-05 NOTE — Progress Notes (Signed)
Subjective:  bstetr  Jennifer [name] is being seen today for her first  visit.  This is a planned pregnancy. She is at [redacted]w[redacted]d gestation. Her obstetrical history is significant for marjuana smoker, attempting to quit smoking . Pt c/o feeling anxious and previously smoking marijuana to calm herself Relationship with FOB: significant other, living together. Patient  undecided about   intent to breast feed. Pregnancy history fully reviewed.  Menstrual History: OB History     Gravida  6   Para  3   Term  3   Preterm      AB  2   Living  3      SAB  1   IAB  1   Ectopic      Multiple  0   Live Births  3        Obstetric Comments  IOL for postdates with G1         Menarche age: 42 Patient's last menstrual period was 07/17/2021 (exact date).    The following portions of the patient's history were reviewed and updated as appropriate: allergies, current medications, past family history, past medical history, past social history, past surgical history, and problem list.  Review of Systems A comprehensive review of systems was negative.    Objective:    BP 110/70   Pulse (!) 101   Wt 146 lb (66.2 kg)   LMP 07/17/2021 (Exact Date)   BMI 26.70 kg/m  General appearance: alert, cooperative, no distress, mild distress, moderate distress, and severe distress Breasts: normal appearance, no masses or tenderness Heart: regular rate and rhythm Abdomen: normal findings: no organomegaly and soft, non-tender Pelvic: cervix normal in appearance, external genitalia normal, no adnexal masses or tenderness, no cervical motion tenderness, uterus normal size, shape, and consistency, and vagina normal without discharge Extremities: extremities normal, atraumatic, no cyanosis or edema    Assessment:    Pregnancy at 11 and 3/7 weeks    Plan:    Initial labs drawn. Prenatal vitamins. Problem list reviewed and updated. AFP3 discussed:  not discussed . Role of ultrasound in  pregnancy discussed; fetal survey:  discussed . Follow up in 4 weeks.

## 2021-10-07 LAB — CERVICOVAGINAL ANCILLARY ONLY
Bacterial Vaginitis (gardnerella): POSITIVE — AB
Candida Glabrata: NEGATIVE
Candida Vaginitis: POSITIVE — AB
Comment: NEGATIVE
Comment: NEGATIVE
Comment: NEGATIVE

## 2021-10-13 ENCOUNTER — Other Ambulatory Visit: Payer: Self-pay

## 2021-10-13 DIAGNOSIS — B9689 Other specified bacterial agents as the cause of diseases classified elsewhere: Secondary | ICD-10-CM

## 2021-10-13 MED ORDER — METRONIDAZOLE 500 MG PO TABS: 500.0000 mg | ORAL_TABLET | Freq: Two times a day (BID) | ORAL | 0 refills | Status: DC

## 2021-10-14 ENCOUNTER — Telehealth: Payer: Self-pay

## 2021-10-14 NOTE — Telephone Encounter (Signed)
Pt calling reporting a H/A states she has already took tylenol and Excedrin with no relief also states she is drinking plenty of water. Wants to know if something can be sent in?

## 2021-10-15 ENCOUNTER — Ambulatory Visit
Admission: RE | Admit: 2021-10-15 | Discharge: 2021-10-15 | Disposition: A | Discharge status: Home / Self Care | Payer: Medicaid Other | Source: Ambulatory Visit | Attending: Obstetrics & Gynecology | Admitting: Obstetrics & Gynecology

## 2021-10-15 DIAGNOSIS — Z3A12 12 weeks gestation of pregnancy: Secondary | ICD-10-CM | POA: Insufficient documentation

## 2021-10-15 DIAGNOSIS — Z3687 Encounter for antenatal screening for uncertain dates: Secondary | ICD-10-CM | POA: Insufficient documentation

## 2021-10-15 DIAGNOSIS — Z3481 Encounter for supervision of other normal pregnancy, first trimester: Secondary | ICD-10-CM

## 2021-10-16 ENCOUNTER — Other Ambulatory Visit: Payer: Self-pay | Admitting: Licensed Practical Nurse

## 2021-10-16 DIAGNOSIS — O26891 Other specified pregnancy related conditions, first trimester: Secondary | ICD-10-CM

## 2021-10-16 MED ORDER — BUTALBITAL-APAP-CAFFEINE 50-325-40 MG PO TABS: 1.0000 | ORAL_TABLET | Freq: Four times a day (QID) | ORAL | 0 refills | Status: DC | PRN

## 2021-10-16 NOTE — Telephone Encounter (Signed)
Pt aware you will send in rx

## 2021-10-16 NOTE — Telephone Encounter (Signed)
Pt is still having h/a

## 2021-10-16 NOTE — Progress Notes (Signed)
Pt called and spoke with Janett Billow CMA and reported HA x a few days not responsive to Tylenol or Excedrin. Reviewed treatment and allergies with Dr Marcelline Mates Script for Fioricet sent Roberto Scales, [facility]  10/16/21  1:17 PM

## 2021-11-02 ENCOUNTER — Ambulatory Visit (INDEPENDENT_AMBULATORY_CARE_PROVIDER_SITE_OTHER): Payer: Medicaid Other | Admitting: Licensed Practical Nurse

## 2021-11-02 ENCOUNTER — Encounter: Payer: Self-pay | Admitting: Licensed Practical Nurse

## 2021-11-02 VITALS — BP 108/69 | HR 95 | Wt 146.5 lb

## 2021-11-02 DIAGNOSIS — Z3480 Encounter for supervision of other normal pregnancy, unspecified trimester: Secondary | ICD-10-CM

## 2021-11-02 DIAGNOSIS — F17210 Nicotine dependence, cigarettes, uncomplicated: Secondary | ICD-10-CM

## 2021-11-02 DIAGNOSIS — D509 Iron deficiency anemia, unspecified: Secondary | ICD-10-CM

## 2021-11-02 DIAGNOSIS — O99322 Drug use complicating pregnancy, second trimester: Secondary | ICD-10-CM

## 2021-11-02 DIAGNOSIS — O99342 Other mental disorders complicating pregnancy, second trimester: Secondary | ICD-10-CM

## 2021-11-02 DIAGNOSIS — O99820 Streptococcus B carrier state complicating pregnancy: Secondary | ICD-10-CM

## 2021-11-02 DIAGNOSIS — O360120 Maternal care for anti-D [Rh] antibodies, second trimester, not applicable or unspecified: Secondary | ICD-10-CM

## 2021-11-02 DIAGNOSIS — F909 Attention-deficit hyperactivity disorder, unspecified type: Secondary | ICD-10-CM

## 2021-11-02 DIAGNOSIS — O99012 Anemia complicating pregnancy, second trimester: Secondary | ICD-10-CM

## 2021-11-02 DIAGNOSIS — F419 Anxiety disorder, unspecified: Secondary | ICD-10-CM

## 2021-11-02 DIAGNOSIS — F1210 Cannabis abuse, uncomplicated: Secondary | ICD-10-CM

## 2021-11-02 DIAGNOSIS — Z3A15 15 weeks gestation of pregnancy: Secondary | ICD-10-CM

## 2021-11-02 DIAGNOSIS — O99332 Smoking (tobacco) complicating pregnancy, second trimester: Secondary | ICD-10-CM

## 2021-11-02 LAB — POCT URINALYSIS DIPSTICK OB
Bilirubin, UA: NEGATIVE
Blood, UA: NEGATIVE
Glucose, UA: NEGATIVE
Ketones, UA: NEGATIVE
Leukocytes, UA: NEGATIVE
Nitrite, UA: NEGATIVE
Spec Grav, UA: <=1.005 — AB (ref 1.010–1.025)
Urobilinogen, UA: 0.2 E.U./dL
pH, UA: 8.5 — AB (ref 5.0–8.0)

## 2021-11-02 MED ORDER — SERTRALINE HCL 50 MG PO TABS: 50.0000 mg | ORAL_TABLET | Freq: Every day | ORAL | 2 refills | Status: DC

## 2021-11-02 NOTE — Progress Notes (Unsigned)
Routine Prenatal Care Visit  Subjective  Jennifer [name] is a 26 y.o. [phone] at [redacted]w[redacted]d being seen today for ongoing prenatal care.  She is currently monitored for the following issues for this {Blank single:19197::"high-risk","low-risk"} pregnancy and has ADHD (attention deficit hyperactivity disorder); Allergic rhinitis; Marijuana use; Rh negative state in antepartum period; Iron deficiency anemia; Tobacco use during pregnancy, antepartum; Supervision of high risk pregnancy, antepartum; Positive GBS test; Normal labor and delivery; and Supervision of other normal pregnancy, antepartum on their problem list.  ----------------------------------------------------------------------------------- Patient reports {sx:14538}.   Contractions: Not present. Vag. Bleeding: None.  Movement: Present. Leaking Fluid {Actions; denies/reports/admits to:19208}.  ----------------------------------------------------------------------------------- The following portions of the patient's history were reviewed and updated as appropriate: allergies, current medications, past family history, past medical history, past social history, past surgical history and problem list. Problem list updated.  Objective  Blood pressure 108/69, pulse 95, weight 146 lb 8 oz (66.5 kg), last menstrual period 07/17/2021, not currently breastfeeding. Pregravid weight 152 lb (68.9 kg) Total Weight Gain -5 lb 8 oz (-2.495 kg) Urinalysis: Urine Protein    Urine Glucose    Fetal Status:     Movement: Present     General:  Alert, oriented and cooperative. Patient is in no acute distress.  Skin: Skin is warm and dry. No rash noted.   Cardiovascular: Normal heart rate noted  Respiratory: Normal respiratory effort, no problems with respiration noted  Abdomen: Soft, gravid, appropriate for gestational age. Pain/Pressure: Present     Pelvic:  {Blank single:19197::"Cervical exam performed","Cervical exam deferred"}        Extremities: Normal range of  motion.     Mental Status: Normal mood and affect. Normal behavior. Normal judgment and thought content.   Assessment   26 y.o. [id number] at [redacted]w[redacted]d by  04/23/2022, by Last Menstrual Period presenting for {Blank single:19197::"routine","work-in"} prenatal visit  Plan   sixth Problems (from 09/16/21 to present)    No problems associated with this episode.       {Blank single:19197::"Term","Preterm"} labor symptoms and general obstetric precautions including but not limited to vaginal bleeding, contractions, leaking of fluid and fetal movement were reviewed in detail with the patient. Please refer to After Visit Summary for other counseling recommendations.   Return in about 4 weeks (around 11/30/2021) for ROB.  @MYSIGNATURE @

## 2021-11-19 ENCOUNTER — Encounter: Payer: Self-pay | Admitting: Oncology

## 2021-11-25 ENCOUNTER — Encounter: Payer: Self-pay | Admitting: Emergency Medicine

## 2021-11-25 ENCOUNTER — Other Ambulatory Visit: Payer: Self-pay

## 2021-11-25 ENCOUNTER — Emergency Department
Admission: EM | Admit: 2021-11-25 | Discharge: 2021-11-26 | Disposition: A | Discharge status: Home / Self Care | Payer: Medicaid Other | Attending: Emergency Medicine | Admitting: Emergency Medicine

## 2021-11-25 DIAGNOSIS — Z3A15 15 weeks gestation of pregnancy: Secondary | ICD-10-CM | POA: Insufficient documentation

## 2021-11-25 DIAGNOSIS — W19XXXA Unspecified fall, initial encounter: Secondary | ICD-10-CM

## 2021-11-25 DIAGNOSIS — W01110A Fall on same level from slipping, tripping and stumbling with subsequent striking against sharp glass, initial encounter: Secondary | ICD-10-CM | POA: Diagnosis not present

## 2021-11-25 DIAGNOSIS — O9A212 Injury, poisoning and certain other consequences of external causes complicating pregnancy, second trimester: Secondary | ICD-10-CM | POA: Diagnosis not present

## 2021-11-25 DIAGNOSIS — S81811A Laceration without foreign body, right lower leg, initial encounter: Secondary | ICD-10-CM | POA: Diagnosis not present

## 2021-11-25 DIAGNOSIS — O26892 Other specified pregnancy related conditions, second trimester: Secondary | ICD-10-CM | POA: Diagnosis present

## 2021-11-25 LAB — URINALYSIS, ROUTINE W REFLEX MICROSCOPIC
Bacteria, UA: NONE SEEN
Bilirubin Urine: NEGATIVE
Glucose, UA: NEGATIVE mg/dL
Hgb urine dipstick: NEGATIVE
Ketones, ur: 5 mg/dL — AB
Leukocytes,Ua: NEGATIVE
Nitrite: NEGATIVE
Protein, ur: 30 mg/dL — AB
Specific Gravity, Urine: 1.027 (ref 1.005–1.030)
pH: 5 (ref 5.0–8.0)

## 2021-11-25 LAB — CBC WITH DIFFERENTIAL/PLATELET
Abs Immature Granulocytes: 0.1 10*3/uL — ABNORMAL HIGH (ref 0.00–0.07)
Basophils Absolute: 0 10*3/uL (ref 0.0–0.1)
Basophils Relative: 0 %
Eosinophils Absolute: 0.1 10*3/uL (ref 0.0–0.5)
Eosinophils Relative: 1 %
HCT: 30.5 % — ABNORMAL LOW (ref 36.0–46.0)
Hemoglobin: 10.2 g/dL — ABNORMAL LOW (ref 12.0–15.0)
Immature Granulocytes: 1 %
Lymphocytes Relative: 9 %
Lymphs Abs: 1.5 10*3/uL (ref 0.7–4.0)
MCH: 28.7 pg (ref 26.0–34.0)
MCHC: 33.4 g/dL (ref 30.0–36.0)
MCV: 85.9 fL (ref 80.0–100.0)
Monocytes Absolute: 0.6 10*3/uL (ref 0.1–1.0)
Monocytes Relative: 4 %
Neutro Abs: 14.2 10*3/uL — ABNORMAL HIGH (ref 1.7–7.7)
Neutrophils Relative %: 85 %
Platelets: 360 10*3/uL (ref 150–400)
RBC: 3.55 MIL/uL — ABNORMAL LOW (ref 3.87–5.11)
RDW: 12.5 % (ref 11.5–15.5)
WBC: 16.5 10*3/uL — ABNORMAL HIGH (ref 4.0–10.5)
nRBC: 0 % (ref 0.0–0.2)

## 2021-11-25 LAB — COMPREHENSIVE METABOLIC PANEL
ALT: 16 U/L (ref 0–44)
AST: 15 U/L (ref 15–41)
Albumin: 3 g/dL — ABNORMAL LOW (ref 3.5–5.0)
Alkaline Phosphatase: 47 U/L (ref 38–126)
Anion gap: 6 (ref 5–15)
BUN: 13 mg/dL (ref 6–20)
CO2: 23 mmol/L (ref 22–32)
Calcium: 8.1 mg/dL — ABNORMAL LOW (ref 8.9–10.3)
Chloride: 107 mmol/L (ref 98–111)
Creatinine, Ser: 0.47 mg/dL (ref 0.44–1.00)
GFR, Estimated: >60 mL/min (ref >60)
Glucose, Bld: 104 mg/dL — ABNORMAL HIGH (ref 70–99)
Potassium: 3.2 mmol/L — ABNORMAL LOW (ref 3.5–5.1)
Sodium: 136 mmol/L (ref 135–145)
Total Bilirubin: 0.2 mg/dL — ABNORMAL LOW (ref 0.3–1.2)
Total Protein: 6.3 g/dL — ABNORMAL LOW (ref 6.5–8.1)

## 2021-11-25 MED ORDER — CEPHALEXIN 500 MG PO CAPS: 500.0000 mg | ORAL_CAPSULE | Freq: Four times a day (QID) | ORAL | 0 refills | Status: AC

## 2021-11-25 NOTE — Discharge Instructions (Addendum)
Take Keflex four times daily for the next seven days.  

## 2021-11-25 NOTE — ED Triage Notes (Signed)
Patient ambulatory to triage with steady gait, without difficulty or distress noted; pt reports that she fell and hit a glass table PTA; laceration to rt knee

## 2021-11-25 NOTE — ED Notes (Signed)
Patient made aware of need for urine specimen; states, "I am drinking water."  Labeled specimen cup provided to patient; advised to let us know when she is able to provide sample.  Expresses verbal understanding.

## 2021-11-25 NOTE — ED Provider Notes (Signed)
[facility] Provider Note  Patient Contact: 11:20 PM (approximate)   History   Laceration   HPI  Jennifer [name] is a 26 y.o. female presents to the emergency department with a 4 cm x 2 cm laceration along right lower leg after patient supposedly tripped and fell against a glass table.  Patient is a G4, P3 currently [redacted] weeks pregnant.  She states that she had some mild abdominal tightness after her fall but no vaginal bleeding or persistent pain.      Physical Exam   Triage Vital Signs: ED Triage Vitals  Enc Vitals Group     BP 11/25/21 2147 109/73     Pulse Rate 11/25/21 2147 (!) 129     Resp 11/25/21 2147 18     Temp 11/25/21 2147 98.4 F (36.9 C)     Temp Source 11/25/21 2147 Oral     SpO2 11/25/21 2147 98 %     Weight 11/25/21 2144 150 lb (68 kg)     Height 11/25/21 2144 5\' 2"  (1.575 m)     Head Circumference --      Peak Flow --      Pain Score 11/25/21 2144 5     Pain Loc --      Pain Edu? --      Excl. in GC? --     Most recent vital signs: Vitals:   11/25/21 2147  BP: 109/73  Pulse: (!) 129  Resp: 18  Temp: 98.4 F (36.9 C)  SpO2: 98%     General: Alert and in no acute distress. Eyes:  PERRL. EOMI. Head: No acute traumatic findings ENT:      Nose: No congestion/rhinnorhea.      Mouth/Throat: Mucous membranes are moist. Neck: No stridor. No cervical spine tenderness to palpation. Cardiovascular:  Good peripheral perfusion Respiratory: Normal respiratory effort without tachypnea or retractions. Lungs CTAB. Good air entry to the bases with no decreased or absent breath sounds. Gastrointestinal: Bowel sounds 4 quadrants. Soft and nontender to palpation. No guarding or rigidity. No palpable masses. No distention. No CVA tenderness. Musculoskeletal: Full range of motion to all extremities.  Neurologic:  No gross focal neurologic deficits are appreciated.  Skin: Patient has a 4 cm x 2 cm avulsion along the anterior aspect of  the right calf. Other:   ED Results / Procedures / Treatments   Labs (all labs ordered are listed, but only abnormal results are displayed) Labs Reviewed  CBC WITH DIFFERENTIAL/PLATELET - Abnormal; Notable for the following components:      Result Value   WBC 16.5 (*)    RBC 3.55 (*)    Hemoglobin 10.2 (*)    HCT 30.5 (*)    Neutro Abs 14.2 (*)    Abs Immature Granulocytes 0.10 (*)    All other components within normal limits  COMPREHENSIVE METABOLIC PANEL - Abnormal; Notable for the following components:   Potassium 3.2 (*)    Glucose, Bld 104 (*)    Calcium 8.1 (*)    Total Protein 6.3 (*)    Albumin 3.0 (*)    Total Bilirubin 0.2 (*)    All other components within normal limits  URINALYSIS, ROUTINE W REFLEX MICROSCOPIC          PROCEDURES:  Critical Care performed: No  Procedures   MEDICATIONS ORDERED IN ED: Medications - No data to display   IMPRESSION / MDM / ASSESSMENT AND PLAN / ED COURSE  I reviewed the  triage vital signs and the nursing notes.                              Assessment and plan Laceration Fall 26 year old female presents to the emergency department after patient had a mechanical fall resulting in a right lower leg laceration.  Patient was tachycardic at triage but vital signs were otherwise reassuring.  On exam, patient was alert, active and nontoxic-appearing.  Bedside fetal ultrasound was conducted and fetal heart rate was 145 bpm. Fetal ultrasound conducted by attending, Dr. Sidney Ace. We will treat patient with Keflex 4 times daily for the next 7 days.  Patient education regarding wound care was given. Return precautions were given to return with new or worsening symptoms.      FINAL CLINICAL IMPRESSION(S) / ED DIAGNOSES   Final diagnoses:  Fall, initial encounter     Rx / DC Orders   ED Discharge Orders          Ordered    cephALEXin (KEFLEX) 500 MG capsule  4 times daily        11/25/21 2328             Note:   This document was prepared using Dragon voice recognition software and may include unintentional dictation errors.   Pia Mau [place], PA-C 11/25/21 2332    Georga Hacking, MD 11/28/21 [phone]

## 2021-11-26 ENCOUNTER — Ambulatory Visit: Payer: Medicaid Other | Admitting: Psychiatry

## 2021-11-26 MED ORDER — ACETAMINOPHEN 325 MG PO TABS
650.0000 mg | ORAL_TABLET | Freq: Once | ORAL | Status: AC
  Administered 2021-11-26: 650 mg via ORAL
  Filled 2021-11-26: qty 2

## 2021-11-27 ENCOUNTER — Other Ambulatory Visit: Payer: Self-pay | Admitting: Licensed Practical Nurse

## 2021-11-27 DIAGNOSIS — Z3480 Encounter for supervision of other normal pregnancy, unspecified trimester: Secondary | ICD-10-CM

## 2021-11-27 DIAGNOSIS — F419 Anxiety disorder, unspecified: Secondary | ICD-10-CM

## 2021-12-07 ENCOUNTER — Other Ambulatory Visit: Payer: Medicaid Other

## 2021-12-08 ENCOUNTER — Ambulatory Visit
Admission: RE | Admit: 2021-12-08 | Discharge: 2021-12-08 | Disposition: A | Discharge status: Home / Self Care | Payer: Medicaid Other | Source: Ambulatory Visit | Attending: Licensed Practical Nurse | Admitting: Licensed Practical Nurse

## 2021-12-08 DIAGNOSIS — Z3480 Encounter for supervision of other normal pregnancy, unspecified trimester: Secondary | ICD-10-CM | POA: Insufficient documentation

## 2021-12-08 DIAGNOSIS — O321XX0 Maternal care for breech presentation, not applicable or unspecified: Secondary | ICD-10-CM | POA: Diagnosis not present

## 2021-12-08 DIAGNOSIS — Z3A20 20 weeks gestation of pregnancy: Secondary | ICD-10-CM | POA: Diagnosis not present

## 2021-12-09 ENCOUNTER — Ambulatory Visit (INDEPENDENT_AMBULATORY_CARE_PROVIDER_SITE_OTHER): Payer: Medicaid Other | Admitting: Obstetrics

## 2021-12-09 VITALS — BP 104/69 | HR 90 | Wt 150.0 lb

## 2021-12-09 DIAGNOSIS — Z3482 Encounter for supervision of other normal pregnancy, second trimester: Secondary | ICD-10-CM

## 2021-12-09 DIAGNOSIS — Z3A20 20 weeks gestation of pregnancy: Secondary | ICD-10-CM

## 2021-12-09 MED ORDER — OMEPRAZOLE 20 MG PO CPDR: 20.0000 mg | DELAYED_RELEASE_CAPSULE | Freq: Two times a day (BID) | ORAL | 1 refills | Status: DC

## 2021-12-09 NOTE — Progress Notes (Signed)
ROB at [redacted]w[redacted]d. Good fetal movement. Anatomy US yesterday WNL. Jennifer [name] is feeling well overall but is tired. She is waking up at night from heartburn. Discussed comfort measures. Rx for omeprazole sent to pharmacy. She reports frequent breakouts on her tongue from various foods (mostly fruits, citrus, tomato, etc.). Desire allergy testing. She will check with her PCP. She would like MaterniT 21 done today. RTC in 4 weeks.  Jennifer [name] Spanish, CNM

## 2021-12-31 ENCOUNTER — Other Ambulatory Visit: Payer: Self-pay | Admitting: Obstetrics

## 2022-01-06 ENCOUNTER — Encounter: Payer: Self-pay | Admitting: Certified Nurse Midwife

## 2022-01-06 ENCOUNTER — Ambulatory Visit (INDEPENDENT_AMBULATORY_CARE_PROVIDER_SITE_OTHER): Payer: Medicaid Other | Admitting: Certified Nurse Midwife

## 2022-01-06 ENCOUNTER — Ambulatory Visit: Payer: Medicaid Other

## 2022-01-06 VITALS — BP 103/67 | HR 109 | Ht 62.0 in | Wt 153.0 lb

## 2022-01-06 VITALS — BP 103/67 | HR 109 | Wt 153.2 lb

## 2022-01-06 DIAGNOSIS — O368390 Maternal care for abnormalities of the fetal heart rate or rhythm, unspecified trimester, not applicable or unspecified: Secondary | ICD-10-CM | POA: Insufficient documentation

## 2022-01-06 DIAGNOSIS — Z3A24 24 weeks gestation of pregnancy: Secondary | ICD-10-CM

## 2022-01-06 DIAGNOSIS — Z3480 Encounter for supervision of other normal pregnancy, unspecified trimester: Secondary | ICD-10-CM

## 2022-01-06 NOTE — Progress Notes (Signed)
    NURSE VISIT NOTE  Subjective:    Patient ID: Rod Can, female    DOB: Nov 27, 1995, 26 y.o.   MRN: [id number]  HPI  Patient is a 26 y.o. [phone] female who presents for fetal monitoring per order from Doreene Burke, CNM.   Objective:    BP 103/67   Pulse (!) 109   Ht 5\' 2"  (1.575 m)   Wt 153 lb (69.4 kg)   LMP 07/17/2021 (Exact Date)   BMI 27.98 kg/m  Estimated Date of Delivery: 04/23/22  Assessment:   1. Fetal arrhythmia affecting pregnancy, antepartum   2. Supervision of other normal pregnancy, antepartum   3. [redacted] weeks gestation of pregnancy      Plan:   Results reviewed and discussed with patient by  06/23/22, CNM.     Doreene Burke, LPN n

## 2022-01-06 NOTE — Patient Instructions (Signed)
Oral Glucose Tolerance Test During Pregnancy Why am I having this test? The oral glucose tolerance test (OGTT) is done to check how your body processes blood sugar (glucose). This is one of several tests used to diagnose diabetes that develops during pregnancy (gestational diabetes mellitus). Gestational diabetes is a short-term form of diabetes that some women develop while they are pregnant. It usually occurs during the second trimester of pregnancy and goes away after delivery. Testing, or screening, for gestational diabetes usually occurs at weeks 24-28 of pregnancy. You may have the OGTT test after having a 1-hour glucose screening test if the results from that test indicate that you may have gestational diabetes. This test may also be needed if: You have a history of gestational diabetes. There is a history of giving birth to very large babies or of losing pregnancies (having stillbirths). You have signs and symptoms of diabetes, such as: Changes in your eyesight. Tingling or numbness in your hands or feet. Changes in hunger, thirst, and urination, and these are not explained by your pregnancy. What is being tested? This test measures the amount of glucose in your blood at different times during a period of 3 hours. This shows how well your body can process glucose. What kind of sample is taken?  Blood samples are required for this test. They are usually collected by inserting a needle into a blood vessel. How do I prepare for this test? For 3 days before your test, eat normally. Have plenty of carbohydrate-rich foods. Follow instructions from your health care provider about: Eating or drinking restrictions on the day of the test. You may be asked not to eat or drink anything other than water (to fast) starting 8-10 hours before the test. Changing or stopping your regular medicines. Some medicines may interfere with this test. Tell a health care provider about: All medicines you are  taking, including vitamins, herbs, eye drops, creams, and over-the-counter medicines. Any blood disorders you have. Any surgeries you have had. Any medical conditions you have. What happens during the test? First, your blood glucose will be measured. This is referred to as your fasting blood glucose because you fasted before the test. Then, you will drink a glucose solution that contains a certain amount of glucose. Your blood glucose will be measured again 1, 2, and 3 hours after you drink the solution. This test takes about 3 hours to complete. You will need to stay at the testing location during this time. During the testing period: Do not eat or drink anything other than the glucose solution. Do not exercise. Do not use any products that contain nicotine or tobacco, such as cigarettes, e-cigarettes, and chewing tobacco. These can affect your test results. If you need help quitting, ask your health care provider. The testing procedure may vary among health care providers and hospitals. How are the results reported? Your results will be reported as milligrams of glucose per deciliter of blood (mg/dL) or millimoles per liter (mmol/L). There is more than one source for screening and diagnosis reference values used to diagnose gestational diabetes. Your health care provider will compare your results to normal values that were established after testing a large group of people (reference values). Reference values may vary among labs and hospitals. For this test (Carpenter-Coustan), reference values are: Fasting: 95 mg/dL (5.3 mmol/L). 1 hour: 180 mg/dL (10.0 mmol/L). 2 hour: 155 mg/dL (8.6 mmol/L). 3 hour: 140 mg/dL (7.8 mmol/L). What do the results mean? Results below the reference values are   considered normal. If two or more of your blood glucose levels are at or above the reference values, you may be diagnosed with gestational diabetes. If only one level is high, your health care provider may  suggest repeat testing or other tests to confirm a diagnosis. Talk with your health care provider about what your results mean. Questions to ask your health care provider Ask your health care provider, or the department that is doing the test: When will my results be ready? How will I get my results? What are my treatment options? What other tests do I need? What are my next steps? Summary The oral glucose tolerance test (OGTT) is one of several tests used to diagnose diabetes that develops during pregnancy (gestational diabetes mellitus). Gestational diabetes is a short-term form of diabetes that some women develop while they are pregnant. You may have the OGTT test after having a 1-hour glucose screening test if the results from that test show that you may have gestational diabetes. You may also have this test if you have any symptoms or risk factors for this type of diabetes. Talk with your health care provider about what your results mean. This information is not intended to replace advice given to you by your health care provider. Make sure you discuss any questions you have with your health care provider. Document Revised: 08/04/2021 Document Reviewed: 06/07/2019 Elsevier Patient Education  2023 Elsevier Inc.  

## 2022-01-06 NOTE — Progress Notes (Signed)
ROB, pt state she has been sick since [place]. She state her children were sick and were positive for the flu. She was feeling achy, chills, and fatigued. She has not  taken anything to treat. She is starting to feel better. Discussed over the counter meds for symptom treatments. Med list given. ON auscultation to [facility] arrhythmia noted. Placed on fetal monitor. Strip reviewed with DR. Cherry. Discussed with pt arrhythmia is a common occurrence. Discussed referral to MFM should arrhythmia be present at 28 wks. She verbalizes and agrees to plan.  Reviewed glucose screen next visit.   FHT 140 , moderate variability .

## 2022-01-06 NOTE — Patient Instructions (Signed)

## 2022-01-07 ENCOUNTER — Encounter: Payer: Self-pay | Admitting: Certified Nurse Midwife

## 2022-01-11 NOTE — L&D Delivery Note (Signed)
Obstetrical Delivery Note   Date of Delivery:   04/27/2022 Primary OB:   [facility]  Gestational Age/EDD: [redacted]w[redacted]d (Dated by 12wkUS) Reason for Admission: IOL, postdates Antepartum complications: none  Delivered By:   Siri Cole, CNM   Delivery Type:   spontaneous vaginal delivery  Delivery Details:   Pt received Cytotec x1 and then was started on Pitocin. She received Fentanyl IV x 2 and then a labor epidural. AROM for clear at  1650.  Around 1730 she reported increased rectal pressure. Over the next 2 contractions  with pt pushing, an anterior lip was easily reduced. SVB over an intact perineum of a viable female at 34. Infant birthed OA, followed by slow but easy shoulders. Infant birthed through body cord. Infant placed on mother's abdomen, vigorous cry with stimulation, HR >100, pink by 5 mins. Placenta delivered with minimal maternal effort. Cord doubly clamped and cut by dad. Infant skin to skin with mom, both stable.   Anesthesia:    epidural Intrapartum complications: None GBS:    Positive PCN x2  Laceration:    none Episiotomy:    none Rectal exam:   deferred Placenta:    Delivered and expressed via active management. Intact: yes. To pathology: no.  Delayed Cord Clamping: yes Estimated Blood Loss:  50ml  Baby:    Liveborn female, APGARs 8/9, weight 3750gm  Carie Caddy, Ina Homes   [facility]  04/27/2022 7:08 PM

## 2022-01-30 ENCOUNTER — Other Ambulatory Visit: Payer: Self-pay

## 2022-01-30 ENCOUNTER — Inpatient Hospital Stay
Admission: EM | Admit: 2022-01-30 | Discharge: 2022-01-30 | Disposition: A | Discharge status: Home / Self Care | Payer: Medicaid Other | Attending: Obstetrics and Gynecology | Admitting: Obstetrics and Gynecology

## 2022-01-30 DIAGNOSIS — G43909 Migraine, unspecified, not intractable, without status migrainosus: Secondary | ICD-10-CM | POA: Insufficient documentation

## 2022-01-30 DIAGNOSIS — O26891 Other specified pregnancy related conditions, first trimester: Secondary | ICD-10-CM

## 2022-01-30 DIAGNOSIS — O368330 Maternal care for abnormalities of the fetal heart rate or rhythm, third trimester, not applicable or unspecified: Secondary | ICD-10-CM | POA: Diagnosis not present

## 2022-01-30 DIAGNOSIS — Z3A28 28 weeks gestation of pregnancy: Secondary | ICD-10-CM | POA: Diagnosis not present

## 2022-01-30 DIAGNOSIS — O99353 Diseases of the nervous system complicating pregnancy, third trimester: Secondary | ICD-10-CM | POA: Insufficient documentation

## 2022-01-30 LAB — COMPREHENSIVE METABOLIC PANEL
ALT: 8 U/L (ref 0–44)
AST: 11 U/L — ABNORMAL LOW (ref 15–41)
Albumin: 2.6 g/dL — ABNORMAL LOW (ref 3.5–5.0)
Alkaline Phosphatase: 57 U/L (ref 38–126)
Anion gap: 5 (ref 5–15)
BUN: 8 mg/dL (ref 6–20)
CO2: 23 mmol/L (ref 22–32)
Calcium: 7.8 mg/dL — ABNORMAL LOW (ref 8.9–10.3)
Chloride: 106 mmol/L (ref 98–111)
Creatinine, Ser: 0.42 mg/dL — ABNORMAL LOW (ref 0.44–1.00)
GFR, Estimated: >60 mL/min (ref >60)
Glucose, Bld: 91 mg/dL (ref 70–99)
Potassium: 3.6 mmol/L (ref 3.5–5.1)
Sodium: 134 mmol/L — ABNORMAL LOW (ref 135–145)
Total Bilirubin: 0.2 mg/dL — ABNORMAL LOW (ref 0.3–1.2)
Total Protein: 5.8 g/dL — ABNORMAL LOW (ref 6.5–8.1)

## 2022-01-30 LAB — CBC
HCT: 26.1 % — ABNORMAL LOW (ref 36.0–46.0)
Hemoglobin: 8.4 g/dL — ABNORMAL LOW (ref 12.0–15.0)
MCH: 27.5 pg (ref 26.0–34.0)
MCHC: 32.2 g/dL (ref 30.0–36.0)
MCV: 85.3 fL (ref 80.0–100.0)
Platelets: 281 10*3/uL (ref 150–400)
RBC: 3.06 MIL/uL — ABNORMAL LOW (ref 3.87–5.11)
RDW: 13.4 % (ref 11.5–15.5)
WBC: 11.1 10*3/uL — ABNORMAL HIGH (ref 4.0–10.5)
nRBC: 0 % (ref 0.0–0.2)

## 2022-01-30 LAB — PROTEIN / CREATININE RATIO, URINE
Creatinine, Urine: 55 mg/dL
Protein Creatinine Ratio: 0.18 mg/mg{Cre} — ABNORMAL HIGH (ref 0.00–0.15)
Total Protein, Urine: 10 mg/dL

## 2022-01-30 MED ORDER — ONDANSETRON 4 MG PO TBDP: 4.0000 mg | ORAL_TABLET | Freq: Once | ORAL | Status: AC

## 2022-01-30 MED ORDER — FERROUS SULFATE 325 (65 FE) MG PO TABS: 325.0000 mg | ORAL_TABLET | Freq: Two times a day (BID) | ORAL | Status: DC

## 2022-01-30 MED ORDER — BUTALBITAL-APAP-CAFFEINE 50-325-40 MG PO TABS
2.0000 | ORAL_TABLET | Freq: Once | ORAL | Status: AC
  Administered 2022-01-30: 2 via ORAL
  Filled 2022-01-30: qty 2

## 2022-01-30 MED ORDER — FERROUS SULFATE 325 (65 FE) MG PO TABS: 325.0000 mg | ORAL_TABLET | Freq: Two times a day (BID) | ORAL | 3 refills | Status: DC

## 2022-01-30 MED ORDER — BUTALBITAL-APAP-CAFFEINE 50-325-40 MG PO TABS: 1.0000 | ORAL_TABLET | Freq: Four times a day (QID) | ORAL | 0 refills | Status: DC | PRN

## 2022-01-30 MED ORDER — ONDANSETRON 4 MG PO TBDP
ORAL_TABLET | ORAL | Status: AC
  Administered 2022-01-30: 4 mg via ORAL
  Filled 2022-01-30: qty 1

## 2022-01-30 MED ORDER — ONDANSETRON 4 MG PO TBDP: 4.0000 mg | ORAL_TABLET | Freq: Three times a day (TID) | ORAL | 0 refills | Status: DC | PRN

## 2022-01-30 NOTE — OB Triage Note (Signed)
Discharged home. Left floor ambulatory with significant other. Jennifer [name] S  

## 2022-01-30 NOTE — OB Triage Note (Signed)
L&D OB Triage Note  SUBJECTIVE Jennifer [name] is a 26 y.o. [id number] female at [redacted]w[redacted]d, EDD Estimated Date of Delivery: 04/23/22 who presented to triage with complaints of migraine headache for the past several days. States she has prescription of Fioricet at home but was told by pharmacist that it could cause birth defects so she has not taken any. She has been taking Excedrin at home. State she has been " taking it like candy".  OB History  Gravida Para Term Preterm AB Living  7 3 3  0 2 3  SAB IAB Ectopic Multiple Live Births  1 1 0 0 3    # Outcome Date GA Lbr Len/2nd Weight Sex Delivery Anes PTL Lv  7 Current           6 SAB 07/2021     SAB     5 Term 11/30/19 [redacted]w[redacted]d / 00:09 3850 g F Vag-Spont EPI  LIV     Birth Comments: [id number]     Name: LONNA, RABOLD     Apgar1: 8  Apgar5: 8  4 IAB 10/2017          3 Term 08/27/17 [redacted]w[redacted]d 11:23 / 00:05 3950 g F Vag-Spont EPI  LIV     Name: Tania Ade     Apgar1: 7  Apgar5: 8  2 Term 09/03/15 [redacted]w[redacted]d / 00:24 3380 g M Vag-Spont Gen  LIV     Name: CAYSEN [place]     Apgar1: 8  Apgar5: 9  1 Gravida             Obstetric Comments  IOL for postdates with G1    Medications Prior to Admission  Medication Sig Dispense Refill Last Dose   omeprazole (PRILOSEC) 20 MG capsule Take 1 capsule (20 mg total) by mouth 2 (two) times daily before a meal. 60 capsule 1 01/29/2022   Prenatal Vit-Fe Fumarate-FA (PRENATAL VITAMINS) 28-0.8 MG TABS Take 1 tablet by mouth daily.   01/30/2022   sertraline (ZOLOFT) 50 MG tablet TAKE 1 TABLET (50 MG TOTAL) BY MOUTH DAILY. START BY TAKING 25 MG (HALF A TABLET) DAILY FOR 7 DAYS, THEN INCREASE TO 50MG  (1 TABLET) DAILY 90 tablet 1 01/29/2022   butalbital-acetaminophen-caffeine (FIORICET) 50-325-40 MG tablet Take 1-2 tablets by mouth every 6 (six) hours as needed for headache. (Patient not taking: Reported on 12/09/2021) 20 tablet 0 Not Taking   hydrOXYzine (ATARAX) 10 MG tablet Take 1 tablet (10 mg total) by mouth 3  (three) times daily as needed. (Patient not taking: Reported on 12/09/2021) 30 tablet 0 Not Taking   metroNIDAZOLE (FLAGYL) 500 MG tablet Take 1 tablet (500 mg total) by mouth 2 (two) times daily. (Patient not taking: Reported on 12/09/2021) 14 tablet 0 Not Taking     OBJECTIVE  Nursing Evaluation:   BP 99/60 (BP Location: Left Arm)   Pulse (!) 104   Temp 98.2 F (36.8 C) (Oral)   LMP 07/17/2021 (Exact Date)   Breastfeeding Unknown    Findings:   Migraine headach      NST was performed and has been reviewed by me.  NST INTERPRETATION: Category I  Mode: External Baseline Rate (A): 135 bpm (fht) Variability: Moderate Accelerations: 15 x 15 Decelerations: Variable     Contraction Frequency (min): none noted  ASSESSMENT Impression:  1.  Pregnancy:  [id number] at [redacted]w[redacted]d , EDD Estimated Date of Delivery: 04/23/22 2.  Reassuring fetal and maternal status 3.  Migraine headache that improved with  Zofran and Fioricet.  4. Fetal arrhythmia remains present at this visit. Will follow up with MFM consult     PLAN 1. Current condition and above findings reviewed.  Reassuring fetal and maternal condition. Prescriptions sent for migraine medication. Discussed Fioricet in pregnancy and low likely hood of fetal complications with minimal use.  2. Discharge home with standard labor precautions given to return to L&D or call the office for problems. 3. Continue routine prenatal care.  Philip Aspen, CNM

## 2022-02-01 ENCOUNTER — Telehealth: Payer: Self-pay

## 2022-02-01 NOTE — Telephone Encounter (Signed)
I called [facility] to follow up after her ED visi t over the weekend. She states the medicines work but like after 4-5 hours they come right back.   She hadn't picked up the iron pills so she's going to get those.  Patient will follow up this week with her appointment in office.

## 2022-02-02 ENCOUNTER — Other Ambulatory Visit: Payer: Self-pay

## 2022-02-02 ENCOUNTER — Ambulatory Visit: Payer: Medicaid Other | Admitting: Psychiatry

## 2022-02-02 DIAGNOSIS — Z131 Encounter for screening for diabetes mellitus: Secondary | ICD-10-CM

## 2022-02-02 DIAGNOSIS — Z3A28 28 weeks gestation of pregnancy: Secondary | ICD-10-CM

## 2022-02-03 ENCOUNTER — Encounter: Payer: Self-pay | Admitting: Certified Nurse Midwife

## 2022-02-03 ENCOUNTER — Ambulatory Visit (INDEPENDENT_AMBULATORY_CARE_PROVIDER_SITE_OTHER): Payer: Medicaid Other | Admitting: Certified Nurse Midwife

## 2022-02-03 ENCOUNTER — Other Ambulatory Visit: Payer: Medicaid Other

## 2022-02-03 ENCOUNTER — Other Ambulatory Visit: Payer: Self-pay | Admitting: Certified Nurse Midwife

## 2022-02-03 VITALS — BP 110/67 | HR 118 | Wt 158.3 lb

## 2022-02-03 DIAGNOSIS — Z23 Encounter for immunization: Secondary | ICD-10-CM | POA: Diagnosis not present

## 2022-02-03 DIAGNOSIS — O360130 Maternal care for anti-D [Rh] antibodies, third trimester, not applicable or unspecified: Secondary | ICD-10-CM | POA: Diagnosis not present

## 2022-02-03 DIAGNOSIS — Z8669 Personal history of other diseases of the nervous system and sense organs: Secondary | ICD-10-CM

## 2022-02-03 DIAGNOSIS — Z3A28 28 weeks gestation of pregnancy: Secondary | ICD-10-CM | POA: Diagnosis not present

## 2022-02-03 DIAGNOSIS — O99019 Anemia complicating pregnancy, unspecified trimester: Secondary | ICD-10-CM

## 2022-02-03 DIAGNOSIS — R5383 Other fatigue: Secondary | ICD-10-CM

## 2022-02-03 DIAGNOSIS — O26892 Other specified pregnancy related conditions, second trimester: Secondary | ICD-10-CM

## 2022-02-03 MED ORDER — RHO D IMMUNE GLOBULIN 1500 UNIT/2ML IJ SOSY
300.0000 ug | PREFILLED_SYRINGE | Freq: Once | INTRAMUSCULAR | Status: AC
  Administered 2022-02-03: 300 ug via INTRAMUSCULAR

## 2022-02-03 NOTE — Patient Instructions (Signed)
Td (Tetanus, Diphtheria) Vaccine: What You Need to Know 1. Why get vaccinated? Td vaccine can prevent tetanus and diphtheria. Tetanus enters the body through cuts or wounds. Diphtheria spreads from person to person. TETANUS (T) causes painful stiffening of the muscles. Tetanus can lead to serious health problems, including being unable to open the mouth, having trouble swallowing and breathing, or death. DIPHTHERIA (D) can lead to difficulty breathing, heart failure, paralysis, or death. 2. Td vaccine Td is only for children 7 years and older, adolescents, and adults.  Td is usually given as a booster dose every 10 years, or after 5 years in the case of a severe or dirty wound or burn. Another vaccine, called "Tdap," may be used instead of Td. Tdap protects against pertussis, also known as "whooping cough," in addition to tetanus and diphtheria. Td may be given at the same time as other vaccines. 3. Talk with your health care provider Tell your vaccination provider if the person getting the vaccine: Has had an allergic reaction after a previous dose of any vaccine that protects against tetanus or diphtheria, or has any severe, life-threatening allergies Has ever had Guillain-Barr Syndrome (also called "GBS") Has had severe pain or swelling after a previous dose of any vaccine that protects against tetanus or diphtheria In some cases, your health care provider may decide to postpone Td vaccination until a future visit. People with minor illnesses, such as a cold, may be vaccinated. People who are moderately or severely ill should usually wait until they recover before getting Td vaccine.  Your health care provider can give you more information. 4. Risks of a vaccine reaction Pain, redness, or swelling where the shot was given, mild fever, headache, feeling tired, and nausea, vomiting, diarrhea, or stomachache sometimes happen after Td vaccination. People sometimes faint after medical procedures,  including vaccination. Tell your provider if you feel dizzy or have vision changes or ringing in the ears.  As with any medicine, there is a very remote chance of a vaccine causing a severe allergic reaction, other serious injury, or death. 5. What if there is a serious problem? An allergic reaction could occur after the vaccinated person leaves the clinic. If you see signs of a severe allergic reaction (hives, swelling of the face and throat, difficulty breathing, a fast heartbeat, dizziness, or weakness), call 9-1-1 and get the person to the nearest hospital.  For other signs that concern you, call your health care provider.  Adverse reactions should be reported to the Vaccine Adverse Event Reporting System (VAERS). Your health care provider will usually file this report, or you can do it yourself. Visit the VAERS website at www.vaers.hhs.gov or call 1-800-822-7967. VAERS is only for reporting reactions, and VAERS staff members do not give medical advice. 6. The National Vaccine Injury Compensation Program The National Vaccine Injury Compensation Program (VICP) is a federal program that was created to compensate people who may have been injured by certain vaccines. Claims regarding alleged injury or death due to vaccination have a time limit for filing, which may be as short as two years. Visit the VICP website at www.hrsa.gov/vaccinecompensation or call 1-800-338-2382 to learn about the program and about filing a claim. 7. How can I learn more? Ask your health care provider. Call your local or state health department. Visit the website of the Food and Drug Administration (FDA) for vaccine package inserts and additional information at www.fda.gov/vaccines-blood-biologics/vaccines. Contact the Centers for Disease Control and Prevention (CDC): Call 1-800-232-4636 (1-800-CDC-INFO) or Visit   CDC's website at http://hunter.com/. Source: CDC Vaccine Information Statement Td (Tetanus, Diphtheria) Vaccine  (08/17/2019) This same material is available at http://www.wolf.info/ for no charge. This information is not intended to replace advice given to you by your health care provider. Make sure you discuss any questions you have with your health care provider. Document Revised: 11/25/2020 Document Reviewed: 09/29/2020 Elsevier Patient Education  [place].

## 2022-02-03 NOTE — Progress Notes (Signed)
Orders placed for hematology due to anemia, fatigue, and headaches in pregnancy. Discussed with Dr. Marcelline Mates, she agrees to plan.   Philip Aspen, CNM

## 2022-02-03 NOTE — Addendum Note (Signed)
Addended by: Minette Headland on: 02/03/2022 10:09 AM   Modules accepted: Orders

## 2022-02-03 NOTE — Addendum Note (Signed)
Addended by: Minette Headland on: 02/03/2022 12:10 PM   Modules accepted: Orders

## 2022-02-03 NOTE — Addendum Note (Signed)
Addended by: Minette Headland on: 02/03/2022 12:04 PM   Modules accepted: Orders

## 2022-02-03 NOTE — Progress Notes (Signed)
ROB doing well, pt state she continues to have headaches. Discussed likely that anemia is contributing to headaches. Vitamin B12 and Iron level ordered today. Discussed importance of Hydration. Discussed will consult Mds regarding early referral to hematology due to headaches ( for possible improvement). TDAP/BTC/rhogam given today. No arrhythmia noted today . Follow up 2 wks for ROB.   Philip Aspen, CNM

## 2022-02-04 ENCOUNTER — Encounter: Payer: Self-pay | Admitting: Oncology

## 2022-02-04 ENCOUNTER — Telehealth: Payer: Self-pay | Admitting: *Deleted

## 2022-02-04 ENCOUNTER — Encounter: Payer: Self-pay | Admitting: Certified Nurse Midwife

## 2022-02-04 LAB — 28 WEEKS RH-PANEL
Antibody Screen: NEGATIVE
Basophils Absolute: 0 10*3/uL (ref 0.0–0.2)
Basos: 0 %
EOS (ABSOLUTE): 0 10*3/uL (ref 0.0–0.4)
Eos: 0 %
Gestational Diabetes Screen: 99 mg/dL (ref 70–139)
HIV Screen 4th Generation wRfx: NONREACTIVE
Hematocrit: 29.6 % — ABNORMAL LOW (ref 34.0–46.6)
Hemoglobin: 9.5 g/dL — ABNORMAL LOW (ref 11.1–15.9)
Immature Grans (Abs): 0.1 10*3/uL (ref 0.0–0.1)
Immature Granulocytes: 1 %
Lymphocytes Absolute: 0.6 10*3/uL — ABNORMAL LOW (ref 0.7–3.1)
Lymphs: 5 %
MCH: 26.8 pg (ref 26.6–33.0)
MCHC: 32.1 g/dL (ref 31.5–35.7)
MCV: 83 fL (ref 79–97)
Monocytes Absolute: 0.7 10*3/uL (ref 0.1–0.9)
Monocytes: 5 %
Neutrophils Absolute: 10.7 10*3/uL — ABNORMAL HIGH (ref 1.4–7.0)
Neutrophils: 89 %
Platelets: 303 10*3/uL (ref 150–450)
RBC: 3.55 x10E6/uL — ABNORMAL LOW (ref 3.77–5.28)
RDW: 12.6 % (ref 11.7–15.4)
RPR Ser Ql: NONREACTIVE
WBC: 12.1 10*3/uL — ABNORMAL HIGH (ref 3.4–10.8)

## 2022-02-04 NOTE — Telephone Encounter (Signed)
Nurse placed call to patient to review appointment details for upcoming new patient  consultation visit. Patient confirmed appointment for tomorrow with Dr. Grayland Ormond at 11:30 am. Patient denies any questions or concerns regarding visit.

## 2022-02-05 ENCOUNTER — Encounter: Payer: Self-pay | Admitting: Oncology

## 2022-02-05 ENCOUNTER — Inpatient Hospital Stay: Payer: Medicaid Other | Attending: Oncology | Admitting: Oncology

## 2022-02-05 ENCOUNTER — Inpatient Hospital Stay: Payer: Medicaid Other

## 2022-02-05 VITALS — BP 104/67 | HR 107 | Temp 97.5°F | Resp 16 | Ht 62.0 in | Wt 157.0 lb

## 2022-02-05 DIAGNOSIS — R635 Abnormal weight gain: Secondary | ICD-10-CM | POA: Diagnosis not present

## 2022-02-05 DIAGNOSIS — D509 Iron deficiency anemia, unspecified: Secondary | ICD-10-CM | POA: Insufficient documentation

## 2022-02-05 DIAGNOSIS — R519 Headache, unspecified: Secondary | ICD-10-CM | POA: Diagnosis not present

## 2022-02-05 DIAGNOSIS — Z803 Family history of malignant neoplasm of breast: Secondary | ICD-10-CM | POA: Diagnosis not present

## 2022-02-05 DIAGNOSIS — Z8249 Family history of ischemic heart disease and other diseases of the circulatory system: Secondary | ICD-10-CM | POA: Diagnosis not present

## 2022-02-05 DIAGNOSIS — O99013 Anemia complicating pregnancy, third trimester: Secondary | ICD-10-CM

## 2022-02-05 DIAGNOSIS — Z79899 Other long term (current) drug therapy: Secondary | ICD-10-CM

## 2022-02-05 DIAGNOSIS — Z801 Family history of malignant neoplasm of trachea, bronchus and lung: Secondary | ICD-10-CM | POA: Diagnosis not present

## 2022-02-05 DIAGNOSIS — Z3A00 Weeks of gestation of pregnancy not specified: Secondary | ICD-10-CM | POA: Diagnosis not present

## 2022-02-05 DIAGNOSIS — R5383 Other fatigue: Secondary | ICD-10-CM

## 2022-02-05 DIAGNOSIS — Z87891 Personal history of nicotine dependence: Secondary | ICD-10-CM | POA: Diagnosis not present

## 2022-02-05 DIAGNOSIS — Z6828 Body mass index (BMI) 28.0-28.9, adult: Secondary | ICD-10-CM | POA: Insufficient documentation

## 2022-02-05 DIAGNOSIS — Z885 Allergy status to narcotic agent status: Secondary | ICD-10-CM | POA: Insufficient documentation

## 2022-02-05 LAB — SPECIMEN STATUS REPORT

## 2022-02-05 LAB — FERRITIN: Ferritin: 11 ng/mL — ABNORMAL LOW (ref 15–150)

## 2022-02-05 LAB — VITAMIN B12: Vitamin B-12: 376 pg/mL (ref 232–1245)

## 2022-02-05 NOTE — Progress Notes (Signed)
Jennifer [name]  [phone] [phone]  ID: Kemper Durie OB: Oct 23, 1995  MR#: [id number]  VQM#:[id number]  Patient Care Team: Berline Chough, MD as PCP - General (Internal Medicine)  CHIEF COMPLAINT: Iron deficiency anemia in pregnancy.  INTERVAL HISTORY: Patient is a 27 year old female in the third trimester of her fourth child who was noted to have a declining hemoglobin and iron stores.  She is also symptomatic with headache and increasing fatigue.  She otherwise feels well.  She has no neurologic complaints.  She denies any recent fevers or illnesses.  She has a good appetite and is gaining weight appropriately.  She has no chest pain, shortness of breath, cough, or hemoptysis.  She denies any nausea, vomiting, constipation, or diarrhea.  She has no urinary complaints.  Patient offers no further specific complaints today.  REVIEW OF SYSTEMS:   Review of Systems  Constitutional:  Positive for malaise/fatigue. Negative for fever and weight loss.  Respiratory: Negative.  Negative for cough, hemoptysis and shortness of breath.   Cardiovascular: Negative.  Negative for chest pain and leg swelling.  Gastrointestinal: Negative.  Negative for abdominal pain, blood in stool and melena.  Genitourinary: Negative.  Negative for hematuria.  Musculoskeletal: Negative.  Negative for back pain.  Skin: Negative.  Negative for rash.  Neurological:  Positive for headaches. Negative for dizziness, focal weakness and weakness.  Psychiatric/Behavioral: Negative.  The patient is not nervous/anxious.     As per HPI. Otherwise, a complete review of systems is negative.  PAST MEDICAL HISTORY: Past Medical History:  Diagnosis Date   ADHD (attention deficit hyperactivity disorder), combined type 10/27/2017   by history/ Milton Ferguson LCSW   Adjustment disorder with mixed anxiety and depressed mood 10/27/2017   Milton Ferguson LCSW   Anemia    Anxiety     Environmental and seasonal allergies    Lost custody of children 02/28/2018   Loss custody of 2 children/per Donnal Moat CNM    PAST SURGICAL HISTORY: Past Surgical History:  Procedure Laterality Date   WISDOM TOOTH EXTRACTION     age 32 - all four    FAMILY HISTORY: Family History  Problem Relation Age of Onset   Hypertension Maternal Grandmother    Breast cancer Other    Cancer Other 27       lung    ADVANCED DIRECTIVES (Y/N):  N  HEALTH MAINTENANCE: Social History   Tobacco Use   Smoking status: Former    Packs/day: 1.00    Types: Cigarettes    Quit date: 04/28/2015    Years since quitting: 6.7   Smokeless tobacco: Never   Tobacco comments:    Not since 2021  Vaping Use   Vaping Use: Former  Substance Use Topics   Alcohol use: Not Currently   Drug use: Not Currently    Types: Marijuana     Colonoscopy:  PAP:  Bone density:  Lipid panel:  Allergies  Allergen Reactions   Fruit Extracts Hives    Pt is allergic to all fruits especially avocado's and fruits with seeds.   Hydrocodone Hives and Itching   Vicodin [Hydrocodone-Acetaminophen] Itching    Current Outpatient Medications  Medication Sig Dispense Refill   butalbital-acetaminophen-caffeine (FIORICET) 50-325-40 MG tablet Take 1-2 tablets by mouth every 6 (six) hours as needed for headache. 20 tablet 0   omeprazole (PRILOSEC) 20 MG capsule Take 1 capsule (20 mg total) by mouth 2 (two) times daily before a meal. 60 capsule 1  ondansetron (ZOFRAN-ODT) 4 MG disintegrating tablet Take 1 tablet (4 mg total) by mouth every 8 (eight) hours as needed for nausea or vomiting. 20 tablet 0   Prenatal Vit-Fe Fumarate-FA (PRENATAL VITAMINS) 28-0.8 MG TABS Take 1 tablet by mouth daily.     sertraline (ZOLOFT) 50 MG tablet TAKE 1 TABLET (50 MG TOTAL) BY MOUTH DAILY. START BY TAKING 25 MG (HALF A TABLET) DAILY FOR 7 DAYS, THEN INCREASE TO 50MG  (1 TABLET) DAILY 90 tablet 1   butalbital-acetaminophen-caffeine  (FIORICET) 50-325-40 MG tablet Take 1-2 tablets by mouth every 6 (six) hours as needed for headache. (Patient not taking: Reported on 12/09/2021) 20 tablet 0   ferrous sulfate 325 (65 FE) MG tablet Take 1 tablet (325 mg total) by mouth 2 (two) times daily with a meal. (Patient not taking: Reported on 02/05/2022) 60 tablet 3   No current facility-administered medications for this visit.    OBJECTIVE: Vitals:   02/05/22 1141  BP: 104/67  Pulse: (!) 107  Resp: 16  Temp: (!) 97.5 F (36.4 C)  SpO2: 99%     Body mass index is 28.72 kg/m.    ECOG FS:1 - Symptomatic but completely ambulatory  General: Well-developed, well-nourished, no acute distress. Eyes: Pink conjunctiva, anicteric sclera. HEENT: Normocephalic, moist mucous membranes. Lungs: No audible wheezing or coughing. Heart: Regular rate and rhythm. Abdomen: Appears appropriate for gestational age. Musculoskeletal: No edema, cyanosis, or clubbing. Neuro: Alert, answering all questions appropriately. Cranial nerves grossly intact. Skin: No rashes or petechiae noted. Psych: Normal affect. Lymphatics: No cervical, calvicular, axillary or inguinal LAD.   LAB RESULTS:  Lab Results  Component Value Date   NA 134 (L) 01/30/2022   K 3.6 01/30/2022   CL 106 01/30/2022   CO2 23 01/30/2022   GLUCOSE 91 01/30/2022   BUN 8 01/30/2022   CREATININE 0.42 (L) 01/30/2022   CALCIUM 7.8 (L) 01/30/2022   PROT 5.8 (L) 01/30/2022   ALBUMIN 2.6 (L) 01/30/2022   AST 11 (L) 01/30/2022   ALT 8 01/30/2022   ALKPHOS 57 01/30/2022   BILITOT 0.2 (L) 01/30/2022   GFRNONAA >60 01/30/2022   GFRAA >60 05/23/2016    Lab Results  Component Value Date   WBC 12.1 (H) 02/03/2022   NEUTROABS 10.7 (H) 02/03/2022   HGB 9.5 (L) 02/03/2022   HCT 29.6 (L) 02/03/2022   MCV 83 02/03/2022   PLT 303 02/03/2022   Lab Results  Component Value Date   FERRITIN 11 (L) 02/03/2022     STUDIES: No results found.  ASSESSMENT: Iron deficiency anemia in  pregnancy.  PLAN:    Iron deficiency anemia in pregnancy: Patient has received IV Venofer in the past in 2019.  Hemoglobin and iron stores are reduced and she is mildly symptomatic.  Return to clinic 5 times over the next 2 to 3 weeks to receive 200 mg IV Venofer.  Patient will then return to clinic at the end of March prior to her due date for repeat laboratory work, further evaluation, and consideration of additional IV iron if necessary. Pregnancy: Patient's estimated date of delivery for her daughter is April 23, 2022.   I spent a total of 45 minutes reviewing chart data, face-to-face evaluation with the patient, counseling and coordination of care as detailed above.   Patient expressed understanding and was in agreement with this plan. She also understands that She can call clinic at any time with any questions, concerns, or complaints.    April 25, 2022, MD  02/05/2022 12:10 PM

## 2022-02-09 ENCOUNTER — Inpatient Hospital Stay: Payer: Medicaid Other

## 2022-02-09 VITALS — BP 93/58 | HR 98 | Temp 98.9°F | Resp 20

## 2022-02-09 DIAGNOSIS — O99013 Anemia complicating pregnancy, third trimester: Secondary | ICD-10-CM | POA: Diagnosis not present

## 2022-02-09 DIAGNOSIS — D509 Iron deficiency anemia, unspecified: Secondary | ICD-10-CM

## 2022-02-09 MED ORDER — SODIUM CHLORIDE 0.9 % IV SOLN
200.0000 mg | Freq: Once | INTRAVENOUS | Status: AC
  Administered 2022-02-09: 200 mg via INTRAVENOUS
  Filled 2022-02-09: qty 200

## 2022-02-09 MED ORDER — SODIUM CHLORIDE 0.9 % IV SOLN
Freq: Once | INTRAVENOUS | Status: AC
  Filled 2022-02-09: qty 250

## 2022-02-10 ENCOUNTER — Ambulatory Visit (INDEPENDENT_AMBULATORY_CARE_PROVIDER_SITE_OTHER): Payer: Medicaid Other | Admitting: Obstetrics and Gynecology

## 2022-02-10 ENCOUNTER — Telehealth: Payer: Self-pay

## 2022-02-10 ENCOUNTER — Encounter: Payer: Self-pay | Admitting: Obstetrics and Gynecology

## 2022-02-10 VITALS — BP 108/68 | HR 103 | Wt 162.1 lb

## 2022-02-10 DIAGNOSIS — Z3A29 29 weeks gestation of pregnancy: Secondary | ICD-10-CM

## 2022-02-10 DIAGNOSIS — Z3483 Encounter for supervision of other normal pregnancy, third trimester: Secondary | ICD-10-CM

## 2022-02-10 DIAGNOSIS — Z8669 Personal history of other diseases of the nervous system and sense organs: Secondary | ICD-10-CM

## 2022-02-10 LAB — POCT URINALYSIS DIPSTICK OB
Bilirubin, UA: NEGATIVE
Blood, UA: NEGATIVE
Glucose, UA: NEGATIVE
Ketones, UA: NEGATIVE
Leukocytes, UA: NEGATIVE
Nitrite, UA: NEGATIVE
Spec Grav, UA: 1.025 (ref 1.010–1.025)
Urobilinogen, UA: 0.2 E.U./dL
pH, UA: 6 (ref 5.0–8.0)

## 2022-02-10 MED ORDER — SUMATRIPTAN SUCCINATE 50 MG PO TABS: 50.0000 mg | ORAL_TABLET | Freq: Once | ORAL | 0 refills | Status: DC | PRN

## 2022-02-10 NOTE — Progress Notes (Signed)
ROB: She continues to experience migraine headaches.  At least, she describes them as migraine headaches.  Some evidence of photophobia.  She is otherwise well during her pregnancy.  Her hemoglobin was 9.5 and she received an iron infusion yesterday.  Encouraged to increase her hydration levels.  Have discussed continued use of Fioricet (not for use more than 5 days) prescription for sumatriptan given for patient to try.

## 2022-02-10 NOTE — Progress Notes (Signed)
ROB. Patient states fetal movement and pelvic pressure. She has been dealing with headaches since 1/20 and is feeling awful. Patient reports completing prescription of Fioricet and is now taking 3 500 mg  extra strength tylenol every 6 hours. She had her first iron infusion yesterday.

## 2022-02-10 NOTE — Telephone Encounter (Signed)
Pt is scheduled with Dr. Amalia Hailey on 02/10/2022 at 3:00.

## 2022-02-10 NOTE — Telephone Encounter (Signed)
Pt called triage, she is still having headaches. She is out of FIORICET Rx, can she get a refill? Has been doing tylenol 3-500 mg every 6 hours and it is not touching her headache. Started her iron infusion yesterday. Pls advise.

## 2022-02-11 MED FILL — Iron Sucrose Inj 20 MG/ML (Fe Equiv): INTRAVENOUS | Qty: 10 | Status: AC

## 2022-02-12 ENCOUNTER — Inpatient Hospital Stay: Payer: Medicaid Other | Attending: Oncology

## 2022-02-12 VITALS — BP 103/70 | HR 102 | Temp 98.0°F | Resp 18

## 2022-02-12 DIAGNOSIS — O99013 Anemia complicating pregnancy, third trimester: Secondary | ICD-10-CM | POA: Diagnosis present

## 2022-02-12 DIAGNOSIS — Z79899 Other long term (current) drug therapy: Secondary | ICD-10-CM | POA: Insufficient documentation

## 2022-02-12 DIAGNOSIS — Z3A00 Weeks of gestation of pregnancy not specified: Secondary | ICD-10-CM | POA: Insufficient documentation

## 2022-02-12 DIAGNOSIS — D509 Iron deficiency anemia, unspecified: Secondary | ICD-10-CM | POA: Diagnosis present

## 2022-02-12 MED ORDER — SODIUM CHLORIDE 0.9 % IV SOLN
Freq: Once | INTRAVENOUS | Status: AC
  Filled 2022-02-12: qty 250

## 2022-02-12 MED ORDER — SODIUM CHLORIDE 0.9 % IV SOLN
200.0000 mg | Freq: Once | INTRAVENOUS | Status: AC
  Administered 2022-02-12: 200 mg via INTRAVENOUS
  Filled 2022-02-12: qty 200

## 2022-02-12 NOTE — Progress Notes (Signed)
Pt has been educated and understands. Pt refused to stay 30 mins after iron infusion. VS within her norm.

## 2022-02-15 ENCOUNTER — Inpatient Hospital Stay: Payer: Medicaid Other

## 2022-02-15 VITALS — BP 91/52 | HR 102 | Temp 97.0°F

## 2022-02-15 DIAGNOSIS — D509 Iron deficiency anemia, unspecified: Secondary | ICD-10-CM

## 2022-02-15 DIAGNOSIS — O99013 Anemia complicating pregnancy, third trimester: Secondary | ICD-10-CM | POA: Diagnosis not present

## 2022-02-15 MED ORDER — SODIUM CHLORIDE 0.9 % IV SOLN
200.0000 mg | Freq: Once | INTRAVENOUS | Status: AC
  Administered 2022-02-15: 200 mg via INTRAVENOUS
  Filled 2022-02-15: qty 200

## 2022-02-15 MED ORDER — SODIUM CHLORIDE 0.9 % IV SOLN
Freq: Once | INTRAVENOUS | Status: AC
  Filled 2022-02-15: qty 250

## 2022-02-15 NOTE — Progress Notes (Signed)
Refused 30 minute post-infusion observation. Aware of s/sx of reaction and when to seek emergency medical attention. VSS at discharge.

## 2022-02-15 NOTE — Patient Instructions (Signed)

## 2022-02-16 MED FILL — Iron Sucrose Inj 20 MG/ML (Fe Equiv): INTRAVENOUS | Qty: 10 | Status: AC

## 2022-02-17 ENCOUNTER — Inpatient Hospital Stay: Payer: Medicaid Other

## 2022-02-19 ENCOUNTER — Encounter: Payer: Medicaid Other | Admitting: Licensed Practical Nurse

## 2022-02-19 ENCOUNTER — Inpatient Hospital Stay: Payer: Medicaid Other

## 2022-02-19 VITALS — BP 98/60 | HR 105 | Temp 97.7°F | Resp 16

## 2022-02-19 DIAGNOSIS — D509 Iron deficiency anemia, unspecified: Secondary | ICD-10-CM

## 2022-02-19 DIAGNOSIS — O99013 Anemia complicating pregnancy, third trimester: Secondary | ICD-10-CM | POA: Diagnosis not present

## 2022-02-19 MED ORDER — SODIUM CHLORIDE 0.9 % IV SOLN
Freq: Once | INTRAVENOUS | Status: AC
  Filled 2022-02-19: qty 250

## 2022-02-19 MED ORDER — SODIUM CHLORIDE 0.9 % IV SOLN
200.0000 mg | Freq: Once | INTRAVENOUS | Status: AC
  Administered 2022-02-19: 200 mg via INTRAVENOUS
  Filled 2022-02-19: qty 200

## 2022-02-24 ENCOUNTER — Inpatient Hospital Stay: Payer: Medicaid Other

## 2022-02-24 VITALS — BP 97/58 | HR 97 | Temp 98.1°F | Resp 18

## 2022-02-24 DIAGNOSIS — O99013 Anemia complicating pregnancy, third trimester: Secondary | ICD-10-CM | POA: Diagnosis not present

## 2022-02-24 DIAGNOSIS — D509 Iron deficiency anemia, unspecified: Secondary | ICD-10-CM

## 2022-02-24 MED ORDER — SODIUM CHLORIDE 0.9 % IV SOLN
Freq: Once | INTRAVENOUS | Status: AC
  Filled 2022-02-24: qty 250

## 2022-02-24 MED ORDER — SODIUM CHLORIDE 0.9 % IV SOLN
200.0000 mg | Freq: Once | INTRAVENOUS | Status: AC
  Administered 2022-02-24: 200 mg via INTRAVENOUS
  Filled 2022-02-24: qty 200

## 2022-02-24 NOTE — Progress Notes (Signed)
Refused 30 minute observation post-infusion. VSS at discharge. Aware of potential complications and when to seek medical assistance.

## 2022-02-24 NOTE — Patient Instructions (Signed)

## 2022-02-25 ENCOUNTER — Encounter: Payer: Medicaid Other | Admitting: Obstetrics and Gynecology

## 2022-02-25 DIAGNOSIS — Z3483 Encounter for supervision of other normal pregnancy, third trimester: Secondary | ICD-10-CM

## 2022-02-25 DIAGNOSIS — Z3A31 31 weeks gestation of pregnancy: Secondary | ICD-10-CM

## 2022-03-02 ENCOUNTER — Encounter: Payer: Self-pay | Admitting: Advanced Practice Midwife

## 2022-03-02 ENCOUNTER — Ambulatory Visit (INDEPENDENT_AMBULATORY_CARE_PROVIDER_SITE_OTHER): Payer: Medicaid Other | Admitting: Advanced Practice Midwife

## 2022-03-02 VITALS — BP 97/66 | HR 114 | Wt 158.0 lb

## 2022-03-02 DIAGNOSIS — Z3483 Encounter for supervision of other normal pregnancy, third trimester: Secondary | ICD-10-CM

## 2022-03-02 DIAGNOSIS — Z3A32 32 weeks gestation of pregnancy: Secondary | ICD-10-CM

## 2022-03-02 LAB — POCT URINALYSIS DIPSTICK OB
Bilirubin, UA: NEGATIVE
Blood, UA: NEGATIVE
Glucose, UA: NEGATIVE
Ketones, UA: NEGATIVE
Leukocytes, UA: NEGATIVE
Nitrite, UA: NEGATIVE
POC,PROTEIN,UA: NEGATIVE
Spec Grav, UA: 1.015 (ref 1.010–1.025)
Urobilinogen, UA: 0.2 E.U./dL
pH, UA: 7 (ref 5.0–8.0)

## 2022-03-02 NOTE — Progress Notes (Signed)
Routine Prenatal Care Visit  Subjective  Jennifer [name] is a 27 y.o. [phone] at 78w4dbeing seen today for ongoing prenatal care.  She is currently monitored for the following issues for this low-risk pregnancy and has ADHD (attention deficit hyperactivity disorder); Allergic rhinitis; Marijuana use; Rh negative state in antepartum period; Iron deficiency anemia; Tobacco use during pregnancy, antepartum; Supervision of other normal pregnancy, antepartum; Fetal arrhythmia affecting pregnancy, antepartum; and Seasonal allergies on their problem list.  ----------------------------------------------------------------------------------- Patient reports  headaches have gone since having iron infusions .   Contractions: Not present. Vag. Bleeding: None.  Movement: Present. Leaking Fluid denies.  ----------------------------------------------------------------------------------- The following portions of the patient's history were reviewed and updated as appropriate: allergies, current medications, past family history, past medical history, past social history, past surgical history and problem list. Problem list updated.  Objective  Blood pressure 97/66, pulse (!) 114, weight 158 lb (71.7 kg), last menstrual period 07/17/2021 Pregravid weight 140 lb (63.5 kg) Total Weight Gain 18 lb (8.165 kg) Urinalysis: Urine Protein Negative  Urine Glucose Negative  Fetal Status: Fetal Heart Rate (bpm): 143 Fundal Height: 32 cm Movement: Present     General:  Alert, oriented and cooperative. Patient is in no acute distress.  Skin: Skin is warm and dry. No rash noted.   Cardiovascular: Normal heart rate noted  Respiratory: Normal respiratory effort, no problems with respiration noted  Abdomen: Soft, gravid, appropriate for gestational age. Pain/Pressure: Absent     Pelvic:  Cervical exam deferred        Extremities: Normal range of motion.  Edema: None  Mental Status: Normal mood and affect. Normal behavior.  Normal judgment and thought content.   Assessment   27y.o. [id number]  04/23/2022, by Last Menstrual Period presenting for routine prenatal visit  Plan   seventh Problems (from 01/30/22 to present)     Preterm labor symptoms and general obstetric precautions including but not limited to vaginal bleeding, contractions, leaking of fluid and fetal movement were reviewed in detail with the patient. Please refer to After Visit Summary for other counseling recommendations.   Return in about 2 weeks (around 03/16/2022) for rob.  JaRod CanCNM 03/02/2022 11:04 AM

## 2022-03-03 ENCOUNTER — Encounter: Payer: Medicaid Other | Admitting: Obstetrics

## 2022-03-08 ENCOUNTER — Observation Stay
Admission: EM | Admit: 2022-03-08 | Discharge: 2022-03-09 | Disposition: A | Discharge status: Home / Self Care | Payer: Medicaid Other | Attending: Obstetrics and Gynecology | Admitting: Obstetrics and Gynecology

## 2022-03-08 DIAGNOSIS — Z3A33 33 weeks gestation of pregnancy: Secondary | ICD-10-CM | POA: Insufficient documentation

## 2022-03-08 DIAGNOSIS — O26899 Other specified pregnancy related conditions, unspecified trimester: Secondary | ICD-10-CM | POA: Diagnosis present

## 2022-03-08 DIAGNOSIS — O4693 Antepartum hemorrhage, unspecified, third trimester: Principal | ICD-10-CM | POA: Insufficient documentation

## 2022-03-08 DIAGNOSIS — Z79899 Other long term (current) drug therapy: Secondary | ICD-10-CM | POA: Insufficient documentation

## 2022-03-08 DIAGNOSIS — Z87891 Personal history of nicotine dependence: Secondary | ICD-10-CM | POA: Insufficient documentation

## 2022-03-09 ENCOUNTER — Encounter: Payer: Self-pay | Admitting: Obstetrics and Gynecology

## 2022-03-09 ENCOUNTER — Other Ambulatory Visit: Payer: Self-pay

## 2022-03-09 ENCOUNTER — Telehealth: Payer: Self-pay

## 2022-03-09 DIAGNOSIS — Z79899 Other long term (current) drug therapy: Secondary | ICD-10-CM | POA: Diagnosis not present

## 2022-03-09 DIAGNOSIS — Z87891 Personal history of nicotine dependence: Secondary | ICD-10-CM | POA: Diagnosis not present

## 2022-03-09 DIAGNOSIS — O26853 Spotting complicating pregnancy, third trimester: Secondary | ICD-10-CM | POA: Diagnosis not present

## 2022-03-09 DIAGNOSIS — O26899 Other specified pregnancy related conditions, unspecified trimester: Secondary | ICD-10-CM | POA: Diagnosis present

## 2022-03-09 DIAGNOSIS — Z3A33 33 weeks gestation of pregnancy: Secondary | ICD-10-CM | POA: Diagnosis not present

## 2022-03-09 DIAGNOSIS — O4693 Antepartum hemorrhage, unspecified, third trimester: Secondary | ICD-10-CM | POA: Diagnosis present

## 2022-03-09 NOTE — Final Progress Note (Signed)
OB/Triage Note  Patient ID: Jennifer [name] MRN: [id number] DOB/AGE: [birth year] 27 y.o.  Subjective  History of Present Illness: The patient is a 27 y.o. female [phone] at 51w4dwho presents for vaginal bleeding following intercourse. Reports slight pink vaginal discharge following sex that she had to wipe off with a wash clothe, has never had this much bleeding with sex before. Did not return after being wiped away. Denies contractions, LOF, HA, abdominal pain. Reports good fetal movement.    Past Medical History:  Diagnosis Date   ADHD (attention deficit hyperactivity disorder), combined type 10/27/2017   by history/ AMilton FergusonLCSW   Adjustment disorder with mixed anxiety and depressed mood 10/27/2017   AMilton FergusonLCSW   Anemia    Anxiety    Environmental and seasonal allergies    Lost custody of children 02/28/2018   Loss custody of 2 children/per EDonnal MoatCNM    Past Surgical History:  Procedure Laterality Date   WISDOM TOOTH EXTRACTION     age 27- all four    No current facility-administered medications on file prior to encounter.   Current Outpatient Medications on File Prior to Encounter  Medication Sig Dispense Refill   omeprazole (PRILOSEC) 20 MG capsule Take 1 capsule (20 mg total) by mouth 2 (two) times daily before a meal. 60 capsule 1   Prenatal Vit-Fe Fumarate-FA (PRENATAL VITAMINS) 28-0.8 MG TABS Take 1 tablet by mouth daily.     sertraline (ZOLOFT) 50 MG tablet TAKE 1 TABLET (50 MG TOTAL) BY MOUTH DAILY. START BY TAKING 25 MG (HALF A TABLET) DAILY FOR 7 DAYS, THEN INCREASE TO '50MG'$  (1 TABLET) DAILY 90 tablet 1   ondansetron (ZOFRAN-ODT) 4 MG disintegrating tablet Take 1 tablet (4 mg total) by mouth every 8 (eight) hours as needed for nausea or vomiting. 20 tablet 0   SUMAtriptan (IMITREX) 50 MG tablet Take 1 tablet (50 mg total) by mouth once as needed for up to 1 dose for migraine. May repeat in 2 hours if headache persists or recurs. 20 tablet 0     Allergies  Allergen Reactions   Fruit Extracts Hives    Pt is allergic to all fruits especially avocado's and fruits with seeds.   Hydrocodone Hives and Itching   Vicodin [Hydrocodone-Acetaminophen] Itching    Social History   Socioeconomic History   Marital status: Single    Spouse name: Not on file   Number of children: 3   Years of education: 145  Highest education level: Not on file  Occupational History   Occupation: stay at home mom  Tobacco Use   Smoking status: Former    Packs/day: 1.00    Types: Cigarettes    Quit date: 04/28/2015    Years since quitting: 6.8   Smokeless tobacco: Never   Tobacco comments:    Not since 2021  Vaping Use   Vaping Use: Former  Substance and Sexual Activity   Alcohol use: Not Currently   Drug use: Not Currently    Types: Marijuana   Sexual activity: Yes    Partners: Male    Birth control/protection: None    Comment: possibly nexplanon  Other Topics Concern   Not on file  Social History Narrative   Not on file   Social Determinants of Health   Financial Resource Strain: Low Risk  (09/16/2021)   Overall Financial Resource Strain (CARDIA)    Difficulty of Paying Living Expenses: Not hard at all  Food Insecurity: No  Food Insecurity (02/05/2022)   Hunger Vital Sign    Worried About Running Out of Food in the Last Year: Never true    Ran Out of Food in the Last Year: Never true  Transportation Needs: No Transportation Needs (02/05/2022)   PRAPARE - Hydrologist (Medical): No    Lack of Transportation (Non-Medical): No  Physical Activity: Insufficiently Active (09/16/2021)   Exercise Vital Sign    Days of Exercise per Week: 2 days    Minutes of Exercise per Session: 60 min  Stress: No Stress Concern Present (09/16/2021)   [place]    Feeling of Stress : Not at all  Social Connections: Moderately Isolated (09/16/2021)   Social  Connection and Isolation Panel [NHANES]    Frequency of Communication with Friends and Family: More than three times a week    Frequency of Social Gatherings with Friends and Family: Once a week    Attends Religious Services: Never    Marine scientist or Organizations: No    Attends Archivist Meetings: Never    Marital Status: Living with partner  Intimate Partner Violence: Not At Risk (02/05/2022)   Humiliation, Afraid, Rape, and Kick questionnaire    Fear of Current or Ex-Partner: No    Emotionally Abused: No    Physically Abused: No    Sexually Abused: No    Family History  Problem Relation Age of Onset   Hypertension Maternal Grandmother    Breast cancer Other    Cancer Other 72       lung     ROS    Objective   Physical Exam: LMP 07/17/2021 (Exact Date)   OBGyn Exam  FHT 130, mod variability, pos accels, no decels Toco no contractions     Hospital Course: The patient was admitted to [facility] Triage for observation. Bleeding had already resolved. There were no s/sx of PTL. She was anxious to leave as FOB had to be a work at [place] and no continued bleeding, was d/c'd home. Has ROB 03/20/22.Bleeding likely from recent IC.  Assessment: 27 y.o. female [phone] at [redacted]w[redacted]d RNST  Plan: [facility] Teaching: s/sx of PTL or worrisome bleeding   Allergies as of 03/09/2022       Reactions   Fruit Extracts Hives   Pt is allergic to all fruits especially avocado's and fruits with seeds.   Hydrocodone Hives, Itching   Vicodin [hydrocodone-acetaminophen] Itching        Medication List     TAKE these medications    omeprazole 20 MG capsule Commonly known as: PRILOSEC Take 1 capsule (20 mg total) by mouth 2 (two) times daily before a meal.   ondansetron 4 MG disintegrating tablet Commonly known as: ZOFRAN-ODT Take 1 tablet (4 mg total) by mouth every 8 (eight) hours as needed for nausea or vomiting.   Prenatal  Vitamins 28-0.8 MG Tabs Take 1 tablet by mouth daily.   sertraline 50 MG tablet Commonly known as: ZOLOFT TAKE 1 TABLET (50 MG TOTAL) BY MOUTH DAILY. START BY TAKING 25 MG (HALF A TABLET) DAILY FOR 7 DAYS, THEN INCREASE TO '50MG'$  (1 TABLET) DAILY   SUMAtriptan 50 MG tablet Commonly known as: IMITREX Take 1 tablet (50 mg total) by mouth once as needed for up to 1 dose for migraine. May repeat in 2 hours if headache persists or recurs.  Total time spent taking care of this patient: 60 minutes  Signed: Maggie Font CNM, FNP 03/09/2022, 3:15 AM

## 2022-03-09 NOTE — OB Triage Note (Signed)
Jennifer [name] 26 y.o. presents to Labor & Delivery triage via wheelchair steered by ED staff reporting vaginal bleeding. She is a [id number] at [redacted]w[redacted]d. She denies signs and symptoms consistent with rupture of membranes. She denies contractions and states positive fetal movement. External FM and TOCO applied to non-tender abdomen. Initial FHR 145. Vital signs obtained and within normal limits. Patient oriented to care environment including call bell and bed control use. MMaggie Font CNM notified of patient's arrival.

## 2022-03-10 ENCOUNTER — Telehealth: Payer: Self-pay

## 2022-03-10 NOTE — Telephone Encounter (Signed)
Pt calling; please call back.  [phone]  Pt is 33wks; has been throwing up all night, back hurting badly and is cramping; good FM; no bleeding; has been able to keep ice and ginger ale down.  Adv back pain and cramping is probably from heaving so hard; cramping can also be from being dehydrated; adv sip clear liquids for 24hrs, then bland diet for 24hrs, then work up to reg diet.  Pt voices understanding.

## 2022-03-16 ENCOUNTER — Telehealth: Payer: Self-pay | Admitting: Obstetrics

## 2022-03-16 ENCOUNTER — Encounter: Payer: Medicaid Other | Admitting: Obstetrics

## 2022-03-16 NOTE — Telephone Encounter (Signed)
Reached out to pt to reschedule ROB appt that was scheduled with MMF at 2:35 on 03/16/2022.  Left message for pt to call back.

## 2022-03-17 ENCOUNTER — Telehealth: Payer: Self-pay | Admitting: Obstetrics

## 2022-03-17 NOTE — Telephone Encounter (Signed)
Pt was rescheduled to 03/30/22 with Opal Sidles at 1:55.

## 2022-03-17 NOTE — Telephone Encounter (Signed)
The patient called back to rescheduled. First available to offer is 3/19 with JEG. The patient has been added to the cancellation list.

## 2022-03-19 ENCOUNTER — Telehealth: Payer: Self-pay

## 2022-03-19 NOTE — Telephone Encounter (Signed)
Pt called triage and said last night she got up from sitting and felt fluid down her leg, it was not a lot, but enough that she felt it. She smelled it and did NOT smell like urine. It happened once only. She denies having any contractions, spotting/bleeding. Advised if it happens again to change into a dry underwear and if it keeps getting wet, to go to L&D.

## 2022-03-30 ENCOUNTER — Encounter: Payer: Self-pay | Admitting: Advanced Practice Midwife

## 2022-03-30 ENCOUNTER — Other Ambulatory Visit (HOSPITAL_COMMUNITY)
Admission: RE | Admit: 2022-03-30 | Discharge: 2022-03-30 | Disposition: A | Discharge status: Home / Self Care | Payer: Medicaid Other | Source: Ambulatory Visit | Attending: Obstetrics | Admitting: Obstetrics

## 2022-03-30 ENCOUNTER — Ambulatory Visit (INDEPENDENT_AMBULATORY_CARE_PROVIDER_SITE_OTHER): Payer: Medicaid Other | Admitting: Advanced Practice Midwife

## 2022-03-30 VITALS — BP 100/60 | Wt 164.0 lb

## 2022-03-30 DIAGNOSIS — Z3483 Encounter for supervision of other normal pregnancy, third trimester: Secondary | ICD-10-CM | POA: Insufficient documentation

## 2022-03-30 DIAGNOSIS — Z369 Encounter for antenatal screening, unspecified: Secondary | ICD-10-CM | POA: Insufficient documentation

## 2022-03-30 DIAGNOSIS — Z3A36 36 weeks gestation of pregnancy: Secondary | ICD-10-CM

## 2022-03-30 DIAGNOSIS — Z113 Encounter for screening for infections with a predominantly sexual mode of transmission: Secondary | ICD-10-CM

## 2022-03-30 DIAGNOSIS — Z3685 Encounter for antenatal screening for Streptococcus B: Secondary | ICD-10-CM

## 2022-03-30 NOTE — Progress Notes (Signed)
Routine Prenatal Care Visit  Subjective  Jennifer [name] is a 27 y.o. [phone] at [redacted]w[redacted]d being seen today for ongoing prenatal care.  She is currently monitored for the following issues for this low-risk pregnancy and has ADHD (attention deficit hyperactivity disorder); Allergic rhinitis; Marijuana use; Rh negative state in antepartum period; Iron deficiency anemia; Tobacco use during pregnancy, antepartum; Supervision of other normal pregnancy, antepartum; Fetal arrhythmia affecting pregnancy, antepartum; Seasonal allergies; and Abdominal pain during pregnancy, antepartum on their problem list.  ----------------------------------------------------------------------------------- Patient reports heartburn.  Reviewed comfort measures. Contractions: Not present. Vag. Bleeding: None.  Movement: Present. Leaking Fluid denies.  ----------------------------------------------------------------------------------- The following portions of the patient's history were reviewed and updated as appropriate: allergies, current medications, past family history, past medical history, past social history, past surgical history and problem list. Problem list updated.  Objective  Blood pressure 100/60, weight 164 lb (74.4 kg), last menstrual period 07/17/2021 Pregravid weight 140 lb (63.5 kg) Total Weight Gain 24 lb (10.9 kg) Urinalysis: Urine Protein    Urine Glucose    Fetal Status: Fetal Heart Rate (bpm): 139 Fundal Height: 36 cm Movement: Present     General:  Alert, oriented and cooperative. Patient is in no acute distress.  Skin: Skin is warm and dry. No rash noted.   Cardiovascular: Normal heart rate noted  Respiratory: Normal respiratory effort, no problems with respiration noted  Abdomen: Soft, gravid, appropriate for gestational age. Pain/Pressure: Absent     Pelvic:   GBS/aptima collected  Dilation: 1 Effacement (%): Thick Station: -3  Extremities: Normal range of motion.  Edema: None  Mental Status:  Normal mood and affect. Normal behavior. Normal judgment and thought content.   Assessment   27 y.o. [id number] at [redacted]w[redacted]d by  04/23/2022, by Last Menstrual Period presenting for routine prenatal visit  Plan   seventh Problems (from 01/30/22 to present)     Problem Noted Resolved   Supervision of other normal pregnancy, antepartum 09/16/2021 by Cleophas Dunker, [place] No   Overview Signed 03/02/2022 11:09 AM by Rod Can, CNM     Nursing Staff Provider  Office Location  [facility] Dating  By LMP  Language  English Anatomy US    Flu Vaccine   Genetic Screen  NIPS:   TDaP vaccine    Hgb A1C or  GTT Early : NA Third trimester :   Covid    LAB RESULTS   Rhogam   Blood Type B/Negative/-- (09/15 1008)   Feeding Plan  Antibody Negative (01/24 1106)  Contraception  Rubella 1.86 (09/15 1008)  Circumcision  RPR Non Reactive (01/24 1106)   Pediatrician   HBsAg Negative (09/15 1008)   Support Person  HIV Non Reactive (01/24 1106)  Prenatal Classes  Varicella     GBS  (For PCN allergy, check sensitivities)   BTL Consent     VBAC Consent NA Pap      Hgb Electro    Pelvis Tested 8#11oz CF      SMA                    Preterm labor symptoms and general obstetric precautions including but not limited to vaginal bleeding, contractions, leaking of fluid and fetal movement were reviewed in detail with the patient. Please refer to After Visit Summary for other counseling recommendations.   Return in about 1 week (around 04/06/2022) for rob.  Rod Can, CNM 03/30/2022 2:26 PM

## 2022-04-01 LAB — CERVICOVAGINAL ANCILLARY ONLY
Chlamydia: NEGATIVE
Comment: NEGATIVE
Comment: NORMAL
Neisseria Gonorrhea: NEGATIVE

## 2022-04-02 ENCOUNTER — Other Ambulatory Visit: Payer: Self-pay

## 2022-04-02 DIAGNOSIS — D509 Iron deficiency anemia, unspecified: Secondary | ICD-10-CM

## 2022-04-02 LAB — CULTURE, BETA STREP (GROUP B ONLY): Strep Gp B Culture: POSITIVE — AB

## 2022-04-05 ENCOUNTER — Inpatient Hospital Stay: Payer: Medicaid Other

## 2022-04-05 MED FILL — Iron Sucrose Inj 20 MG/ML (Fe Equiv): INTRAVENOUS | Qty: 10 | Status: AC

## 2022-04-06 ENCOUNTER — Inpatient Hospital Stay: Payer: Medicaid Other | Admitting: Oncology

## 2022-04-06 ENCOUNTER — Inpatient Hospital Stay: Payer: Medicaid Other

## 2022-04-06 ENCOUNTER — Telehealth: Payer: Self-pay | Admitting: Obstetrics and Gynecology

## 2022-04-06 NOTE — Telephone Encounter (Signed)
I contacted this patient about her 1 week ROB appointment. I left message for the patient to contact our office about scheduled appointment for 4/5 with Corliss Marcus to confirm this appointment.

## 2022-04-07 ENCOUNTER — Ambulatory Visit (INDEPENDENT_AMBULATORY_CARE_PROVIDER_SITE_OTHER): Payer: Medicaid Other | Admitting: Certified Nurse Midwife

## 2022-04-07 VITALS — BP 97/64 | HR 112 | Wt 166.2 lb

## 2022-04-07 DIAGNOSIS — Z3A37 37 weeks gestation of pregnancy: Secondary | ICD-10-CM

## 2022-04-07 DIAGNOSIS — Z3483 Encounter for supervision of other normal pregnancy, third trimester: Secondary | ICD-10-CM

## 2022-04-07 NOTE — Patient Instructions (Signed)
Braxton Hicks Contractions  Contractions of the uterus can occur throughout pregnancy, but they are not always a sign that you are in labor. You may have practice contractions called Braxton Hicks contractions. These false labor contractions are sometimes confused with true labor. What are Braxton Hicks contractions? Braxton Hicks contractions are tightening movements that occur in the muscles of the uterus before labor. Unlike true labor contractions, these contractions do not result in opening (dilation) and thinning of the lowest part of the uterus (cervix). Toward the end of pregnancy (32-34 weeks), Braxton Hicks contractions can happen more often and may become stronger. These contractions are sometimes difficult to tell apart from true labor because they can be very uncomfortable. How to tell the difference between true labor and false labor True labor Contractions last 30-70 seconds. Contractions become very regular. Discomfort is usually felt in the top of the uterus, and it spreads to the lower abdomen and low back. Contractions do not go away with walking. Contractions usually become stronger and more frequent. The cervix dilates and gets thinner. False labor Contractions are usually shorter, weaker, and farther apart than true labor contractions. Contractions are usually irregular. Contractions are often felt in the front of the lower abdomen and in the groin. Contractions may go away when you walk around or change positions while lying down. The cervix usually does not dilate or become thin. Sometimes, the only way to tell if you are in true labor is for your health care provider to look for changes in your cervix. Your health care provider will do a physical exam and may monitor your contractions. If you are in true labor, your health care provider will send you home with instructions about when to return to the hospital. You may continue to have Braxton Hicks contractions until you  go into true labor. Follow these instructions at home:  Take over-the-counter and prescription medicines only as told by your health care provider. If Braxton Hicks contractions are making you uncomfortable: Change your position from lying down or resting to walking, or change from walking to resting. Sit and rest in a tub of warm water. Drink enough fluid to keep your urine pale yellow. Dehydration may cause these contractions. Do slow and deep breathing several times an hour. Keep all follow-up visits. This is important. Contact a health care provider if: You have a fever. You have continuous pain in your abdomen. Your contractions become stronger, more regular, and closer together. You pass blood-tinged mucus. Get help right away if: You have fluid leaking or gushing from your vagina. You have bright red blood coming from your vagina. Your baby is not moving inside you as much as it used to. Summary You may have practice contractions called Braxton Hicks contractions. These false labor contractions are sometimes confused with true labor. Braxton Hicks contractions are usually shorter, weaker, farther apart, and less regular than true labor contractions. True labor contractions usually become stronger, more regular, and more frequent. Manage discomfort from Braxton Hicks contractions by changing position, resting in a warm bath, practicing deep breathing, and drinking plenty of water. Keep all follow-up visits. Contact your health care provider if your contractions become stronger, more regular, and closer together. This information is not intended to replace advice given to you by your health care provider. Make sure you discuss any questions you have with your health care provider. Document Revised: 11/05/2019 Document Reviewed: 11/05/2019 Elsevier Patient Education  2023 [employer]  

## 2022-04-07 NOTE — Progress Notes (Signed)
ROB doing well, feeling good movement. Reviewed GBS results and treatment plan. Pt verbalizes understanding. Denies any concerns/questions. Follow up 1 wk ROB.   Philip Aspen, CNM

## 2022-04-14 ENCOUNTER — Inpatient Hospital Stay: Payer: Medicaid Other | Attending: Oncology

## 2022-04-14 DIAGNOSIS — Z803 Family history of malignant neoplasm of breast: Secondary | ICD-10-CM | POA: Diagnosis not present

## 2022-04-14 DIAGNOSIS — O99013 Anemia complicating pregnancy, third trimester: Secondary | ICD-10-CM | POA: Diagnosis present

## 2022-04-14 DIAGNOSIS — Z79899 Other long term (current) drug therapy: Secondary | ICD-10-CM | POA: Insufficient documentation

## 2022-04-14 DIAGNOSIS — Z6830 Body mass index (BMI) 30.0-30.9, adult: Secondary | ICD-10-CM | POA: Diagnosis not present

## 2022-04-14 DIAGNOSIS — Z8249 Family history of ischemic heart disease and other diseases of the circulatory system: Secondary | ICD-10-CM | POA: Insufficient documentation

## 2022-04-14 DIAGNOSIS — D509 Iron deficiency anemia, unspecified: Secondary | ICD-10-CM | POA: Insufficient documentation

## 2022-04-14 DIAGNOSIS — Z801 Family history of malignant neoplasm of trachea, bronchus and lung: Secondary | ICD-10-CM | POA: Diagnosis not present

## 2022-04-14 DIAGNOSIS — Z3A00 Weeks of gestation of pregnancy not specified: Secondary | ICD-10-CM | POA: Insufficient documentation

## 2022-04-14 DIAGNOSIS — Z87891 Personal history of nicotine dependence: Secondary | ICD-10-CM | POA: Insufficient documentation

## 2022-04-14 DIAGNOSIS — Z885 Allergy status to narcotic agent status: Secondary | ICD-10-CM | POA: Diagnosis not present

## 2022-04-14 LAB — IRON AND TIBC
Iron: 53 ug/dL (ref 28–170)
Saturation Ratios: 11 % (ref 10.4–31.8)
TIBC: 473 ug/dL — ABNORMAL HIGH (ref 250–450)
UIBC: 420 ug/dL

## 2022-04-14 LAB — CBC WITH DIFFERENTIAL (CANCER CENTER ONLY)
Abs Immature Granulocytes: 0.14 10*3/uL — ABNORMAL HIGH (ref 0.00–0.07)
Basophils Absolute: 0 10*3/uL (ref 0.0–0.1)
Basophils Relative: 0 %
Eosinophils Absolute: 0.2 10*3/uL (ref 0.0–0.5)
Eosinophils Relative: 2 %
HCT: 32.3 % — ABNORMAL LOW (ref 36.0–46.0)
Hemoglobin: 10.5 g/dL — ABNORMAL LOW (ref 12.0–15.0)
Immature Granulocytes: 1 %
Lymphocytes Relative: 16 %
Lymphs Abs: 1.9 10*3/uL (ref 0.7–4.0)
MCH: 27.7 pg (ref 26.0–34.0)
MCHC: 32.5 g/dL (ref 30.0–36.0)
MCV: 85.2 fL (ref 80.0–100.0)
Monocytes Absolute: 0.8 10*3/uL (ref 0.1–1.0)
Monocytes Relative: 7 %
Neutro Abs: 8.7 10*3/uL — ABNORMAL HIGH (ref 1.7–7.7)
Neutrophils Relative %: 74 %
Platelet Count: 223 10*3/uL (ref 150–400)
RBC: 3.79 MIL/uL — ABNORMAL LOW (ref 3.87–5.11)
RDW: 16.1 % — ABNORMAL HIGH (ref 11.5–15.5)
WBC Count: 11.8 10*3/uL — ABNORMAL HIGH (ref 4.0–10.5)
nRBC: 0 % (ref 0.0–0.2)

## 2022-04-14 LAB — FERRITIN: Ferritin: 23 ng/mL (ref 11–307)

## 2022-04-14 MED FILL — Iron Sucrose Inj 20 MG/ML (Fe Equiv): INTRAVENOUS | Qty: 10 | Status: AC

## 2022-04-15 ENCOUNTER — Inpatient Hospital Stay: Payer: Medicaid Other

## 2022-04-15 ENCOUNTER — Encounter: Payer: Self-pay | Admitting: Oncology

## 2022-04-15 ENCOUNTER — Inpatient Hospital Stay (HOSPITAL_BASED_OUTPATIENT_CLINIC_OR_DEPARTMENT_OTHER): Payer: Medicaid Other | Admitting: Oncology

## 2022-04-15 VITALS — BP 110/75 | HR 113 | Temp 97.7°F | Resp 16 | Ht 62.0 in | Wt 166.6 lb

## 2022-04-15 DIAGNOSIS — D509 Iron deficiency anemia, unspecified: Secondary | ICD-10-CM | POA: Diagnosis not present

## 2022-04-15 DIAGNOSIS — O99013 Anemia complicating pregnancy, third trimester: Secondary | ICD-10-CM | POA: Diagnosis not present

## 2022-04-15 NOTE — Progress Notes (Signed)
[place]  [phone] [phone]  ID: Jennifer [name] OB: [birth year]  MR#: [id number]  [id number]  Patient Care Team: Berline Chough, MD as PCP - General (Internal Medicine)  CHIEF COMPLAINT: Iron deficiency anemia in pregnancy.  INTERVAL HISTORY: Patient returns to clinic today for repeat laboratory work, further evaluation, and consideration of additional IV iron.  She currently feels well.  She does not complain of any increasing weakness and fatigue.  She denies any further headache.  She has no neurologic complaints.  She denies any recent fevers or illnesses.  She has a good appetite and is gaining weight appropriately.  She has no chest pain, shortness of breath, cough, or hemoptysis.  She denies any nausea, vomiting, constipation, or diarrhea.  She has no urinary complaints.  Patient offers no specific complaints today.  REVIEW OF SYSTEMS:   Review of Systems  Constitutional: Negative.  Negative for malaise/fatigue and weight loss.  Respiratory: Negative.  Negative for cough, hemoptysis and shortness of breath.   Cardiovascular: Negative.  Negative for chest pain and leg swelling.  Gastrointestinal: Negative.  Negative for abdominal pain, blood in stool and melena.  Genitourinary: Negative.  Negative for hematuria.  Musculoskeletal: Negative.  Negative for back pain.  Skin: Negative.  Negative for rash.  Neurological: Negative.  Negative for dizziness, focal weakness, weakness and headaches.  Psychiatric/Behavioral: Negative.  The patient is not nervous/anxious.     As per HPI. Otherwise, a complete review of systems is negative.  PAST MEDICAL HISTORY: Past Medical History:  Diagnosis Date   ADHD (attention deficit hyperactivity disorder), combined type 10/27/2017   by history/ Milton Ferguson LCSW   Adjustment disorder with mixed anxiety and depressed mood 10/27/2017   Milton Ferguson LCSW   Anemia    Anxiety     Environmental and seasonal allergies    Lost custody of children 02/28/2018   Loss custody of 2 children/per Donnal Moat CNM    PAST SURGICAL HISTORY: Past Surgical History:  Procedure Laterality Date   WISDOM TOOTH EXTRACTION     age 74 - all four    FAMILY HISTORY: Family History  Problem Relation Age of Onset   Hypertension Maternal Grandmother    Breast cancer Other    Cancer Other 39       lung    ADVANCED DIRECTIVES (Y/N):  N  HEALTH MAINTENANCE: Social History   Tobacco Use   Smoking status: Former    Packs/day: 1    Types: Cigarettes    Quit date: 04/28/2015    Years since quitting: 6.9   Smokeless tobacco: Never   Tobacco comments:    Not since 2021  Vaping Use   Vaping Use: Former  Substance Use Topics   Alcohol use: Not Currently   Drug use: Not Currently    Types: Marijuana     Colonoscopy:  PAP:  Bone density:  Lipid panel:  Allergies  Allergen Reactions   Fruit Extracts Hives    Pt is allergic to all fruits especially avocado's and fruits with seeds.   Hydrocodone Hives and Itching   Vicodin [Hydrocodone-Acetaminophen] Itching    Current Outpatient Medications  Medication Sig Dispense Refill   omeprazole (PRILOSEC) 20 MG capsule Take 1 capsule (20 mg total) by mouth 2 (two) times daily before a meal. 60 capsule 1   Prenatal Vit-Fe Fumarate-FA (PRENATAL VITAMINS) 28-0.8 MG TABS Take 1 tablet by mouth daily.     sertraline (ZOLOFT) 50 MG tablet TAKE 1 TABLET (  50 MG TOTAL) BY MOUTH DAILY. START BY TAKING 25 MG (HALF A TABLET) DAILY FOR 7 DAYS, THEN INCREASE TO 50MG  (1 TABLET) DAILY 90 tablet 1   SUMAtriptan (IMITREX) 50 MG tablet Take 1 tablet (50 mg total) by mouth once as needed for up to 1 dose for migraine. May repeat in 2 hours if headache persists or recurs. 20 tablet 0   ondansetron (ZOFRAN-ODT) 4 MG disintegrating tablet Take 1 tablet (4 mg total) by mouth every 8 (eight) hours as needed for nausea or vomiting. (Patient not taking:  Reported on 04/15/2022) 20 tablet 0   No current facility-administered medications for this visit.    OBJECTIVE: Vitals:   04/15/22 1440  BP: 110/75  Pulse: (!) 113  Resp: 16  Temp: 97.7 F (36.5 C)  SpO2: 100%     Body mass index is 30.47 kg/m.    ECOG FS:0 - Asymptomatic  General: Well-developed, well-nourished, no acute distress. Eyes: Pink conjunctiva, anicteric sclera. HEENT: Normocephalic, moist mucous membranes. Lungs: No audible wheezing or coughing. Heart: Regular rate and rhythm. Abdomen: Appears appropriate for gestational age.   Musculoskeletal: No edema, cyanosis, or clubbing. Neuro: Alert, answering all questions appropriately. Cranial nerves grossly intact. Skin: No rashes or petechiae noted. Psych: Normal affect.  LAB RESULTS:  Lab Results  Component Value Date   NA 134 (L) 01/30/2022   K 3.6 01/30/2022   CL 106 01/30/2022   CO2 23 01/30/2022   GLUCOSE 91 01/30/2022   BUN 8 01/30/2022   CREATININE 0.42 (L) 01/30/2022   CALCIUM 7.8 (L) 01/30/2022   PROT 5.8 (L) 01/30/2022   ALBUMIN 2.6 (L) 01/30/2022   AST 11 (L) 01/30/2022   ALT 8 01/30/2022   ALKPHOS 57 01/30/2022   BILITOT 0.2 (L) 01/30/2022   GFRNONAA >60 01/30/2022   GFRAA >60 05/23/2016    Lab Results  Component Value Date   WBC 11.8 (H) 04/14/2022   NEUTROABS 8.7 (H) 04/14/2022   HGB 10.5 (L) 04/14/2022   HCT 32.3 (L) 04/14/2022   MCV 85.2 04/14/2022   PLT 223 04/14/2022   Lab Results  Component Value Date   FERRITIN 23 04/14/2022     STUDIES: No results found.  ASSESSMENT: Iron deficiency anemia in pregnancy.  PLAN:    Iron deficiency anemia in pregnancy: Essentially resolved.  Patient's hemoglobin has improved to 10.5 and her iron stores are now within normal limits.  She is also asymptomatic.  She does not require additional IV Venofer today.  She last received treatment on February 24, 2022.  No further follow-up has been scheduled.  Please refer patient back if there are  any questions or concerns.  Pregnancy: Patient's estimated date of delivery for her daughter is April 23, 2022.   I spent a total of 20 minutes reviewing chart data, face-to-face evaluation with the patient, counseling and coordination of care as detailed above.   Patient expressed understanding and was in agreement with this plan. She also understands that She can call clinic at any time with any questions, concerns, or complaints.    Lloyd Huger, MD   04/15/2022 2:58 PM

## 2022-04-16 ENCOUNTER — Ambulatory Visit (INDEPENDENT_AMBULATORY_CARE_PROVIDER_SITE_OTHER): Payer: Medicaid Other | Admitting: Medical

## 2022-04-16 VITALS — BP 91/61 | HR 114 | Wt 162.0 lb

## 2022-04-16 DIAGNOSIS — Z3A39 39 weeks gestation of pregnancy: Secondary | ICD-10-CM

## 2022-04-16 DIAGNOSIS — O99820 Streptococcus B carrier state complicating pregnancy: Secondary | ICD-10-CM | POA: Insufficient documentation

## 2022-04-16 DIAGNOSIS — O368330 Maternal care for abnormalities of the fetal heart rate or rhythm, third trimester, not applicable or unspecified: Secondary | ICD-10-CM

## 2022-04-16 DIAGNOSIS — O99013 Anemia complicating pregnancy, third trimester: Secondary | ICD-10-CM

## 2022-04-16 DIAGNOSIS — Z3480 Encounter for supervision of other normal pregnancy, unspecified trimester: Secondary | ICD-10-CM

## 2022-04-16 DIAGNOSIS — D649 Anemia, unspecified: Secondary | ICD-10-CM

## 2022-04-16 DIAGNOSIS — O360130 Maternal care for anti-D [Rh] antibodies, third trimester, not applicable or unspecified: Secondary | ICD-10-CM

## 2022-04-16 DIAGNOSIS — O368390 Maternal care for abnormalities of the fetal heart rate or rhythm, unspecified trimester, not applicable or unspecified: Secondary | ICD-10-CM

## 2022-04-16 DIAGNOSIS — O26899 Other specified pregnancy related conditions, unspecified trimester: Secondary | ICD-10-CM

## 2022-04-16 NOTE — Progress Notes (Signed)
   PRENATAL VISIT NOTE  Subjective:  Jennifer [name] is a 27 y.o. [phone] at [redacted]w[redacted]d being seen today for ongoing prenatal care.  She is currently monitored for the following issues for this high-risk pregnancy and has ADHD (attention deficit hyperactivity disorder); Allergic rhinitis; Marijuana use; Rh negative state in antepartum period; Iron deficiency anemia; Tobacco use during pregnancy, antepartum; Supervision of other normal pregnancy, antepartum; Fetal arrhythmia affecting pregnancy, antepartum; Seasonal allergies; and Abdominal pain during pregnancy, antepartum on their problem list.  Patient reports  pelvic pressure .  Contractions: Irritability. Vag. Bleeding: None.  Movement: Present. Denies leaking of fluid.   The following portions of the patient's history were reviewed and updated as appropriate: allergies, current medications, past family history, past medical history, past social history, past surgical history and problem list.   Objective:   Vitals:   04/16/22 1048  BP: 91/61  Pulse: (!) 114  Weight: 162 lb (73.5 kg)    Fetal Status: Fetal Heart Rate (bpm): 138 Fundal Height: 38 cm Movement: Present  Presentation: Vertex  General:  Alert, oriented and cooperative. Patient is in no acute distress.  Skin: Skin is warm and dry. No rash noted.   Cardiovascular: Normal heart rate noted  Respiratory: Normal respiratory effort, no problems with respiration noted  Abdomen: Soft, gravid, appropriate for gestational age.  Pain/Pressure: Present     Pelvic: Cervical exam performed in the presence of a chaperone Dilation: 1.5 Effacement (%): 50 Station: -3  Extremities: Normal range of motion.  Edema: Trace  Mental Status: Normal mood and affect. Normal behavior. Normal judgment and thought content.   Assessment and Plan:  Pregnancy: [id number] at [redacted]w[redacted]d 1. Supervision of other normal pregnancy, antepartum - Doing well, few BH contractions and increased pressure x 2 days   2. Rh  negative state in antepartum period - Rhogam was given 02/03/22  3. Fetal arrhythmia affecting pregnancy, antepartum - Resolved at 28 weeks   4. Anemia in pregnancy, third trimester - Has had IV Venofer   5. Group B strep in pregnancy   6. [redacted] weeks gestation of pregnancy  Term labor symptoms and general obstetric precautions including but not limited to vaginal bleeding, contractions, leaking of fluid and fetal movement were reviewed in detail with the patient. Please refer to After Visit Summary for other counseling recommendations.   No follow-ups on file.  Future Appointments  Date Time Provider Department Center  04/20/2022  9:55 AM Hildred Laser, MD [facility] None  05/11/2022  2:30 PM Jomarie Longs, MD [facility] None    Vonzella Nipple, PA-C

## 2022-04-20 ENCOUNTER — Ambulatory Visit (INDEPENDENT_AMBULATORY_CARE_PROVIDER_SITE_OTHER): Payer: Medicaid Other | Admitting: Obstetrics and Gynecology

## 2022-04-20 ENCOUNTER — Encounter: Payer: Self-pay | Admitting: Obstetrics and Gynecology

## 2022-04-20 VITALS — BP 99/63 | HR 106 | Wt 162.7 lb

## 2022-04-20 DIAGNOSIS — O99330 Smoking (tobacco) complicating pregnancy, unspecified trimester: Secondary | ICD-10-CM

## 2022-04-20 DIAGNOSIS — F909 Attention-deficit hyperactivity disorder, unspecified type: Secondary | ICD-10-CM

## 2022-04-20 DIAGNOSIS — O99013 Anemia complicating pregnancy, third trimester: Secondary | ICD-10-CM

## 2022-04-20 DIAGNOSIS — D649 Anemia, unspecified: Secondary | ICD-10-CM

## 2022-04-20 DIAGNOSIS — O99820 Streptococcus B carrier state complicating pregnancy: Secondary | ICD-10-CM

## 2022-04-20 DIAGNOSIS — O99323 Drug use complicating pregnancy, third trimester: Secondary | ICD-10-CM

## 2022-04-20 DIAGNOSIS — D509 Iron deficiency anemia, unspecified: Secondary | ICD-10-CM

## 2022-04-20 DIAGNOSIS — Z3490 Encounter for supervision of normal pregnancy, unspecified, unspecified trimester: Secondary | ICD-10-CM

## 2022-04-20 DIAGNOSIS — Z87891 Personal history of nicotine dependence: Secondary | ICD-10-CM

## 2022-04-20 DIAGNOSIS — Z3A39 39 weeks gestation of pregnancy: Secondary | ICD-10-CM

## 2022-04-20 DIAGNOSIS — O99333 Smoking (tobacco) complicating pregnancy, third trimester: Secondary | ICD-10-CM

## 2022-04-20 DIAGNOSIS — O99513 Diseases of the respiratory system complicating pregnancy, third trimester: Secondary | ICD-10-CM

## 2022-04-20 DIAGNOSIS — J309 Allergic rhinitis, unspecified: Secondary | ICD-10-CM

## 2022-04-20 DIAGNOSIS — F1290 Cannabis use, unspecified, uncomplicated: Secondary | ICD-10-CM

## 2022-04-20 DIAGNOSIS — Z3483 Encounter for supervision of other normal pregnancy, third trimester: Secondary | ICD-10-CM

## 2022-04-20 DIAGNOSIS — O1203 Gestational edema, third trimester: Secondary | ICD-10-CM

## 2022-04-20 DIAGNOSIS — O99343 Other mental disorders complicating pregnancy, third trimester: Secondary | ICD-10-CM

## 2022-04-20 LAB — POCT URINALYSIS DIPSTICK OB
Bilirubin, UA: NEGATIVE
Blood, UA: NEGATIVE
Glucose, UA: NEGATIVE
Ketones, UA: NEGATIVE
Leukocytes, UA: NEGATIVE
Nitrite, UA: NEGATIVE
Spec Grav, UA: 1.02 (ref 1.010–1.025)
Urobilinogen, UA: 0.2 E.U./dL
pH, UA: 6 (ref 5.0–8.0)

## 2022-04-20 NOTE — Progress Notes (Addendum)
ROB: Patient is a 27 y.o. [id number] at [redacted]w[redacted]d who presents for routine OB care.  Pregnancy is complicated by ADHD (attention deficit hyperactivity disorder); Allergic rhinitis; Marijuana use; Rh negative state in antepartum period; Iron deficiency anemia; Tobacco use during pregnancy, antepartum; Supervision of other normal pregnancy, antepartum; Fetal arrhythmia affecting pregnancy, antepartum; and Group B streptococcal carriage complicating pregnancy on their problem list.  Patient has complaints of swelling in her feet, but notes she has been in the process of moving.  Does desire membrane sweep today. Cervix still thick and very posterior, discussed less likely to be successful today, may require serial sweeps. Discussed option of IOL if no delivery by 41 weeks, IOL scheduled for 3/15 (at 40.3 weeks as no other dates available until after 41 weeks).

## 2022-04-20 NOTE — Progress Notes (Signed)
ROB [redacted]w[redacted]d: She is doing well. She has been having some pressure and Braxton Hick's contractions. She wants her membranes swept today. She reports good fetal movement.

## 2022-04-26 ENCOUNTER — Encounter: Payer: Self-pay | Admitting: Obstetrics

## 2022-04-26 ENCOUNTER — Telehealth: Payer: Self-pay

## 2022-04-26 ENCOUNTER — Inpatient Hospital Stay
Admission: EM | Admit: 2022-04-26 | Discharge: 2022-04-28 | DRG: 807 | Disposition: A | Discharge status: Home / Self Care | Payer: Medicaid Other | Attending: Obstetrics and Gynecology | Admitting: Obstetrics and Gynecology

## 2022-04-26 DIAGNOSIS — Z8249 Family history of ischemic heart disease and other diseases of the circulatory system: Secondary | ICD-10-CM

## 2022-04-26 DIAGNOSIS — Z3A40 40 weeks gestation of pregnancy: Secondary | ICD-10-CM | POA: Diagnosis not present

## 2022-04-26 DIAGNOSIS — Z87891 Personal history of nicotine dependence: Secondary | ICD-10-CM

## 2022-04-26 DIAGNOSIS — Z6791 Unspecified blood type, Rh negative: Secondary | ICD-10-CM | POA: Diagnosis not present

## 2022-04-26 DIAGNOSIS — D62 Acute posthemorrhagic anemia: Secondary | ICD-10-CM | POA: Diagnosis not present

## 2022-04-26 DIAGNOSIS — O480 Post-term pregnancy: Principal | ICD-10-CM | POA: Diagnosis present

## 2022-04-26 DIAGNOSIS — Z30017 Encounter for initial prescription of implantable subdermal contraceptive: Secondary | ICD-10-CM | POA: Diagnosis not present

## 2022-04-26 DIAGNOSIS — O9902 Anemia complicating childbirth: Secondary | ICD-10-CM | POA: Diagnosis present

## 2022-04-26 DIAGNOSIS — O99824 Streptococcus B carrier state complicating childbirth: Secondary | ICD-10-CM | POA: Diagnosis present

## 2022-04-26 DIAGNOSIS — O99820 Streptococcus B carrier state complicating pregnancy: Secondary | ICD-10-CM | POA: Diagnosis not present

## 2022-04-26 DIAGNOSIS — Z3490 Encounter for supervision of normal pregnancy, unspecified, unspecified trimester: Secondary | ICD-10-CM | POA: Diagnosis present

## 2022-04-26 DIAGNOSIS — O26893 Other specified pregnancy related conditions, third trimester: Secondary | ICD-10-CM | POA: Diagnosis present

## 2022-04-26 DIAGNOSIS — O9903 Anemia complicating the puerperium: Secondary | ICD-10-CM | POA: Diagnosis not present

## 2022-04-26 DIAGNOSIS — Z3046 Encounter for surveillance of implantable subdermal contraceptive: Secondary | ICD-10-CM | POA: Diagnosis not present

## 2022-04-26 LAB — CBC
HCT: 31.3 % — ABNORMAL LOW (ref 36.0–46.0)
Hemoglobin: 10.2 g/dL — ABNORMAL LOW (ref 12.0–15.0)
MCH: 27.9 pg (ref 26.0–34.0)
MCHC: 32.6 g/dL (ref 30.0–36.0)
MCV: 85.8 fL (ref 80.0–100.0)
Platelets: 189 10*3/uL (ref 150–400)
RBC: 3.65 MIL/uL — ABNORMAL LOW (ref 3.87–5.11)
RDW: 15.8 % — ABNORMAL HIGH (ref 11.5–15.5)
WBC: 11.3 10*3/uL — ABNORMAL HIGH (ref 4.0–10.5)
nRBC: 0 % (ref 0.0–0.2)

## 2022-04-26 LAB — TYPE AND SCREEN: Antibody Screen: POSITIVE

## 2022-04-26 MED ORDER — LIDOCAINE HCL (PF) 1 % IJ SOLN
INTRAMUSCULAR | Status: AC
  Filled 2022-04-26: qty 30

## 2022-04-26 MED ORDER — LACTATED RINGERS IV SOLN: INTRAVENOUS | Status: DC

## 2022-04-26 MED ORDER — OXYTOCIN 10 UNIT/ML IJ SOLN
INTRAMUSCULAR | Status: AC
  Filled 2022-04-26: qty 2

## 2022-04-26 MED ORDER — OXYTOCIN-SODIUM CHLORIDE 30-0.9 UT/500ML-% IV SOLN
2.5000 [IU]/h | INTRAVENOUS | Status: DC
  Administered 2022-04-27: 2.5 [IU]/h via INTRAVENOUS

## 2022-04-26 MED ORDER — SODIUM CHLORIDE 0.9 % IV SOLN
5.0000 10*6.[IU] | Freq: Once | INTRAVENOUS | Status: AC
  Administered 2022-04-27: 5 10*6.[IU] via INTRAVENOUS
  Filled 2022-04-26: qty 5

## 2022-04-26 MED ORDER — MISOPROSTOL 200 MCG PO TABS
ORAL_TABLET | ORAL | Status: AC
  Filled 2022-04-26: qty 3

## 2022-04-26 MED ORDER — OXYTOCIN-SODIUM CHLORIDE 30-0.9 UT/500ML-% IV SOLN
1.0000 m[IU]/min | INTRAVENOUS | Status: DC
  Administered 2022-04-27: 4 m[IU]/min via INTRAVENOUS
  Administered 2022-04-27: 2 m[IU]/min via INTRAVENOUS
  Administered 2022-04-27: 6 m[IU]/min via INTRAVENOUS
  Filled 2022-04-26 (×2): qty 500

## 2022-04-26 MED ORDER — ACETAMINOPHEN 325 MG PO TABS: 650.0000 mg | ORAL_TABLET | ORAL | Status: DC | PRN

## 2022-04-26 MED ORDER — PENICILLIN G POT IN DEXTROSE 60000 UNIT/ML IV SOLN
3.0000 10*6.[IU] | INTRAVENOUS | Status: DC
  Administered 2022-04-27 (×2): 3 10*6.[IU] via INTRAVENOUS
  Filled 2022-04-26 (×5): qty 50

## 2022-04-26 MED ORDER — LIDOCAINE HCL (PF) 1 % IJ SOLN: 30.0000 mL | INTRAMUSCULAR | Status: DC | PRN

## 2022-04-26 MED ORDER — SOD CITRATE-CITRIC ACID 500-334 MG/5ML PO SOLN: 30.0000 mL | ORAL | Status: DC | PRN

## 2022-04-26 MED ORDER — ONDANSETRON HCL 4 MG/2ML IJ SOLN: 4.0000 mg | Freq: Four times a day (QID) | INTRAMUSCULAR | Status: DC | PRN

## 2022-04-26 MED ORDER — TERBUTALINE SULFATE 1 MG/ML IJ SOLN: 0.2500 mg | Freq: Once | INTRAMUSCULAR | Status: DC | PRN

## 2022-04-26 MED ORDER — AMMONIA AROMATIC IN INHA
RESPIRATORY_TRACT | Status: AC
  Filled 2022-04-26: qty 10

## 2022-04-26 MED ORDER — LACTATED RINGERS IV SOLN
500.0000 mL | INTRAVENOUS | Status: DC | PRN
  Administered 2022-04-27: 500 mL via INTRAVENOUS

## 2022-04-26 MED ORDER — FENTANYL CITRATE (PF) 100 MCG/2ML IJ SOLN
50.0000 ug | INTRAMUSCULAR | Status: DC | PRN
  Administered 2022-04-27 (×2): 100 ug via INTRAVENOUS
  Filled 2022-04-26 (×2): qty 2

## 2022-04-26 MED ORDER — MISOPROSTOL 25 MCG QUARTER TABLET
50.0000 ug | ORAL_TABLET | ORAL | Status: DC | PRN
  Administered 2022-04-26 – 2022-04-27 (×2): 50 ug via VAGINAL
  Filled 2022-04-26: qty 1

## 2022-04-26 MED ORDER — OXYTOCIN BOLUS FROM INFUSION
333.0000 mL | Freq: Once | INTRAVENOUS | Status: AC
  Administered 2022-04-27: 333 mL via INTRAVENOUS

## 2022-04-26 NOTE — Addendum Note (Signed)
Addended by: Mirna Mires on: 04/26/2022 07:10 PM   Modules accepted: Orders

## 2022-04-26 NOTE — Telephone Encounter (Signed)
Patient is calling because her induction has been pushed back. She has already made arrangement for her kids and she has family coming in to help her. This is a big inconvenience to her and she would like to talk to somebody, she does not want to wait until next week. Please advise

## 2022-04-26 NOTE — H&P (Signed)
Jennifer [name] is a 27 y.o. female presenting for elective induction at 40 weeks 3 days gestation.jShe is a [place] [address] OB History     Gravida  7   Para  3   Term  3   Preterm      AB  2   Living  3      SAB  1   IAB  1   Ectopic      Multiple  0   Live Births  3        Obstetric Comments  IOL for postdates with G1        Past Medical History:  Diagnosis Date   ADHD (attention deficit hyperactivity disorder), combined type 10/27/2017   by history/ Kathreen Cosier LCSW   Adjustment disorder with mixed anxiety and depressed mood 10/27/2017   Kathreen Cosier LCSW   Anemia    Anxiety    Environmental and seasonal allergies    Lost custody of children 02/28/2018   Loss custody of 2 children/per Arnetha Courser CNM   Past Surgical History:  Procedure Laterality Date   WISDOM TOOTH EXTRACTION     age 76 - all four   Family History: family history includes Breast cancer in an other family member; Cancer (age of onset: 41) in an other family member; Hypertension in her maternal grandmother. Social History:  reports that she quit smoking about 7 years ago. Her smoking use included cigarettes. She smoked an average of 1 pack per day. She has never used smokeless tobacco. She reports that she does not currently use alcohol. She reports that she does not currently use drugs after having used the following drugs: Marijuana.     Maternal Diabetes: No Genetic Screening: Normal Maternal Ultrasounds/Referrals: Normal Fetal Ultrasounds or other Referrals:  None Maternal Substance Abuse:  Yes:  Type: Smoker, Marijuana Significant Maternal Medications:  None Significant Maternal Lab Results:  Group B Strep positive and Rh negative Number of Prenatal Visits:greater than 3 verified prenatal visits Other Comments:  None  Review of Systems  Constitutional: Negative.   HENT: Negative.    Eyes: Negative.   Cardiovascular: Negative.   Gastrointestinal: Negative.    Endocrine: Negative.   Genitourinary: Negative.   Allergic/Immunologic: Negative.   Neurological: Negative.   Hematological: Negative.   Psychiatric/Behavioral: Negative.     History   Last menstrual period 07/17/2021, unknown if currently breastfeeding. Maternal Exam:  Uterine Assessment: Contraction frequency is rare.  Abdomen: Fetal presentation: vertex Introitus: Normal vulva. Normal vagina.  Pelvis: adequate for delivery.   Cervix: Cervix evaluated by digital exam.     Physical Exam Constitutional:      Appearance: Normal appearance.  Cardiovascular:     Rate and Rhythm: Normal rate and regular rhythm.     Pulses: Normal pulses.     Heart sounds: Normal heart sounds.  Pulmonary:     Effort: Pulmonary effort is normal.     Breath sounds: Normal breath sounds.  Abdominal:     Comments: Gravid abdomen No contractions palpated, nor seen via toco  Genitourinary:    General: Normal vulva.     Rectum: Normal.  Musculoskeletal:        General: Normal range of motion.     Cervical back: Normal range of motion and neck supple.  Skin:    General: Skin is warm and dry.  Neurological:     General: No focal deficit present.     Mental Status: She is alert and oriented  to person, place, and time.  Psychiatric:        Mood and Affect: Mood normal.        Behavior: Behavior normal.    FHTS: 130 baseline, moderate variability, accels present, decels absent.  SVE: FT/thick/high, cervix is posterior, moderately soft Prenatal labs: ABO, Rh: --/--/PENDING (04/15 2037) Antibody: PENDING (04/15 2037) Rubella: 1.86 (09/15 1008) RPR: Non Reactive (01/24 1106)  HBsAg: Negative (09/15 1008)  HIV: Non Reactive (01/24 1106)  GBS: Positive/-- (03/19 1434)   Assessment/Plan: [id number] Para3, for IOL at 40 weeks 3 days Category 1 FHTs GBS+ Rh negative Bishop score:1   Routine admission orders and labs Will start PCN for her GBS + status once contracting regularly and with some  cervical change. EFM Cytotec placed vaginally. UDS   Recheck in 4 hours and RN will redosePRN.  She plans an epidural once more dilated. IV access  Dr. Logan Bores aware of this admission and agrees with the POC.    Mirna Mires 04/26/2022, 9:57 PM

## 2022-04-27 ENCOUNTER — Other Ambulatory Visit: Payer: Self-pay

## 2022-04-27 ENCOUNTER — Inpatient Hospital Stay: Payer: Medicaid Other | Admitting: Anesthesiology

## 2022-04-27 DIAGNOSIS — O480 Post-term pregnancy: Principal | ICD-10-CM

## 2022-04-27 DIAGNOSIS — O99820 Streptococcus B carrier state complicating pregnancy: Secondary | ICD-10-CM | POA: Diagnosis not present

## 2022-04-27 DIAGNOSIS — O9903 Anemia complicating the puerperium: Secondary | ICD-10-CM | POA: Diagnosis not present

## 2022-04-27 DIAGNOSIS — D62 Acute posthemorrhagic anemia: Secondary | ICD-10-CM

## 2022-04-27 DIAGNOSIS — Z3A40 40 weeks gestation of pregnancy: Secondary | ICD-10-CM

## 2022-04-27 LAB — TYPE AND SCREEN: ABO/RH(D): B NEG

## 2022-04-27 LAB — URINE DRUG SCREEN, QUALITATIVE (ARMC ONLY)
Amphetamines, Ur Screen: NOT DETECTED
Barbiturates, Ur Screen: NOT DETECTED
Benzodiazepine, Ur Scrn: NOT DETECTED
Cannabinoid 50 Ng, Ur ~~LOC~~: POSITIVE — AB
Cocaine Metabolite,Ur ~~LOC~~: NOT DETECTED
MDMA (Ecstasy)Ur Screen: NOT DETECTED
Methadone Scn, Ur: NOT DETECTED
Opiate, Ur Screen: NOT DETECTED
Phencyclidine (PCP) Ur S: NOT DETECTED
Tricyclic, Ur Screen: NOT DETECTED

## 2022-04-27 LAB — RPR: RPR Ser Ql: NONREACTIVE

## 2022-04-27 MED ORDER — EPHEDRINE 5 MG/ML INJ: 10.0000 mg | INTRAVENOUS | Status: DC | PRN

## 2022-04-27 MED ORDER — ACETAMINOPHEN 500 MG PO TABS
1000.0000 mg | ORAL_TABLET | Freq: Four times a day (QID) | ORAL | Status: DC
  Administered 2022-04-28 (×2): 1000 mg via ORAL
  Filled 2022-04-27 (×2): qty 2

## 2022-04-27 MED ORDER — ONDANSETRON HCL 4 MG PO TABS: 4.0000 mg | ORAL_TABLET | ORAL | Status: DC | PRN

## 2022-04-27 MED ORDER — DIBUCAINE (PERIANAL) 1 % EX OINT: 1.0000 | TOPICAL_OINTMENT | CUTANEOUS | Status: DC | PRN

## 2022-04-27 MED ORDER — COCONUT OIL OIL: 1.0000 | TOPICAL_OIL | Status: DC | PRN

## 2022-04-27 MED ORDER — PRENATAL MULTIVITAMIN CH
1.0000 | ORAL_TABLET | Freq: Every day | ORAL | Status: DC
  Administered 2022-04-28: 1 via ORAL
  Filled 2022-04-27: qty 1

## 2022-04-27 MED ORDER — LIDOCAINE-EPINEPHRINE (PF) 1.5 %-1:200000 IJ SOLN
INTRAMUSCULAR | Status: DC | PRN
  Administered 2022-04-27: 3 mL via EPIDURAL

## 2022-04-27 MED ORDER — LACTATED RINGERS IV SOLN: 500.0000 mL | Freq: Once | INTRAVENOUS | Status: DC

## 2022-04-27 MED ORDER — PHENYLEPHRINE 80 MCG/ML (10ML) SYRINGE FOR IV PUSH (FOR BLOOD PRESSURE SUPPORT): 80.0000 ug | PREFILLED_SYRINGE | INTRAVENOUS | Status: DC | PRN

## 2022-04-27 MED ORDER — IBUPROFEN 600 MG PO TABS
600.0000 mg | ORAL_TABLET | Freq: Four times a day (QID) | ORAL | Status: DC
  Administered 2022-04-27 – 2022-04-28 (×2): 600 mg via ORAL
  Filled 2022-04-27: qty 1

## 2022-04-27 MED ORDER — SIMETHICONE 80 MG PO CHEW: 80.0000 mg | CHEWABLE_TABLET | ORAL | Status: DC | PRN

## 2022-04-27 MED ORDER — MISOPROSTOL 25 MCG QUARTER TABLET
ORAL_TABLET | ORAL | Status: AC
  Filled 2022-04-27: qty 2

## 2022-04-27 MED ORDER — DIPHENHYDRAMINE HCL 50 MG/ML IJ SOLN: 12.5000 mg | INTRAMUSCULAR | Status: DC | PRN

## 2022-04-27 MED ORDER — DIPHENHYDRAMINE HCL 25 MG PO CAPS: 25.0000 mg | ORAL_CAPSULE | Freq: Four times a day (QID) | ORAL | Status: DC | PRN

## 2022-04-27 MED ORDER — WITCH HAZEL-GLYCERIN EX PADS: 1.0000 | MEDICATED_PAD | CUTANEOUS | Status: DC | PRN

## 2022-04-27 MED ORDER — IBUPROFEN 600 MG PO TABS
ORAL_TABLET | ORAL | Status: AC
  Filled 2022-04-27: qty 1

## 2022-04-27 MED ORDER — ONDANSETRON HCL 4 MG/2ML IJ SOLN: 4.0000 mg | INTRAMUSCULAR | Status: DC | PRN

## 2022-04-27 MED ORDER — FENTANYL-BUPIVACAINE-NACL 0.5-0.125-0.9 MG/250ML-% EP SOLN
12.0000 mL/h | EPIDURAL | Status: DC | PRN
  Administered 2022-04-27: 10 mL/h via EPIDURAL

## 2022-04-27 MED ORDER — LIDOCAINE HCL (PF) 1 % IJ SOLN
INTRAMUSCULAR | Status: DC | PRN
  Administered 2022-04-27: 3 mL

## 2022-04-27 MED ORDER — MISOPROSTOL 50MCG HALF TABLET
ORAL_TABLET | ORAL | Status: AC
  Filled 2022-04-27: qty 1

## 2022-04-27 MED ORDER — MISOPROSTOL 25 MCG QUARTER TABLET
25.0000 ug | ORAL_TABLET | Freq: Once | ORAL | Status: AC
  Administered 2022-04-27: 25 ug via ORAL

## 2022-04-27 MED ORDER — BUPIVACAINE HCL (PF) 0.25 % IJ SOLN
INTRAMUSCULAR | Status: DC | PRN
  Administered 2022-04-27: 4 mL via EPIDURAL
  Administered 2022-04-27: 2 mL via EPIDURAL

## 2022-04-27 MED ORDER — FENTANYL-BUPIVACAINE-NACL 0.5-0.125-0.9 MG/250ML-% EP SOLN
EPIDURAL | Status: AC
  Filled 2022-04-27: qty 250

## 2022-04-27 MED ORDER — DOCUSATE SODIUM 100 MG PO CAPS
100.0000 mg | ORAL_CAPSULE | Freq: Two times a day (BID) | ORAL | Status: DC
  Administered 2022-04-27 – 2022-04-28 (×2): 100 mg via ORAL
  Filled 2022-04-27 (×2): qty 1

## 2022-04-27 MED ORDER — ZOLPIDEM TARTRATE 5 MG PO TABS
5.0000 mg | ORAL_TABLET | Freq: Every evening | ORAL | Status: DC | PRN
  Administered 2022-04-27: 5 mg via ORAL
  Filled 2022-04-27: qty 1

## 2022-04-27 MED ORDER — BENZOCAINE-MENTHOL 20-0.5 % EX AERO: 1.0000 | INHALATION_SPRAY | CUTANEOUS | Status: DC | PRN

## 2022-04-27 MED ORDER — MISOPROSTOL 25 MCG QUARTER TABLET
25.0000 ug | ORAL_TABLET | Freq: Once | ORAL | Status: AC
  Administered 2022-04-27: 25 ug via VAGINAL

## 2022-04-27 NOTE — Discharge Summary (Addendum)
Obstetrical Discharge Summary  Date of Admission: 04/26/2022 Date of Discharge: 04/28/2022  Primary OB: [facility]   Gestational Age at Delivery: [redacted]w[redacted]d   Antepartum complications: none Reason for Admission: IOL, postdates  Date of Delivery: 04/27/2022  Delivered By: Siri Cole, Cnm  Delivery Type: spontaneous vaginal delivery Intrapartum complications/course: None Anesthesia: epidural Placenta: Delivered and expressed via active management. Intact: yes. To pathology: no.  Laceration: none Episiotomy: none EBL: 50ml Baby: Liveborn female, APGARs 8/9, weight 3750 g.    Discharge Diagnosis: Delivered.   Postpartum course: she is tolerating regular diet, her pain is controlled with PO medication, she is ambulating and voiding without difficulty.   Discharge Vital Signs:  Current Vital Signs 24h Vital Sign Ranges  T 98.5 F (36.9 C) Temp  Avg: 98.7 F (37.1 C)  Min: 98.1 F (36.7 C)  Max: 99.1 F (37.3 C)  BP 105/66 BP  Min: 91/70  Max: 105/66  HR 96 Pulse  Avg: 93.2  Min: 79  Max: 110  RR 20 Resp  Avg: 18.3  Min: 16  Max: 20  SaO2 100 % Room Air SpO2  Avg: 99.5 %  Min: 98 %  Max: 100 %       24 Hour I/O Current Shift I/O  Time Ins Outs 04/16 0701 - 04/17 0700 In: 0  Out: 800 [Urine:450] No intake/output data recorded.     Patient Vitals for the past 6 hrs:  BP Temp Temp src Resp  04/28/22 1615 105/66 98.5 F (36.9 C) Oral 20    Discharge Exam:  NAD Perineum: intact Abdomen: firm fundus below the umbilicus, NTTP, non distended, +bowel sounds.  Incision: NA RRR no MRGs CTAB Ext: no c/c/e  Recent Labs  Lab 04/26/22 2037 04/28/22 0545  WBC 11.3* 13.9*  HGB 10.2* 10.4*  HCT 31.3* 31.3*  PLT 189 180    Disposition: Home  Rh Immune globulin given: no/baby also Rh negative Rubella vaccine given: no Tdap vaccine given in AP or PP setting: yes Flu vaccine given in AP or PP setting: no  Contraception: Nexplanon on discharge   GYNECOLOGY PROCEDURE  NOTE  Patient is a 27 y.o. [id number] presenting for Nexplanon insertion as her desired means of contraception.  She provided informed consent, signed copy in the chart, time out was performed. Patient is 1 day postpartum.  She understands that Nexplanon is a progesterone only therapy, and that patients often have irregular and unpredictable vaginal bleeding or amenorrhea. She understands that other side effects are possible related to systemic progesterone, including but not limited to, headaches, breast tenderness, nausea, and irritability. While effective at preventing pregnancy long acting reversible contraceptives do not prevent transmission of sexually transmitted diseases and use of barrier methods for this purpose was discussed. The placement procedure for Nexplanon was reviewed with the patient in detail including risks of nerve injury, infection, bleeding and injury to other muscles or tendons. She understands that the Nexplanon implant is good for 3 years and needs to be removed at the end of that time.  She understands that Nexplanon is an extremely effective option for contraception, with failure rate of <1%. This information is reviewed today and all questions were answered. Informed consent was obtained, both verbally and written.   The patient is healthy and has no contraindications to Nexplanon use.  Procedure Appropriate time out taken.  Patient placed in dorsal supine with left arm above head, elbow flexed at 90 degrees, arm resting on examination table.  The bicipital  groove was palpated and site 8-10cm proximal to the medial epicondyle was indentified . The insertion site was prepped with a two betadine swabs and then injected with 2.5 ml of 1% lidocaine without epinephrine.  Nexplanon removed form sterile blister packaging,  Device confirmed in needle, before inserting full length of needle, tenting up the skin as the needle was advanced.  The drug eluding rod was then deployed by pulling  back the slider per the manufactures recommendation.  The implant was palpable by the clinician as well as the patient.  The insertion site covered dressed with a band aid before applying  a kerlex bandage pressure dressing..Minimal blood loss was noted during the procedure.  The patient tolerated the procedure well.   She was instructed to wear the bandage for 24 hours, call with any signs of infection.  She was given the Nexplanon card and instructed to have the rod removed in 3 years.  Charge [phone] for nexplanon device, CPT [id number] for procedure J2001 for lidocaine administration Modifer 25, plus Modifer 79 is done during a global billing visit     Prenatal/Postnatal Panel: B NEG//Rubella Immune//Varicella Immune//RPR negative//HIV negative/HepB Surface Ag negative//pap no abnormalities (date: 04/2019)//plans to breastfeed  Plan:  Rod Can was discharged to home in good condition. Follow-up appointment with LMD in 2 weeks for a virtual  visit then 6wk in person visit   Future Appointments  Date Time Provider Department Center  05/11/2022  1:35 PM Linzie Collin, MD [facility] None  05/11/2022  2:30 PM Jomarie Longs, MD [facility] None    Discharge Medications: Allergies as of 04/28/2022       Reactions   Hydrocodone Hives, Itching   Vicodin [hydrocodone-acetaminophen] Itching   Fruit Extracts Other (See Comments)   Pt states that she is allergic to all fruits including avocado's and fruits with seeds. The reaction changes the appearance of her tongue to a red, cracked appearance        Medication List     STOP taking these medications    omeprazole 20 MG capsule Commonly known as: PRILOSEC       TAKE these medications    Prenatal Vitamins 28-0.8 MG Tabs Take 1 tablet by mouth daily.   sertraline 50 MG tablet Commonly known as: ZOLOFT TAKE 1 TABLET (50 MG TOTAL) BY MOUTH DAILY. START BY TAKING 25 MG (HALF A TABLET) DAILY FOR 7 DAYS, THEN INCREASE TO  (1  TABLET) DAILY   SUMAtriptan 50 MG tablet Commonly known as: IMITREX Take 1 tablet (50 mg total) by mouth once as needed for up to 1 dose for migraine. May repeat in 2 hours if headache persists or recurs.        Parke Poisson, CNM [facility] 04/28/2022, 7:37 PM

## 2022-04-27 NOTE — Discharge Instructions (Signed)
Discharge instructions:   Call office if you have any of the following: headache, visual changes, fever >101 F, chills, breast concerns, excessive vaginal bleeding, leg pain or redness, depression or any other concerns.   Activity: Do not lift > 20 lbs for 6 weeks.  No intercourse or tampons for 6 weeks.  No driving for 1-2 weeks.   Call your doctor for increased pain or vaginal bleeding, temperature above 101.0, depression, or concerns.  No strenuous activity or heavy lifting for 6 weeks.  No intercourse, tampons, douching, or enemas for 6 weeks.  Tub baths and showers are ok.  No driving for 2 weeks or while taking pain medications.  Continue prenatal vitamin and iron.  Increase calories and fluids while breastfeeding.  For concerns about your baby please call the pediatrician For breastfeeding issues please remember to call our lactation consultants. 

## 2022-04-27 NOTE — Anesthesia Procedure Notes (Signed)
Epidural Patient location during procedure: OB Start time: 04/27/2022 3:58 PM End time: 04/27/2022 4:04 PM  Staffing Anesthesiologist: Reed Breech, MD Resident/CRNA: Elmarie Mainland, CRNA Performed: other anesthesia staff   Preanesthetic Checklist Completed: patient identified, IV checked, site marked, risks and benefits discussed, surgical consent, monitors and equipment checked and pre-op evaluation  Epidural Patient position: sitting Prep: ChloraPrep Patient monitoring: heart rate, continuous pulse ox and blood pressure Approach: midline Location: L3-L4 Injection technique: LOR saline  Needle:  Needle type: Tuohy  Needle gauge: 17 G Needle length: 9 cm and 9 Needle insertion depth: 6 cm Catheter type: closed end flexible Catheter size: 19 Gauge Catheter at skin depth: 11 cm Test dose: negative and 1.5% lidocaine with Epi 1:200 K  Assessment Events: blood not aspirated, no cerebrospinal fluid, injection not painful, no injection resistance, no paresthesia and negative IV test  Additional Notes 2 attempt Pt. Evaluated and documentation done after procedure finished. Patient identified. Risks/Benefits/Options discussed with patient including but not limited to bleeding, infection, nerve damage, paralysis, failed block, incomplete pain control, headache, blood pressure changes, nausea, vomiting, reactions to medication both or allergic, itching and postpartum back pain. Confirmed with bedside nurse the patient's most recent platelet count. Confirmed with patient that they are not currently taking any anticoagulation, have any bleeding history or any family history of bleeding disorders. Patient expressed understanding and wished to proceed. All questions were answered. Sterile technique was used throughout the entire procedure. Please see nursing notes for vital signs. Test dose was given through epidural catheter and negative prior to continuing to dose epidural or start  infusion. Warning signs of high block given to the patient including shortness of breath, tingling/numbness in hands, complete motor block, or any concerning symptoms with instructions to call for help. Patient was given instructions on fall risk and not to get out of bed. All questions and concerns addressed with instructions to call with any issues or inadequate analgesia.    Patient tolerated the insertion well without immediate complications.Reason for block:procedure for pain

## 2022-04-27 NOTE — Progress Notes (Signed)
   Subjective:  Is more uncomfortable, desires epidural   Objective:   Vitals: Blood pressure 117/63, pulse 99, temperature 98.9 F (37.2 C), resp. rate 16, last menstrual period 07/17/2021, unknown if currently breastfeeding. General: NAD Abdomen:non tender  Cervical Exam:  Dilation: 7 Effacement (%): 90 Cervical Position: Middle Station: -2 Presentation: Vertex Exam by:: L Amr Sturtevant, CNM  FHT: baseline 135, moderate variability, neg accel, neg decel  Toco:q 2-4 Pitocin at 6 milli-units   Results for orders placed or performed during the hospital encounter of 04/26/22 (from the past 24 hour(s))  CBC     Status: Abnormal   Collection Time: 04/26/22  8:37 PM  Result Value Ref Range   WBC 11.3 (H) 4.0 - 10.5 K/uL   RBC 3.65 (L) 3.87 - 5.11 MIL/uL   Hemoglobin 10.2 (L) 12.0 - 15.0 g/dL   HCT 16.1 (L) 09.6 - 04.5 %   MCV 85.8 80.0 - 100.0 fL   MCH 27.9 26.0 - 34.0 pg   MCHC 32.6 30.0 - 36.0 g/dL   RDW 40.9 (H) 81.1 - 91.4 %   Platelets 189 150 - 400 K/uL   nRBC 0.0 0.0 - 0.2 %  Type and screen     Status: None   Collection Time: 04/26/22  8:37 PM  Result Value Ref Range   ABO/RH(D) B NEG    Antibody Screen POS    Sample Expiration 04/29/2022,2359    Antibody Identification      PASSIVELY ACQUIRED ANTI-D Performed at [facility], [address] [place], [place] [address]   RPR     Status: None   Collection Time: 04/26/22  8:37 PM  Result Value Ref Range   RPR Ser Ql NON REACTIVE NON REACTIVE  Urine Drug Screen, Qualitative ([facility] only)     Status: Abnormal   Collection Time: 04/26/22  8:37 PM  Result Value Ref Range   Tricyclic, Ur Screen NONE DETECTED NONE DETECTED   Amphetamines, Ur Screen NONE DETECTED NONE DETECTED   MDMA (Ecstasy)Ur Screen NONE DETECTED NONE DETECTED   Cocaine Metabolite,Ur [place] NONE DETECTED NONE DETECTED   Opiate, Ur Screen NONE DETECTED NONE DETECTED   Phencyclidine (PCP) Ur S NONE DETECTED NONE DETECTED   Cannabinoid 50 Ng, Ur [place]  POSITIVE (A) NONE DETECTED   Barbiturates, Ur Screen NONE DETECTED NONE DETECTED   Benzodiazepine, Ur Scrn NONE DETECTED NONE DETECTED   Methadone Scn, Ur NONE DETECTED NONE DETECTED    Assessment:   27 y.o. [id number] [redacted]w[redacted]d admitted for IOL   Plan:   1) Labor -Active labor, Pitocin infusing, AROM when appropriate.    2) Fetus - category II tracing    3) GBS pos, has been receiving PCN,  membranes intact    4) Pain management: Anesthesia notified for epidural placement.   Dr Logan Bores updated on pt's progress.   Carie Caddy, CNM  [facility]  04/27/2022 3:43 PM

## 2022-04-27 NOTE — Progress Notes (Signed)
Jennifer [name] is a 27 y.o. [phone] at [redacted]w[redacted]d by ultrasound admitted for induction of labor due to Post dates. Due date 04/23/2022.POC was for IOL at 41 weeks; due to unit scheduling, she was set up for earlier  yesterday, and then [name]..she has received several doses of cytotec or cervical ripening.  Subjective:she has slept some during the night. Reports that she can feel her contractions more now, but declines any apin medication now   Objective: BP 118/79   Temp 98.8 F (37.1 C) (Oral)   LMP 07/17/2021 (Exact Date)  No intake/output data recorded. No intake/output data recorded.  FHT:  FHR: 120 bpm, variability: moderate,  accelerations:  Present,  decelerations:  Present rare, brief variable noted UC:   regular, every 1.5-5 minutes SVE:   Dilation: 3.5 Effacement (%): 50 Station: -3 Exam by:: Jennifer [name] CNM Bishop score is 5  Labs: Lab Results  Component Value Date   WBC 11.3 (H) 04/26/2022   HGB 10.2 (L) 04/26/2022   HCT 31.3 (L) 04/26/2022   MCV 85.8 04/26/2022   PLT 189 04/26/2022    Assessment / Plan: Onging induction of labor at term. Cervix still thick, but soft. Based on her bishop score will dose once more with vaginal cytotec .  Labor: Progressing normally  Fetal Wellbeing:  Category II Pain Control:  Labor support without medications I/D:  n/a Anticipated MOD:  NSVD Dr. Logan [name] updated on maternal/fetal status and POC.  Jennifer [name], CNM 04/27/2022, 7:42 AM

## 2022-04-27 NOTE — Anesthesia Preprocedure Evaluation (Signed)
Anesthesia Evaluation  Patient identified by MRN, date of birth, ID band Patient awake    Reviewed: H&P , NPO status , Patient's Chart, lab work & pertinent test results  Airway Mallampati: II       Dental no notable dental hx.    Pulmonary neg pulmonary ROS, former smoker   Pulmonary exam normal        Cardiovascular negative cardio ROS Normal cardiovascular exam     Neuro/Psych   Anxiety     negative neurological ROS     GI/Hepatic negative GI ROS, Neg liver ROS,,,  Endo/Other  negative endocrine ROS    Renal/GU negative Renal ROS  negative genitourinary   Musculoskeletal   Abdominal   Peds  Hematology  (+) Blood dyscrasia, anemia   Anesthesia Other Findings   Reproductive/Obstetrics (+) Pregnancy                             Anesthesia Physical Anesthesia Plan  ASA: 2  Anesthesia Plan: Epidural   Post-op Pain Management:    Induction:   PONV Risk Score and Plan:   Airway Management Planned:   Additional Equipment:   Intra-op Plan:   Post-operative Plan:   Informed Consent: I have reviewed the patients History and Physical, chart, labs and discussed the procedure including the risks, benefits and alternatives for the proposed anesthesia with the patient or authorized representative who has indicated his/her understanding and acceptance.       Plan Discussed with: Anesthesiologist and CRNA  Anesthesia Plan Comments:        Anesthesia Quick Evaluation

## 2022-04-27 NOTE — Progress Notes (Addendum)
   Subjective:  Starting to feel stronger contractions. Family at her bedside.   Objective:   Vitals: Blood pressure 102/68, pulse 93, temperature 98 F (36.7 C), temperature source Oral, resp. rate 18, last menstrual period 07/17/2021, unknown if currently breastfeeding. General: NAD Abdomen: Cervical Exam:  Dilation: 4 Effacement (%): 50 Cervical Position: Middle Station: -2 Presentation: Vertex Exam by:: L Shanitha Twining, CNM  FHT: baseline 140, moderate variability, pos accel, occasional variable Toco:q 1-4  Results for orders placed or performed during the hospital encounter of 04/26/22 (from the past 24 hour(s))  CBC     Status: Abnormal   Collection Time: 04/26/22  8:37 PM  Result Value Ref Range   WBC 11.3 (H) 4.0 - 10.5 K/uL   RBC 3.65 (L) 3.87 - 5.11 MIL/uL   Hemoglobin 10.2 (L) 12.0 - 15.0 g/dL   HCT 62.1 (L) 30.8 - 65.7 %   MCV 85.8 80.0 - 100.0 fL   MCH 27.9 26.0 - 34.0 pg   MCHC 32.6 30.0 - 36.0 g/dL   RDW 84.6 (H) 96.2 - 95.2 %   Platelets 189 150 - 400 K/uL   nRBC 0.0 0.0 - 0.2 %  Type and screen     Status: None   Collection Time: 04/26/22  8:37 PM  Result Value Ref Range   ABO/RH(D) B NEG    Antibody Screen POS    Sample Expiration 04/29/2022,2359    Antibody Identification      PASSIVELY ACQUIRED ANTI-D Performed at [facility], [address] [place], [place] [address]   RPR     Status: None   Collection Time: 04/26/22  8:37 PM  Result Value Ref Range   RPR Ser Ql NON REACTIVE NON REACTIVE  Urine Drug Screen, Qualitative ([facility] only)     Status: Abnormal   Collection Time: 04/26/22  8:37 PM  Result Value Ref Range   Tricyclic, Ur Screen NONE DETECTED NONE DETECTED   Amphetamines, Ur Screen NONE DETECTED NONE DETECTED   MDMA (Ecstasy)Ur Screen NONE DETECTED NONE DETECTED   Cocaine Metabolite,Ur [place] NONE DETECTED NONE DETECTED   Opiate, Ur Screen NONE DETECTED NONE DETECTED   Phencyclidine (PCP) Ur S NONE DETECTED NONE DETECTED    Cannabinoid 50 Ng, Ur [place] POSITIVE (A) NONE DETECTED   Barbiturates, Ur Screen NONE DETECTED NONE DETECTED   Benzodiazepine, Ur Scrn NONE DETECTED NONE DETECTED   Methadone Scn, Ur NONE DETECTED NONE DETECTED    Assessment:   27 y.o. [id number] [redacted]w[redacted]d admitted for IOL  Plan:   1) Labor -early labor, Pitocin ordered  2) Fetus - category II tracing   3) GBS pos, has been receiving PCN,  membranes intact   4) Pain management: aware of all options, will ask when desired   Carie Caddy, Ina Homes  [facility]  04/27/2022 11:23 AM

## 2022-04-28 ENCOUNTER — Encounter: Payer: Self-pay | Admitting: Obstetrics

## 2022-04-28 DIAGNOSIS — Z3046 Encounter for surveillance of implantable subdermal contraceptive: Secondary | ICD-10-CM | POA: Diagnosis not present

## 2022-04-28 LAB — CBC
HCT: 31.3 % — ABNORMAL LOW (ref 36.0–46.0)
Hemoglobin: 10.4 g/dL — ABNORMAL LOW (ref 12.0–15.0)
MCH: 28.3 pg (ref 26.0–34.0)
MCHC: 33.2 g/dL (ref 30.0–36.0)
MCV: 85.1 fL (ref 80.0–100.0)
Platelets: 180 10*3/uL (ref 150–400)
RBC: 3.68 MIL/uL — ABNORMAL LOW (ref 3.87–5.11)
RDW: 15.3 % (ref 11.5–15.5)
WBC: 13.9 10*3/uL — ABNORMAL HIGH (ref 4.0–10.5)
nRBC: 0 % (ref 0.0–0.2)

## 2022-04-28 MED ORDER — ETONOGESTREL 68 MG ~~LOC~~ IMPL
68.0000 mg | DRUG_IMPLANT | Freq: Once | SUBCUTANEOUS | Status: AC
  Administered 2022-04-28: 68 mg via SUBCUTANEOUS
  Filled 2022-04-28: qty 1

## 2022-04-28 MED ORDER — IBUPROFEN 600 MG PO TABS
600.0000 mg | ORAL_TABLET | Freq: Four times a day (QID) | ORAL | Status: DC
  Administered 2022-04-28 (×2): 600 mg via ORAL
  Filled 2022-04-28 (×2): qty 1

## 2022-04-28 MED ORDER — LIDOCAINE HCL (PF) 1 % IJ SOLN
INTRAMUSCULAR | Status: AC
  Filled 2022-04-28: qty 30

## 2022-04-28 MED ORDER — LIDOCAINE HCL 1 % IJ SOLN: 0.0000 mL | Freq: Once | INTRAMUSCULAR | Status: DC | PRN

## 2022-04-28 NOTE — Clinical Social Work Maternal (Signed)
CLINICAL SOCIAL WORK MATERNAL/CHILD NOTE  Patient Details  Name: Jennifer [name] MRN: [id number] Date of Birth: 09/04/1995  Date:  04/28/2022  Clinical Social Worker Initiating Note:  Darolyn Rua, [place] Date/Time: Initiated:  04/28/22/1135     Child's Name:  Jennifer [name]   Biological Parents:      Need for Interpreter:      Reason for Referral:   (positive for marijuana, hx of custody issues)   Address:  [address] [place] [address]    Phone number:  [phone] (home)     Additional phone number:   Household Members/Support Persons (HM/SP):   Household Member/Support Person 1   HM/SP Name Relationship DOB or Age  HM/SP -1 Jeri Lager FOB    HM/SP -2        HM/SP -3        HM/SP -4        HM/SP -5        HM/SP -6        HM/SP -7        HM/SP -8          Natural Supports (not living in the home):  Extended Family, Immediate Family   Professional Supports:     Employment: Unemployed   Type of Work:     Education:      Homebound arranged:    Architect:      Other Resources:  Food Stamps     Cultural/Religious Considerations Which May Impact Care:    Strengths:  Home prepared for child     Psychotropic Medications:         Pediatrician:       Pediatrician List:   [facility]      Pediatrician Fax Number: reports calling [facility] to schedule pending confirmation  Risk Factors/Current Problems:  Substance Use  , Family/Relationship Issues     Cognitive State:  Alert     Mood/Affect:  Calm     CSW Assessment:   CSW met with patient and FOB Jeri Lager at bedside, child's name is Jennifer [name]. Patient reports having 3 children at home that do not live full time with her. Tera Mater is 25 years old and lives with patient's mother who lives in [place]. Zaylan (2) and Skyler (4) have the same father. Zaylan sees patient  every 2 weeks alternating with dad, Martie Lee lives full time with dad.   Plan for Archie Patten will be to live full time with patient and FOB Harrold Donath.   She reports hx of anxiety and taking Zoloft, may re start taking after she has a follow up apt with PCP or OB this month. She reports having food stamps and wic, is unemployed and FOB works 2 jobs. FOB observed to be actively caring for child at bedside and changing diaper etc.   Reports have [place] and nursery set up at home for baby.   Reports recently moving to [address] [place].  Confirmed phone number in chart as accurate.   Reports working on scheduling pediatric apt, has car seat and transportation at dc.   MOB positive for marijuana, reports smoking 1 weeks ago. Reports no other drug or substance use, aware CPS report will be made.  CPS report made, no immediate discharge concerns at this time RN made aware.     CSW Plan/Description:  Child Protective Service Report      Darolyn Rua, [place] 04/28/2022, 11:37 AM

## 2022-04-28 NOTE — Progress Notes (Signed)
All discharge instructions reviewed with pt; pt discharged via wheelchair escorted by nurse tech; pt going home with significant other and new baby 

## 2022-04-28 NOTE — Progress Notes (Signed)
[facility] Subjective:  Doing well postpartum day 1; she is tolerating regular diet, her pain is controlled with PO medication, she is ambulating and voiding without difficulty.  Objective:  Vital signs in last 24 hours: Temp:  [97.9 F (36.6 C)-99.3 F (37.4 C)] 98.8 F (37.1 C) (04/17 0805) Pulse Rate:  [79-186] 81 (04/17 0805) Resp:  [16-20] 18 (04/17 0805) BP: (69-128)/(50-83) 91/70 (04/17 0805) SpO2:  [90 %-100 %] 100 % (04/17 0805)    General: NAD Pulmonary: no increased work of breathing Abdomen: non-distended, non-tender, fundus firm at level of umbilicus Extremities: no edema, no erythema, no tenderness  Results for orders placed or performed during the hospital encounter of 04/26/22 (from the past 72 hour(s))  CBC     Status: Abnormal   Collection Time: 04/26/22  8:37 PM  Result Value Ref Range   WBC 11.3 (H) 4.0 - 10.5 K/uL   RBC 3.65 (L) 3.87 - 5.11 MIL/uL   Hemoglobin 10.2 (L) 12.0 - 15.0 g/dL   HCT 16.1 (L) 09.6 - 04.5 %   MCV 85.8 80.0 - 100.0 fL   MCH 27.9 26.0 - 34.0 pg   MCHC 32.6 30.0 - 36.0 g/dL   RDW 40.9 (H) 81.1 - 91.4 %   Platelets 189 150 - 400 K/uL   nRBC 0.0 0.0 - 0.2 %    Comment: Performed at [facility], [address] [place], [place] [address]  Type and screen     Status: None   Collection Time: 04/26/22  8:37 PM  Result Value Ref Range   ABO/RH(D) B NEG    Antibody Screen POS    Sample Expiration 04/29/2022,2359    Antibody Identification      PASSIVELY ACQUIRED ANTI-D Performed at [facility], [address] [place], [place] [address]   RPR     Status: None   Collection Time: 04/26/22  8:37 PM  Result Value Ref Range   RPR Ser Ql NON REACTIVE NON REACTIVE    Comment: Performed at [facility], [address], [place], [place] [address]  Urine Drug Screen, Qualitative ([facility] only)     Status: Abnormal   Collection Time: 04/26/22  8:37 PM  Result Value Ref Range   Tricyclic, Ur Screen NONE DETECTED  NONE DETECTED   Amphetamines, Ur Screen NONE DETECTED NONE DETECTED   MDMA (Ecstasy)Ur Screen NONE DETECTED NONE DETECTED   Cocaine Metabolite,Ur [place] NONE DETECTED NONE DETECTED   Opiate, Ur Screen NONE DETECTED NONE DETECTED   Phencyclidine (PCP) Ur S NONE DETECTED NONE DETECTED   Cannabinoid 50 Ng, Ur [place] POSITIVE (A) NONE DETECTED   Barbiturates, Ur Screen NONE DETECTED NONE DETECTED   Benzodiazepine, Ur Scrn NONE DETECTED NONE DETECTED   Methadone Scn, Ur NONE DETECTED NONE DETECTED    Comment: (NOTE) Tricyclics + metabolites, urine    Cutoff 1000 ng/mL Amphetamines + metabolites, urine  Cutoff 1000 ng/mL MDMA (Ecstasy), urine              Cutoff 500 ng/mL Cocaine Metabolite, urine          Cutoff 300 ng/mL Opiate + metabolites, urine        Cutoff 300 ng/mL Phencyclidine (PCP), urine         Cutoff 25 ng/mL Cannabinoid, urine                 Cutoff 50 ng/mL Barbiturates + metabolites, urine  Cutoff 200 ng/mL Benzodiazepine, urine  Cutoff 200 ng/mL Methadone, urine                   Cutoff 300 ng/mL  The urine drug screen provides only a preliminary, unconfirmed analytical test result and should not be used for non-medical purposes. Clinical consideration and professional judgment should be applied to any positive drug screen result due to possible interfering substances. A more specific alternate chemical method must be used in order to obtain a confirmed analytical result. Gas chromatography / mass spectrometry (GC/MS) is the preferred confirm atory method. Performed at [facility], [address] [place], [place] [address]   CBC     Status: Abnormal   Collection Time: 04/28/22  5:45 AM  Result Value Ref Range   WBC 13.9 (H) 4.0 - 10.5 K/uL   RBC 3.68 (L) 3.87 - 5.11 MIL/uL   Hemoglobin 10.4 (L) 12.0 - 15.0 g/dL   HCT 60.4 (L) 54.0 - 98.1 %   MCV 85.1 80.0 - 100.0 fL   MCH 28.3 26.0 - 34.0 pg   MCHC 33.2 30.0 - 36.0 g/dL   RDW 19.1 47.8 - 29.5 %    Platelets 180 150 - 400 K/uL   nRBC 0.0 0.0 - 0.2 %    Comment: Performed at [facility], [address] [place], [place] [address]    Assessment:   27 y.o. [id number] postpartum day # 1  Plan:    1) Acute blood loss anemia - hemodynamically stable and asymptomatic - po ferrous sulfate  2) Blood Type --/--/B NEG (04/15 2037) / Rubella 1.86 (09/15 1008) / Varicella Immune  3) TDAP status up to date  4) Feeding plan: formula  5)  Education given regarding options for contraception, as well as compatibility with breast feeding if applicable.  Patient plans on Nexplanon for contraception.  6) Disposition: continue current care with likely discharge to home later today   Tresea Mall, CNM [facility] 04/28/2022, 9:18 AM

## 2022-04-28 NOTE — Anesthesia Postprocedure Evaluation (Signed)
Anesthesia Post Note  Patient: Rod Can  Procedure(s) Performed: AN AD HOC LABOR EPIDURAL  Patient location during evaluation: Mother Baby Anesthesia Type: Epidural Level of consciousness: oriented and awake and alert Pain management: pain level controlled Vital Signs Assessment: post-procedure vital signs reviewed and stable Respiratory status: spontaneous breathing and respiratory function stable Cardiovascular status: blood pressure returned to baseline and stable Postop Assessment: no headache, no backache, no apparent nausea or vomiting and able to ambulate Anesthetic complications: no  No notable events documented.   Last Vitals:  Vitals:   04/27/22 2256 04/28/22 0338  BP: (!) 91/55 (!) 99/58  Pulse: 85 79  Resp: 18 18  Temp: 37.3 C 37 C  SpO2: 99% 100%    Last Pain:  Vitals:   04/28/22 0340  TempSrc:   PainSc: Asleep                 Starling Manns

## 2022-05-11 ENCOUNTER — Ambulatory Visit: Payer: Medicaid Other | Admitting: Obstetrics and Gynecology

## 2022-05-11 ENCOUNTER — Ambulatory Visit: Payer: Medicaid Other | Admitting: Psychiatry

## 2022-05-20 ENCOUNTER — Encounter: Payer: Self-pay | Admitting: Obstetrics and Gynecology

## 2022-05-20 ENCOUNTER — Ambulatory Visit (INDEPENDENT_AMBULATORY_CARE_PROVIDER_SITE_OTHER): Payer: Medicaid Other | Admitting: Obstetrics and Gynecology

## 2022-05-20 DIAGNOSIS — Z3046 Encounter for surveillance of implantable subdermal contraceptive: Secondary | ICD-10-CM

## 2022-05-20 DIAGNOSIS — Z30011 Encounter for initial prescription of contraceptive pills: Secondary | ICD-10-CM

## 2022-05-20 MED ORDER — NORETHIN ACE-ETH ESTRAD-FE 1-20 MG-MCG(24) PO TABS
1.0000 | ORAL_TABLET | Freq: Every day | ORAL | 3 refills | Status: DC
Start: 2022-05-20 — End: 2022-08-19

## 2022-05-20 NOTE — Patient Instructions (Signed)

## 2022-05-20 NOTE — Progress Notes (Signed)
   OBSTETRICS POSTPARTUM CLINIC PROGRESS NOTE  Subjective:     Jennifer [name] is a 27 y.o. [phone] female who presents for a postpartum visit. She is 6 week postpartum following a spontaneous vaginal delivery. I have fully reviewed the prenatal and intrapartum course. The delivery was at 40 weeks 4 days gestational weeks.  Anesthesia: epidural. Postpartum course has been well. Baby's course has been well. Baby is feeding by breast. Bleeding: patient has not not resumed menses, with No LMP recorded. Bowel function is normal. Bladder function is normal. Patient is sexually active. Contraception method desired is Nexplanon. Postpartum depression screening: negative.  EDPS score is 1. Desires Nexplanon removal today due to persistent headaches has used Fioricet and Excedrin without relief). Has a h/o headaches but none that would resolve.  Nexplanon was placed immediately postpartum in the hospital prior to discharge.   The following portions of the patient's history were reviewed and updated as appropriate: allergies, current medications, past family history, past medical history, past social history, past surgical history, and problem list.  Review of Systems Pertinent items are noted in HPI.   Objective:    BP 104/79   Pulse 88   Ht 5\' 2"  (1.575 m)   Breastfeeding No   BMI 29.76 kg/m   General:  alert and no distress   Breasts:  inspection negative, no nipple discharge or bleeding, no masses or nodularity palpable  Lungs: clear to auscultation bilaterally  Heart:  regular rate and rhythm, S1, S2 normal, no murmur, click, rub or gallop  Abdomen: soft, non-tender; bowel sounds normal; no masses,  no organomegaly.     Vulva:  normal  Vagina: normal vagina, no discharge, exudate, lesion, or erythema  Cervix:  no cervical motion tenderness and no lesions  Corpus: normal size, contour, position, consistency, mobility, non-tender  Adnexa:  normal adnexa and no mass, fullness, tenderness  Rectal  Exam: Not performed.         Labs:  Lab Results  Component Value Date   HGB 10.4 (L) 04/28/2022    Assessment:   1. Postpartum care following vaginal delivery   2. Nexplanon removal   3. Encounter for initial prescription of contraceptive pills      Plan:    1. Contraception: Nexplanon wants removed today. Desires to utilize OCPs as she has used these in the past. Counseled on using back up method for 2 weeks since she is changing methods.  2. Follow up in:  4-6  months for annual exam, or sooner as needed.    Nexplanon Removal Patient identified, informed consent performed, consent signed.   Appropriate time out taken. Nexplanon site identified.  Area prepped in usual sterile fashon. One ml of 1% lidocaine was used to anesthetize the area at the distal end of the implant. A small stab incision was made right beside the implant on the distal portion.  The Nexplanon rod was grasped using hemostats and removed without difficulty.  There was minimal blood loss. There were no complications.  3 ml of 1% lidocaine was injected around the incision for post-procedure analgesia.  Steri-strips were applied over the small incision.  A pressure bandage was applied to reduce any bruising.  The patient tolerated the procedure well and was given post procedure instructions.  Patient is planning to use OCPs for contraception/attempt conception.    Hildred Laser, MD [facility]

## 2022-05-24 ENCOUNTER — Telehealth: Payer: Self-pay

## 2022-05-24 NOTE — Telephone Encounter (Signed)
[facility]- Discharge Call Backs-Spoke to patient on the phone about the following below. 1-Do you have any questions or concerns about yourself as you heal?  No 2-Any concerns or questions about your baby? No 3- Reviewed ABC's of safe sleep. 4-How was your stay at the hospital?Good 5- Did our team work together to care for you?Yes You should be receiving a survey in the mail soon.   We would really appreciate it if you could fill that out for us and return it in the mail.  We value the feedback to make improvements and continue the great work we do.   If you have any questions please feel free to call me back at [phone]  

## 2022-08-11 ENCOUNTER — Ambulatory Visit (INDEPENDENT_AMBULATORY_CARE_PROVIDER_SITE_OTHER): Payer: Medicaid Other

## 2022-08-11 ENCOUNTER — Other Ambulatory Visit (HOSPITAL_COMMUNITY)
Admission: RE | Admit: 2022-08-11 | Discharge: 2022-08-11 | Disposition: A | Discharge status: Home / Self Care | Payer: Medicaid Other | Source: Ambulatory Visit | Attending: Obstetrics and Gynecology | Admitting: Obstetrics and Gynecology

## 2022-08-11 VITALS — BP 110/73 | HR 94 | Ht 62.0 in | Wt 157.0 lb

## 2022-08-11 DIAGNOSIS — N898 Other specified noninflammatory disorders of vagina: Secondary | ICD-10-CM | POA: Diagnosis present

## 2022-08-11 LAB — POCT URINALYSIS DIPSTICK
Bilirubin, UA: NEGATIVE
Blood, UA: NEGATIVE
Glucose, UA: NEGATIVE
Ketones, UA: NEGATIVE
Leukocytes, UA: NEGATIVE
Nitrite, UA: NEGATIVE
Protein, UA: NEGATIVE
Spec Grav, UA: 1.025 (ref 1.010–1.025)
Urobilinogen, UA: 0.2 E.U./dL
pH, UA: 5 (ref 5.0–8.0)

## 2022-08-11 NOTE — Progress Notes (Signed)
    NURSE VISIT NOTE  Subjective:    Patient ID: Rod Can, female    DOB: July 07, 1995, 27 y.o.   MRN: [id number]  HPI  Patient is a 27 y.o. [phone] female who presents for vaginal itching and sometimes pain with sex for few day(s). Denies abnormal vaginal bleeding or significant pelvic pain or fever. Has had some abdominal pain. Patient denies history of known exposure to STD.   Objective:    BP 110/73   Pulse 94   Ht 5\' 2"  (1.575 m)   Wt 157 lb (71.2 kg)   BMI 28.72 kg/m      Assessment:   1. Vaginal itching       Plan:   GC and chlamydia DNA  probe sent to lab. Treatment: abstain from coitus during course of treatment ROV prn if symptoms persist or worsen. Urine culture sent to lab   Loney Laurence, CMA

## 2022-08-13 ENCOUNTER — Other Ambulatory Visit: Payer: Self-pay

## 2022-08-13 DIAGNOSIS — B9689 Other specified bacterial agents as the cause of diseases classified elsewhere: Secondary | ICD-10-CM

## 2022-08-13 DIAGNOSIS — B379 Candidiasis, unspecified: Secondary | ICD-10-CM

## 2022-08-13 MED ORDER — FLUCONAZOLE 150 MG PO TABS
150.0000 mg | ORAL_TABLET | Freq: Once | ORAL | 0 refills | Status: AC
Start: 2022-08-13 — End: 2022-08-13

## 2022-08-13 MED ORDER — METRONIDAZOLE 500 MG PO TABS
500.0000 mg | ORAL_TABLET | Freq: Two times a day (BID) | ORAL | 0 refills | Status: AC
Start: 2022-08-13 — End: ?

## 2022-08-13 NOTE — Telephone Encounter (Signed)
Pt calling for results and to get antibx before the weekend.

## 2022-08-19 ENCOUNTER — Telehealth: Payer: Self-pay

## 2022-08-19 ENCOUNTER — Ambulatory Visit (INDEPENDENT_AMBULATORY_CARE_PROVIDER_SITE_OTHER): Payer: Medicaid Other

## 2022-08-19 VITALS — BP 102/61 | HR 94 | Ht 62.0 in | Wt 167.0 lb

## 2022-08-19 DIAGNOSIS — Z3201 Encounter for pregnancy test, result positive: Secondary | ICD-10-CM

## 2022-08-19 DIAGNOSIS — N912 Amenorrhea, unspecified: Secondary | ICD-10-CM

## 2022-08-19 LAB — POCT URINE PREGNANCY: Preg Test, Ur: POSITIVE — AB

## 2022-08-19 NOTE — Telephone Encounter (Signed)
Pt came in for pregnancy confirmation and she wanted to know if Flagyl was safe to take. Checked with our midwife Autumn Messing and yes she can take it. Called pt and she is aware.

## 2022-08-19 NOTE — Patient Instructions (Signed)
First Trimester of Pregnancy  The first trimester of pregnancy starts on the first day of your last menstrual period until the end of week 12. This is months 1 through 3 of pregnancy. A week after a sperm fertilizes an egg, the egg will implant into the wall of the uterus and begin to develop into a baby. By the end of 12 weeks, all the baby's organs will be formed and the baby will be 2-3 inches in size. Body changes during your first trimester Your body goes through many changes during pregnancy. The changes vary and generally return to normal after your baby is born. Physical changes You may gain or lose weight. Your breasts may begin to grow larger and become tender. The tissue that surrounds your nipples (areola) may become darker. Dark spots or blotches (chloasma or mask of pregnancy) may develop on your face. You may have changes in your hair. These can include thickening or thinning of your hair or changes in texture. Health changes You may feel nauseous, and you may vomit. You may have heartburn. You may develop headaches. You may develop constipation. Your gums may bleed and may be sensitive to brushing and flossing. Other changes You may tire easily. You may urinate more often. Your menstrual periods will stop. You may have a loss of appetite. You may develop cravings for certain kinds of food. You may have changes in your emotions from day to day. You may have more vivid and strange dreams. Follow these instructions at home: Medicines Follow your health care provider's instructions regarding medicine use. Specific medicines may be either safe or unsafe to take during pregnancy. Do not take any medicines unless told to by your health care provider. Take a prenatal vitamin that contains at least 600 micrograms (mcg) of folic acid. Eating and drinking Eat a healthy diet that includes fresh fruits and vegetables, whole grains, good sources of protein such as meat, eggs, or tofu,  and low-fat dairy products. Avoid raw meat and unpasteurized juice, milk, and cheese. These carry germs that can harm you and your baby. If you feel nauseous or you vomit: Eat 4 or 5 small meals a day instead of 3 large meals. Try eating a few soda crackers. Drink liquids between meals instead of during meals. You may need to take these actions to prevent or treat constipation: Drink enough fluid to keep your urine pale yellow. Eat foods that are high in fiber, such as beans, whole grains, and fresh fruits and vegetables. Limit foods that are high in fat and processed sugars, such as fried or sweet foods. Activity Exercise only as directed by your health care provider. Most people can continue their usual exercise routine during pregnancy. Try to exercise for 30 minutes at least 5 days a week. Stop exercising if you develop pain or cramping in the lower abdomen or lower back. Avoid exercising if it is very hot or humid or if you are at high altitude. Avoid heavy lifting. If you choose to, you may have sex unless your health care provider tells you not to. Relieving pain and discomfort Wear a good support bra to relieve breast tenderness. Rest with your legs elevated if you have leg cramps or low back pain. If you develop bulging veins (varicose veins) in your legs: Wear support hose as told by your health care provider. Elevate your feet for 15 minutes, 3-4 times a day. Limit salt in your diet. Safety Wear your seat belt at all times when  driving or riding in a car. Talk with your health care provider if someone is verbally or physically abusive to you. Talk with your health care provider if you are feeling sad or have thoughts of hurting yourself. Lifestyle Do not use hot tubs, steam rooms, or saunas. Do not douche. Do not use tampons or scented sanitary pads. Do not use herbal remedies, alcohol, illegal drugs, or medicines that are not approved by your health care provider. Chemicals  in these products can harm your baby. Do not use any products that contain nicotine or tobacco, such as cigarettes, e-cigarettes, and chewing tobacco. If you need help quitting, ask your health care provider. Avoid cat litter boxes and soil used by cats. These carry germs that can cause birth defects in the baby and possibly loss of the unborn baby (fetus) by miscarriage or stillbirth. General instructions During routine prenatal visits in the first trimester, your health care provider will do a physical exam, perform necessary tests, and ask you how things are going. Keep all follow-up visits. This is important. Ask for help if you have counseling or nutritional needs during pregnancy. Your health care provider can offer advice or refer you to specialists for help with various needs. Schedule a dentist appointment. At home, brush your teeth with a soft toothbrush. Floss gently. Write down your questions. Take them to your prenatal visits. Where to find more information American Pregnancy Association: americanpregnancy.org [employer]: https://www.todd-brady.net/ Office on [employer] Health: MightyReward[employer].nz Contact a health care provider if you have: Dizziness. A fever. Mild pelvic cramps, pelvic pressure, or nagging pain in the abdominal area. Nausea, vomiting, or diarrhea that lasts for 24 hours or longer. A bad-smelling vaginal discharge. Pain when you urinate. Known exposure to a contagious illness, such as chickenpox, measles, Zika virus, HIV, or hepatitis. Get help right away if you have: Spotting or bleeding from your vagina. Severe abdominal cramping or pain. Shortness of breath or chest pain. Any kind of trauma, such as from a fall or a car crash. New or increased pain, swelling, or redness in an arm or leg. Summary The first trimester of pregnancy starts on the first day of your last menstrual period until the end of week  12 (months 1 through 3). Eating 4 or 5 small meals a day rather than 3 large meals may help to relieve nausea and vomiting. Do not use any products that contain nicotine or tobacco, such as cigarettes, e-cigarettes, and chewing tobacco. If you need help quitting, ask your health care provider. Keep all follow-up visits. This is important. This information is not intended to replace advice given to you by your health care provider. Make sure you discuss any questions you have with your health care provider. Document Revised: 06/06/2019 Document Reviewed: 04/12/2019 Elsevier Patient Education  2024 Elsevier Inc.   Commonly Asked Questions During Pregnancy  Cats: A parasite can be excreted in cat feces.  To avoid exposure you need to have another person empty the little box.  If you must empty the litter box you will need to wear gloves.  Wash your hands after handling your cat.  This parasite can also be found in raw or undercooked meat so this should also be avoided.  Colds, Sore Throats, Flu: Please check your medication sheet to see what you can take for symptoms.  If your symptoms are unrelieved by these medications please call the office.  Dental Work: Most any dental work Agricultural consultant recommends is permitted.  X-rays should only be taken during the first trimester if absolutely necessary.  Your abdomen should be shielded with a lead apron during all x-rays.  Please notify your provider prior to receiving any x-rays.  Novocaine is fine; gas is not recommended.  If your dentist requires a note from [place] prior to dental work please call the office and we will provide one for you.  Exercise: Exercise is an important part of staying healthy during your pregnancy.  You may continue most exercises you were accustomed to prior to pregnancy.  Later in your pregnancy you will most likely notice you have difficulty with activities requiring balance like riding a bicycle.  It is important that you listen to  your body and avoid activities that put you at a higher risk of falling.  Adequate rest and staying well hydrated are a must!  If you have questions about the safety of specific activities ask your provider.    Exposure to Children with illness: Try to avoid obvious exposure; report any symptoms to [place] when noted,  If you have chicken pos, red measles or mumps, you should be immune to these diseases.   Please do not take any vaccines while pregnant unless you have checked with your OB provider.  Fetal Movement: After 28 weeks we recommend you do "kick counts" twice daily.  Lie or sit down in a calm quiet environment and count your baby movements "kicks".  You should feel your baby at least 10 times per hour.  If you have not felt 10 kicks within the first hour get up, walk around and have something sweet to eat or drink then repeat for an additional hour.  If count remains less than 10 per hour notify your provider.  Fumigating: Follow your pest control agent's advice as to how long to stay out of your home.  Ventilate the area well before re-entering.  Hemorrhoids:   Most over-the-counter preparations can be used during pregnancy.  Check your medication to see what is safe to use.  It is important to use a stool softener or fiber in your diet and to drink lots of liquids.  If hemorrhoids seem to be getting worse please call the office.   Hot Tubs:  Hot tubs Jacuzzis and saunas are not recommended while pregnant.  These increase your internal body temperature and should be avoided.  Intercourse:  Sexual intercourse is safe during pregnancy as long as you are comfortable, unless otherwise advised by your provider.  Spotting may occur after intercourse; report any bright red bleeding that is heavier than spotting.  Labor:  If you know that you are in labor, please go to the hospital.  If you are unsure, please call the office and let us help you decide what to do.  Lifting, straining, etc:  If your job  requires heavy lifting or straining please check with your provider for any limitations.  Generally, you should not lift items heavier than that you can lift simply with your hands and arms (no back muscles)  Painting:  Paint fumes do not harm your pregnancy, but may make you ill and should be avoided if possible.  Latex or water based paints have less odor than oils.  Use adequate ventilation while painting.  Permanents & Hair Color:  Chemicals in hair dyes are not recommended as they cause increase hair dryness which can increase hair loss during pregnancy.  " Highlighting" and permanents are allowed.  Dye may be absorbed differently and permanents  may not hold as well during pregnancy.  Sunbathing:  Use a sunscreen, as skin burns easily during pregnancy.  Drink plenty of fluids; avoid over heating.  Tanning Beds:  Because their possible side effects are still unknown, tanning beds are not recommended.  Ultrasound Scans:  Routine ultrasounds are performed at approximately 20 weeks.  You will be able to see your baby's general anatomy an if you would like to know the gender this can usually be determined as well.  If it is questionable when you conceived you may also receive an ultrasound early in your pregnancy for dating purposes.  Otherwise ultrasound exams are not routinely performed unless there is a medical necessity.  Although you can request a scan we ask that you pay for it when conducted because insurance does not cover " patient request" scans.  Work: If your pregnancy proceeds without complications you may work until your due date, unless your physician or employer advises otherwise.  Round Ligament Pain/Pelvic Discomfort:  Sharp, shooting pains not associated with bleeding are fairly common, usually occurring in the second trimester of pregnancy.  They tend to be worse when standing up or when you remain standing for long periods of time.  These are the result of pressure of certain  pelvic ligaments called "round ligaments".  Rest, Tylenol and heat seem to be the most effective relief.  As the womb and fetus grow, they rise out of the pelvis and the discomfort improves.  Please notify the office if your pain seems different than that described.  It may represent a more serious condition.  Commonly Asked Questions During Pregnancy  Cats: A parasite can be excreted in cat feces.  To avoid exposure you need to have another person empty the little box.  If you must empty the litter box you will need to wear gloves.  Wash your hands after handling your cat.  This parasite can also be found in raw or undercooked meat so this should also be avoided.  Colds, Sore Throats, Flu: Please check your medication sheet to see what you can take for symptoms.  If your symptoms are unrelieved by these medications please call the office.  Dental Work: Most any dental work Agricultural consultant recommends is permitted.  X-rays should only be taken during the first trimester if absolutely necessary.  Your abdomen should be shielded with a lead apron during all x-rays.  Please notify your provider prior to receiving any x-rays.  Novocaine is fine; gas is not recommended.  If your dentist requires a note from [place] prior to dental work please call the office and we will provide one for you.  Exercise: Exercise is an important part of staying healthy during your pregnancy.  You may continue most exercises you were accustomed to prior to pregnancy.  Later in your pregnancy you will most likely notice you have difficulty with activities requiring balance like riding a bicycle.  It is important that you listen to your body and avoid activities that put you at a higher risk of falling.  Adequate rest and staying well hydrated are a must!  If you have questions about the safety of specific activities ask your provider.    Exposure to Children with illness: Try to avoid obvious exposure; report any symptoms to [place] when noted,   If you have chicken pos, red measles or mumps, you should be immune to these diseases.   Please do not take any vaccines while pregnant unless you have checked with  your OB provider.  Fetal Movement: After 28 weeks we recommend you do "kick counts" twice daily.  Lie or sit down in a calm quiet environment and count your baby movements "kicks".  You should feel your baby at least 10 times per hour.  If you have not felt 10 kicks within the first hour get up, walk around and have something sweet to eat or drink then repeat for an additional hour.  If count remains less than 10 per hour notify your provider.  Fumigating: Follow your pest control agent's advice as to how long to stay out of your home.  Ventilate the area well before re-entering.  Hemorrhoids:   Most over-the-counter preparations can be used during pregnancy.  Check your medication to see what is safe to use.  It is important to use a stool softener or fiber in your diet and to drink lots of liquids.  If hemorrhoids seem to be getting worse please call the office.   Hot Tubs:  Hot tubs Jacuzzis and saunas are not recommended while pregnant.  These increase your internal body temperature and should be avoided.  Intercourse:  Sexual intercourse is safe during pregnancy as long as you are comfortable, unless otherwise advised by your provider.  Spotting may occur after intercourse; report any bright red bleeding that is heavier than spotting.  Labor:  If you know that you are in labor, please go to the hospital.  If you are unsure, please call the office and let us help you decide what to do.  Lifting, straining, etc:  If your job requires heavy lifting or straining please check with your provider for any limitations.  Generally, you should not lift items heavier than that you can lift simply with your hands and arms (no back muscles)  Painting:  Paint fumes do not harm your pregnancy, but may make you ill and should be avoided if possible.   Latex or water based paints have less odor than oils.  Use adequate ventilation while painting.  Permanents & Hair Color:  Chemicals in hair dyes are not recommended as they cause increase hair dryness which can increase hair loss during pregnancy.  " Highlighting" and permanents are allowed.  Dye may be absorbed differently and permanents may not hold as well during pregnancy.  Sunbathing:  Use a sunscreen, as skin burns easily during pregnancy.  Drink plenty of fluids; avoid over heating.  Tanning Beds:  Because their possible side effects are still unknown, tanning beds are not recommended.  Ultrasound Scans:  Routine ultrasounds are performed at approximately 20 weeks.  You will be able to see your baby's general anatomy an if you would like to know the gender this can usually be determined as well.  If it is questionable when you conceived you may also receive an ultrasound early in your pregnancy for dating purposes.  Otherwise ultrasound exams are not routinely performed unless there is a medical necessity.  Although you can request a scan we ask that you pay for it when conducted because insurance does not cover " patient request" scans.  Work: If your pregnancy proceeds without complications you may work until your due date, unless your physician or employer advises otherwise.  Round Ligament Pain/Pelvic Discomfort:  Sharp, shooting pains not associated with bleeding are fairly common, usually occurring in the second trimester of pregnancy.  They tend to be worse when standing up or when you remain standing for long periods of time.  These are the result  of pressure of certain pelvic ligaments called "round ligaments".  Rest, Tylenol and heat seem to be the most effective relief.  As the womb and fetus grow, they rise out of the pelvis and the discomfort improves.  Please notify the office if your pain seems different than that described.  It may represent a more serious condition.

## 2022-08-19 NOTE — Progress Notes (Signed)
    NURSE VISIT NOTE  Subjective:    Patient ID: Jennifer [name], female    DOB: [birth year], 27 y.o.   MRN: [id number]  HPI  Patient is a 27 y.o. [id number] female who presents for evaluation of amenorrhea. She believes she could be pregnant. Pregnancy is desired. Sexual Activity: has sex with males. Current symptoms also include: nausea and positive home pregnancy test. Last period was normal.    Objective:    BP 102/61   Pulse 94   Ht 5\' 2"  (1.575 m)   Wt 167 lb (75.8 kg)   LMP 07/17/2021 (Exact Date)   Breastfeeding No   BMI 30.54 kg/m   Lab Review  Results for orders placed or performed in visit on 08/19/22  POCT urine pregnancy  Result Value Ref Range   Preg Test, Ur Positive (A) Negative    Assessment:   1. Amenorrhea     Plan:   Pregnancy Test: Positive  Estimated Date of Delivery: None noted. Dating scan ordered. Encouraged well-balanced diet, plenty of rest when needed, pre-natal vitamins daily and walking for exercise.  Discussed self-help for nausea, avoiding OTC medications until consulting provider or pharmacist, other than Tylenol as needed, minimal caffeine (1-2 cups daily) and avoiding alcohol.   She will schedule her nurse visit @ 7-[redacted] wks pregnant, u/s for dating @10  wk, and NOB visit at [redacted] wk pregnant.    Feel free to call with any questions.    Donnetta Hail, CMA

## 2022-08-25 ENCOUNTER — Other Ambulatory Visit: Payer: Medicaid Other

## 2022-08-25 ENCOUNTER — Other Ambulatory Visit: Payer: Self-pay | Admitting: Obstetrics and Gynecology

## 2022-08-25 ENCOUNTER — Ambulatory Visit (INDEPENDENT_AMBULATORY_CARE_PROVIDER_SITE_OTHER): Payer: Medicaid Other

## 2022-08-25 DIAGNOSIS — Z3687 Encounter for antenatal screening for uncertain dates: Secondary | ICD-10-CM

## 2022-08-25 DIAGNOSIS — Z3A01 Less than 8 weeks gestation of pregnancy: Secondary | ICD-10-CM | POA: Diagnosis not present

## 2022-08-25 DIAGNOSIS — N912 Amenorrhea, unspecified: Secondary | ICD-10-CM

## 2022-08-27 ENCOUNTER — Ambulatory Visit: Payer: Medicaid Other

## 2022-09-15 ENCOUNTER — Other Ambulatory Visit: Payer: Medicaid Other

## 2022-09-17 ENCOUNTER — Ambulatory Visit (INDEPENDENT_AMBULATORY_CARE_PROVIDER_SITE_OTHER): Payer: Medicaid Other | Admitting: Licensed Practical Nurse

## 2022-09-17 ENCOUNTER — Encounter: Payer: Self-pay | Admitting: Licensed Practical Nurse

## 2022-09-17 VITALS — BP 99/76 | HR 104 | Wt 165.0 lb

## 2022-09-17 DIAGNOSIS — F419 Anxiety disorder, unspecified: Secondary | ICD-10-CM | POA: Diagnosis not present

## 2022-09-17 DIAGNOSIS — R5383 Other fatigue: Secondary | ICD-10-CM | POA: Diagnosis not present

## 2022-09-17 DIAGNOSIS — Z30016 Encounter for initial prescription of transdermal patch hormonal contraceptive device: Secondary | ICD-10-CM

## 2022-09-17 DIAGNOSIS — R232 Flushing: Secondary | ICD-10-CM | POA: Diagnosis not present

## 2022-09-17 MED ORDER — NORELGESTROMIN-ETH ESTRADIOL 150-35 MCG/24HR TD PTWK
1.0000 | MEDICATED_PATCH | TRANSDERMAL | 12 refills | Status: DC
Start: 2022-09-17 — End: 2022-09-23

## 2022-09-17 NOTE — Progress Notes (Signed)
Acute Office Visit  Subjective:     Patient ID: Jennifer [name], female    DOB: 02/09/1995, 27 y.o.   MRN: [id number]     HPI Patient is in today for hotlflashes, "not feeling right". Jennifer [name] had a SVB in  April, she recently had a TAB.  For the last 3 days she has been having hot flashes and feeling sweaty. She feels "drained" she would like her iron checked because she knows that she has been anemic in the past. She is not experiencing any bleeding, only some pink discharge when wiping.  Jennifer [name] admits that over the last 2 weeks she has been experiencing more anxiety. She feels "overwhelmed". She cries easily. She has recently started to smoke MJ. She tried Zoloft in pregnancy, felt it did not help so stopped. Her mother has given her Clonopin which helps.  She is a busy mother of multiple children, her oldest lives with her mother and is with Jennifer [name] on the weekends, her partner has children from another relationship-2 of those children are around periodically. Her partner works 12 hour shifts every day. She does have some hep from friends. Jennifer [name] gets about 5 hours of sleep per night. She is not exercising. She would be interested in therapy but feels that she does not have the time or childcare for appointments.   She does not desire another pregnancy for sometime she is not ready to tie her tubes. She was prescribed OCP's, but she forgets to take them. She is interested in the patch. She tried the IUD once and it caused a lot of pain.  -has hx of Headaches perhaps Migraines without aura, denies hx of CHTN or tobacco use   ROS see HPI       Objective:    BP 99/76   Pulse (!) 104   Wt 165 lb (74.8 kg)   LMP 08/30/2022   BMI 30.18 kg/m     Physical Exam Constitutional:      Appearance: Normal appearance.  Neck:     Comments: Thyroid not enlarged, no nodules  Cardiovascular:     Rate and Rhythm: Normal rate and regular rhythm.     Heart sounds: Normal heart sounds.  Pulmonary:      Effort: Pulmonary effort is normal.     Breath sounds: Normal breath sounds.  Musculoskeletal:     Cervical back: Normal range of motion.  Neurological:     General: No focal deficit present.     Mental Status: She is alert.  Psychiatric:        Mood and Affect: Mood normal.        Thought Content: Thought content normal.   PHQ-9 10 GAD-7 5  No results found for any visits on 09/17/22.      Assessment & Plan:   Problem List Items Addressed This Visit   None Visit Diagnoses     Anxiety    -  Primary   Relevant Orders   Ambulatory referral to Psychology   Other fatigue       Relevant Orders   CBC   TSH + free T4   Hot flashes       Relevant Orders   TSH + free T4   Encounter for initial prescription of transdermal patch hormonal contraceptive device       Relevant Medications   norelgestromin-ethinyl estradiol Burr Medico) 150-35 MCG/24HR transdermal patch       Meds ordered this encounter  Medications   norelgestromin-ethinyl estradiol (  Burr Medico) 150-35 MCG/24HR transdermal patch    Sig: Place 1 patch onto the skin once a week.    Dispense:  3 patch    Refill:  12    Reviewed symptoms could be a combination of recently being pregnant and anxiety. Ways to manage anxiety discussed, encouraged pt to not use another person's prescriptions. Referral to [facility] made.   No follow-ups on file.  Ellouise Newer Ferrin Liebig, CNM

## 2022-09-18 LAB — CBC
Hematocrit: 36.7 % (ref 34.0–46.6)
Hemoglobin: 12 g/dL (ref 11.1–15.9)
MCH: 28 pg (ref 26.6–33.0)
MCHC: 32.7 g/dL (ref 31.5–35.7)
MCV: 86 fL (ref 79–97)
Platelets: 358 10*3/uL (ref 150–450)
RBC: 4.28 x10E6/uL (ref 3.77–5.28)
RDW: 12.2 % (ref 11.7–15.4)
WBC: 9.4 10*3/uL (ref 3.4–10.8)

## 2022-09-18 LAB — TSH+FREE T4
Free T4: 1.14 ng/dL (ref 0.82–1.77)
TSH: 0.782 u[IU]/mL (ref 0.450–4.500)

## 2022-09-24 ENCOUNTER — Other Ambulatory Visit: Payer: Self-pay | Admitting: Licensed Practical Nurse

## 2022-09-24 DIAGNOSIS — R519 Headache, unspecified: Secondary | ICD-10-CM

## 2022-09-24 NOTE — Progress Notes (Signed)
Pt sent mychart message stating her birth control patch fell off while she was sleeping.  TC to [facility] tried to reapply the patch, but it would not stick. She was not sure what to do,. She does have pills, she would like to start those. Reviewed most recent visit, referral to neuro placed for frequent headaches. Referral to psych was placed, will send message to Arlisha to follow up on these.  Carie Caddy, CNM   [facility]  09/24/22  5:08 PM

## 2022-09-27 ENCOUNTER — Encounter: Payer: Self-pay | Admitting: Neurology

## 2022-12-15 NOTE — Telephone Encounter (Signed)
Patient requesting refill of pills. Reviewed chart and inquired if she is on pills or patch as last message was not complete. Per patient, Jennifer [name] called to advise her to restart pills and d/c patch. [facility] rx was sent 08/19/22 #84 w/3 refills. Epic shows that rx was d/c 9/6 when [place] sent rx for patch. Patient advised to contact pharmacy to see if they still have rx for pills on file, if not have them reach out to [place] for refill.

## 2022-12-17 NOTE — Progress Notes (Unsigned)
NEUROLOGY CONSULTATION NOTE  Jennifer [name] MRN: [id number] DOB: 29-Jul-1995  Referring provider: Ellwood Sayers, CNM Primary care provider: Les Pou, MD  Reason for consult:  headache  Assessment/Plan:   ***   Subjective:  Jennifer [name] is a 27 year old ***-handed female with ADHD, anxiety, and depression and iron-deficiency anemia who presents for headaches.  History supplemented by ED, urgent care and OBGYN notes.  Onset:  *** Location:  *** Quality:  *** Intensity:  ***.  *** denies new headache, thunderclap headache or severe headache that wakes *** from sleep. Aura:  *** Prodrome:  *** Postdrome:  *** Associated symptoms:  ***.  *** denies associated unilateral numbness or weakness. Duration:  *** Frequency:  *** Frequency of abortive medication: *** Triggers:  *** Relieving factors:  *** Activity:  ***  Seen in ED on 09/26/2022.  CT head ***.  Treated with headache cocktail (Toradol 15mg , Reglan 10mg , Benadryl 25mg , Decadron 4mg ).    Past NSAIDS/analgesics:  Fioricet Past abortive triptans:  *** Past abortive ergotamine:  *** Past muscle relaxants:  *** Past anti-emetic:  *** Past antihypertensive medications:  *** Past antidepressant medications:  *** Past anticonvulsant medications:  *** Past anti-CGRP:  *** Past vitamins/Herbal/Supplements:  *** Past antihistamines/decongestants:  *** Other past therapies:  ***  Current NSAIDS/analgesics:  *** Current triptans:  sumatriptan 50mg  Current ergotamine:  none Current anti-emetic:  Reglan 5mg  Current muscle relaxants:  none Current Antihypertensive medications:  propranolol 20mg  twice daily Current Antidepressant medications:  none Current Anticonvulsant medications:  none Current anti-CGRP:  none Current Vitamins/Herbal/Supplements:  none Current Antihistamines/Decongestants:  Zyrtec, FLonase Other therapy:  *** Birth control:  Junel Fe Other medications:   hydroxyzine   Caffeine:  *** Alcohol:  *** Smoker:  Marijuana *** Diet:  *** Exercise:  *** Depression:  ***; Anxiety:  *** Other pain:  *** Sleep hygiene:  *** Family history of headache:  ***      PAST MEDICAL HISTORY: Past Medical History:  Diagnosis Date   ADHD (attention deficit hyperactivity disorder), combined type 10/27/2017   by history/ Kathreen Cosier LCSW   Adjustment disorder with mixed anxiety and depressed mood 10/27/2017   Kathreen Cosier LCSW   Anemia    Anxiety    Environmental and seasonal allergies    Lost custody of children 02/28/2018   Loss custody of 2 children/per Arnetha Courser CNM    PAST SURGICAL HISTORY: Past Surgical History:  Procedure Laterality Date   WISDOM TOOTH EXTRACTION     age 66 - all four    MEDICATIONS: Current Outpatient Medications on File Prior to Visit  Medication Sig Dispense Refill   metroNIDAZOLE (FLAGYL) 500 MG tablet Take 1 tablet (500 mg total) by mouth 2 (two) times daily. (Patient not taking: Reported on 08/19/2022) 14 tablet 0   Prenatal Vit-Fe Fumarate-FA (PRENATAL VITAMINS) 28-0.8 MG TABS Take 1 tablet by mouth daily. (Patient not taking: Reported on 09/17/2022)     No current facility-administered medications on file prior to visit.    ALLERGIES: Allergies  Allergen Reactions   Hydrocodone Hives and Itching   Vicodin [Hydrocodone-Acetaminophen] Itching   Fruit Extracts Other (See Comments)    Pt states that she is allergic to all fruits including avocado's and fruits with seeds. The reaction changes the appearance of her tongue to a red, cracked appearance    FAMILY HISTORY: Family History  Problem Relation Age of Onset   Hypertension Maternal Grandmother    Breast cancer Other  unknown age   Cancer Other 81       lung    Objective:  *** General: No acute distress.  Patient appears well-groomed.   Head:  Normocephalic/atraumatic Eyes:  fundi examined but not visualized Neck: supple, no  paraspinal tenderness, full range of motion Back: No paraspinal tenderness Heart: regular rate and rhythm Lungs: Clear to auscultation bilaterally. Vascular: No carotid bruits. Neurological Exam: Mental status: alert and oriented to person, place, and time, speech fluent and not dysarthric, language intact. Cranial nerves: CN I: not tested CN II: pupils equal, round and reactive to light, visual fields intact CN III, IV, VI:  full range of motion, no nystagmus, no ptosis CN V: facial sensation intact. CN VII: upper and lower face symmetric CN VIII: hearing intact CN IX, X: gag intact, uvula midline CN XI: sternocleidomastoid and trapezius muscles intact CN XII: tongue midline Bulk & Tone: normal, no fasciculations. Motor:  muscle strength 5/5 throughout Sensation:  Pinprick, temperature and vibratory sensation intact. Deep Tendon Reflexes:  2+ throughout,  toes downgoing.   Finger to nose testing:  Without dysmetria.   Heel to shin:  Without dysmetria.   Gait:  Normal station and stride.  Romberg negative.    Thank you for allowing me to take part in the care of this patient.  Shon Millet, DO  CC: ***

## 2022-12-20 ENCOUNTER — Ambulatory Visit: Payer: Medicaid Other | Admitting: Neurology

## 2022-12-22 ENCOUNTER — Other Ambulatory Visit: Payer: Self-pay

## 2023-04-17 NOTE — Progress Notes (Deleted)
 NEUROLOGY CONSULTATION NOTE  CLYDETTE PRIVITERA MRN: [id number] DOB: Dec 17, 1995  Referring provider: Les Pou, MD Primary care provider: Les Pou, MD  Reason for consult:  migraines  Assessment/Plan:   ***   Subjective:  Jennifer [name] is a 28 year old ***-handed female with ADHD, anxiety and depression who presents for migraines.  History supplemented by ED and primary care notes.  Onset:  History of headaches intermittently since ***.  They became daily on 09/17/2022.  ***.   Location:  *** Quality:  *** Intensity:  *** Prodrome:  *** Aura:  *** Associated symptoms:  *** Duration:  *** Frequency:  *** Triggers/aggravating factors:  *** Relieving factors:  *** Activity:  ***  She has been to the ED and followed up with primary care on several occasions.  CT head on 09/26/2022 was unremarkable.  Due to endorsing upper respiratory congestion, she was treated for sinusitis with Augmentin which ***  Onset:  09/17/2022 Location:  *** Quality:  *** Intensity:  ***.  *** denies new headache, thunderclap headache or severe headache that wakes *** from sleep. Aura:  *** Prodrome:  *** Postdrome:  *** Associated symptoms:  ***.  *** denies associated unilateral numbness or weakness. Duration:  *** Frequency:  *** Frequency of abortive medication: *** Triggers:  *** Relieving factors:  *** Activity:  ***  Past NSAIDS/analgesics:  *** Past abortive triptans:  *** Past abortive ergotamine:  *** Past muscle relaxants:  *** Past anti-emetic:  *** Past antihypertensive medications:  *** Past antidepressant medications:  *** Past anticonvulsant medications:  *** Past anti-CGRP:  *** Past vitamins/Herbal/Supplements:  *** Past antihistamines/decongestants:  Zyrtec Other past therapies:  ***  Current NSAIDS/analgesics:  Fioricet, Excedrin Current triptans:  sumatriptan 50mg  Current ergotamine:  *** Current anti-emetic:  Reglan 10mg  Current muscle  relaxants:  *** Current Antihypertensive medications:  propranolol 20mg  twice daily Current Antidepressant medications:  *** Current Anticonvulsant medications:  *** Current anti-CGRP:  *** Current Vitamins/Herbal/Supplements:  prenatal Current Antihistamines/Decongestants:  Flonase Other therapy:  *** Birth control:  JUNEL-FE Other medications: ***   Caffeine:  *** Alcohol:  *** Smoker:  *** Diet:  *** Exercise:  *** Depression:  ***; Anxiety:  *** Other pain:  *** Sleep hygiene:  *** Family history of headache:  ***      PAST MEDICAL HISTORY: Past Medical History:  Diagnosis Date   ADHD (attention deficit hyperactivity disorder), combined type 10/27/2017   by history/ Kathreen Cosier LCSW   Adjustment disorder with mixed anxiety and depressed mood 10/27/2017   Kathreen Cosier LCSW   Anemia    Anxiety    Environmental and seasonal allergies    Lost custody of children 02/28/2018   Loss custody of 2 children/per Arnetha Courser CNM    PAST SURGICAL HISTORY: Past Surgical History:  Procedure Laterality Date   WISDOM TOOTH EXTRACTION     age 49 - all four    MEDICATIONS: Current Outpatient Medications on File Prior to Visit  Medication Sig Dispense Refill   HAILEY 24 FE 1-20 MG-MCG(24) tablet TAKE 1 TABLET BY MOUTH EVERY DAY 84 tablet 3   metroNIDAZOLE (FLAGYL) 500 MG tablet Take 1 tablet (500 mg total) by mouth 2 (two) times daily. (Patient not taking: Reported on 08/19/2022) 14 tablet 0   No current facility-administered medications on file prior to visit.    ALLERGIES: Allergies  Allergen Reactions   Hydrocodone Hives and Itching   Vicodin [Hydrocodone-Acetaminophen] Itching   Fruit Extracts Other (See Comments)  Pt states that she is allergic to all fruits including avocado's and fruits with seeds. The reaction changes the appearance of her tongue to a red, cracked appearance    FAMILY HISTORY: Family History  Problem Relation Age of Onset    Hypertension Maternal Grandmother    Breast cancer Other        unknown age   Cancer Other 35       lung    Objective:  *** General: No acute distress.  Patient appears well-groomed.   Head:  Normocephalic/atraumatic Eyes:  fundi examined but not visualized Neck: supple, no paraspinal tenderness, full range of motion Heart: regular rate and rhythm Neurological Exam: Mental status: alert and oriented to person, place, and time, speech fluent and not dysarthric, language intact. Cranial nerves: CN I: not tested CN II: pupils equal, round and reactive to light, visual fields intact CN III, IV, VI:  full range of motion, no nystagmus, no ptosis CN V: facial sensation intact. CN VII: upper and lower face symmetric CN VIII: hearing intact CN IX, X: gag intact, uvula midline CN XI: sternocleidomastoid and trapezius muscles intact CN XII: tongue midline Bulk & Tone: normal, no fasciculations. Motor:  muscle strength 5/5 throughout Sensation:  Pinprick and vibratory sensation intact. Deep Tendon Reflexes:  2+ throughout,  toes downgoing.   Finger to nose testing:  Without dysmetria.   Gait:  Normal station and stride.  Romberg negative.    Thank you for allowing me to take part in the care of this patient.  Shon Millet, DO  CC: Les Pou, MD

## 2023-04-18 ENCOUNTER — Ambulatory Visit: Payer: Medicaid Other | Admitting: Neurology

## 2023-05-30 ENCOUNTER — Encounter: Payer: Self-pay | Admitting: Emergency Medicine

## 2023-05-30 ENCOUNTER — Ambulatory Visit: Admission: EM | Admit: 2023-05-30 | Discharge: 2023-05-30 | Disposition: A | Discharge status: Home / Self Care

## 2023-05-30 DIAGNOSIS — J069 Acute upper respiratory infection, unspecified: Secondary | ICD-10-CM | POA: Insufficient documentation

## 2023-05-30 DIAGNOSIS — Z87891 Personal history of nicotine dependence: Secondary | ICD-10-CM | POA: Insufficient documentation

## 2023-05-30 DIAGNOSIS — R059 Cough, unspecified: Secondary | ICD-10-CM | POA: Diagnosis not present

## 2023-05-30 DIAGNOSIS — J029 Acute pharyngitis, unspecified: Secondary | ICD-10-CM | POA: Diagnosis present

## 2023-05-30 LAB — POCT RAPID STREP A (OFFICE): Rapid Strep A Screen: NEGATIVE

## 2023-05-30 MED ORDER — LIDOCAINE VISCOUS HCL 2 % MT SOLN: 15.0000 mL | OROMUCOSAL | 0 refills | Status: DC | PRN

## 2023-05-30 MED ORDER — PREDNISONE 10 MG (21) PO TBPK: ORAL_TABLET | Freq: Every day | ORAL | 0 refills | Status: AC

## 2023-05-30 MED ORDER — PREDNISONE 10 MG (21) PO TBPK: ORAL_TABLET | Freq: Every day | ORAL | 0 refills | Status: DC

## 2023-05-30 MED ORDER — LIDOCAINE VISCOUS HCL 2 % MT SOLN: 15.0000 mL | OROMUCOSAL | 0 refills | Status: AC | PRN

## 2023-05-30 NOTE — ED Triage Notes (Signed)
 Pt reports was seen at [facility] yesterday because been sick, covid, flu, strep tests all negative. Woke up today with fever and severe sore throat.

## 2023-05-30 NOTE — ED Provider Notes (Signed)
 Jennifer [name]    CSN: [id number] Arrival date & time: 05/30/23  1945      History   Chief Complaint Chief Complaint  Patient presents with   Sore Throat    HPI Jennifer [name] is a 28 y.o. female.   Patient presents for evaluation of a fever peaking at 102, chills, nasal congestion, productive cough, sore throat, diarrhea and lymph node swelling beginning 1 day ago.  No known sick contacts within household.  Tolerating food and liquids.  Endorses that she [facility] and all testing negative however sore throat has progressively worsened overnight.  Has attempted use of painquil.   Past Medical History:  Diagnosis Date   ADHD (attention deficit hyperactivity disorder), combined type 10/27/2017   by history/ Jennifer Belling LCSW   Adjustment disorder with mixed anxiety and depressed mood 10/27/2017   Jennifer Belling LCSW   Anemia    Anxiety    Environmental and seasonal allergies    Lost custody of children 02/28/2018   Loss custody of 2 children/per Jennifer [name] [name]    Patient Active Problem List   Diagnosis Date Noted   Seasonal allergies 06/01/2021   Tobacco use during pregnancy, antepartum 03/07/2018   Iron  deficiency anemia 07/05/2017   Rh negative state in antepartum period 05/09/2017   Marijuana use 03/18/2017   ADHD (attention deficit hyperactivity disorder) 11/20/2013   Allergic rhinitis 11/20/2013    Past Surgical History:  Procedure Laterality Date   WISDOM TOOTH EXTRACTION     age 28 - all four    OB History     Gravida  6   Para  4   Term  4   Preterm      AB  1   Living  4      SAB  0   IAB  1   Ectopic      Multiple  0   Live Births  4        Obstetric Comments  IOL for postdates with G1          Home Medications    Prior to Admission medications   Medication Sig Start Date End Date Taking? Authorizing Provider  fluticasone (FLONASE) 50 MCG/ACT nasal spray Place 2 sprays into the nose.  01/24/23  Yes Provider, Historical, [name]  Jennifer [name] 1-20 MG-MCG(24) tablet TAKE 1 TABLET BY MOUTH EVERY DAY 12/22/22   Jennifer [name]  lidocaine  (XYLOCAINE ) 2 % solution Use as directed 15 mLs in the mouth or throat every 4 (four) hours as needed. 05/30/23   Jennifer [name]  metroNIDAZOLE  (FLAGYL ) 500 MG tablet Take 1 tablet (500 mg total) by mouth 2 (two) times daily. Patient not taking: Reported on 08/19/2022 08/13/22   Jennifer [name]  predniSONE  (STERAPRED UNI-PAK 21 TAB) 10 MG (21) TBPK tablet Take by mouth daily. Take 6 tabs by mouth daily  for 1 days, then 5 tabs for 1 days, then 4 tabs for 1 days, then 3 tabs for 1 days, 2 tabs for 1 days, then 1 tab by mouth daily for 1 days 05/30/23   Jennifer [name]    Family History Family History  Problem Relation Age of Onset   Hypertension Maternal Grandmother    Breast cancer Other        unknown age   Cancer Other 90       lung    Social History Social History  Tobacco Use   Smoking status: Former    Current packs/day: 0.00    Types: Cigarettes    Quit date: 04/28/2015    Years since quitting: 8.0   Smokeless tobacco: Never   Tobacco comments:    Not since 2021  Vaping Use   Vaping status: Former  Substance Use Topics   Alcohol use: Not Currently   Drug use: Yes    Types: Marijuana     Allergies   Hydrocodone, Vicodin [hydrocodone-acetaminophen ], and Fruit extracts   Review of Systems Review of Systems   Physical Exam Triage Vital Signs ED Triage Vitals  Encounter Vitals Group     BP 05/30/23 1952 106/75     Systolic BP Percentile --      Diastolic BP Percentile --      Pulse Rate 05/30/23 1952 (!) 115     Resp 05/30/23 1952 18     Temp 05/30/23 1952 98.6 F (37 C)     Temp Source 05/30/23 1952 Oral     SpO2 05/30/23 1952 97 %     Weight --      Height --      Head Circumference --      Peak Flow --      Pain Score 05/30/23 1949 10     Pain Loc --      Pain Education --       Exclude from Growth Chart --    No data found.  Updated Vital Signs BP 106/75 (BP Location: Left Arm)   Pulse (!) 115   Temp 98.6 F (37 C) (Oral)   Resp 18   LMP 05/29/2023   SpO2 97%   Visual Acuity Right Eye Distance:   Left Eye Distance:   Bilateral Distance:    Right Eye Near:   Left Eye Near:    Bilateral Near:     Physical Exam Constitutional:      Appearance: Normal appearance.  HENT:     Head: Normocephalic.     Right Ear: Tympanic membrane and ear canal normal.     Left Ear: Tympanic membrane and ear canal normal.     Nose: Congestion present.     Mouth/Throat:     Pharynx: No posterior oropharyngeal erythema.     Tonsils: Tonsillar exudate present. 3+ on the right. 3+ on the left.  Eyes:     Extraocular Movements: Extraocular movements intact.  Cardiovascular:     Rate and Rhythm: Normal rate and regular rhythm.     Pulses: Normal pulses.     Heart sounds: Normal heart sounds.  Pulmonary:     Effort: Pulmonary effort is normal.     Breath sounds: Normal breath sounds.  Musculoskeletal:     Cervical back: Normal range of motion.  Lymphadenopathy:     Cervical: Cervical adenopathy present.  Neurological:     Mental Status: She is alert and oriented to person, place, and time. Mental status is at baseline.      UC Treatments / Results  Labs (all labs ordered are listed, but only abnormal results are displayed) Labs Reviewed  CULTURE, GROUP A STREP [facility])  POCT RAPID STREP A (OFFICE)    EKG   Radiology No results found.  Procedures Procedures (including critical care time)  Medications Ordered in UC Medications - No data to display  Initial Impression / Assessment and Plan / UC Course  I have reviewed the triage vital signs and the nursing notes.  Pertinent labs &  imaging results that were available during my care of the patient were reviewed by me and considered in my medical decision making (see chart for details).  Viral URI with  cough, sore throat  Patient is in no signs of distress nor toxic appearing.  Vital signs are stable.  Low suspicion for pneumonia, pneumothorax or bronchitis and therefore will defer imaging.  Strep test negative, sent for culture.  Discussed findings.  Prescribed prednisone  and viscous lidocaine .May use additional over-the-counter medications as needed for supportive care.  May follow-up with urgent care as needed if symptoms persist or worsen.   Final Clinical Impressions(s) / UC Diagnoses   Final diagnoses:  Sore throat  Viral URI with cough   Discharge Instructions      Your symptoms today are most likely being caused by a virus and should steadily improve in time it can take up to 7 to 10 days before you truly start to see a turnaround however things will get better   Rapid strep test is negative, has been sent to the lab to see a bacterial growth, you will be notified if this occurs  Starting tomorrow take prednisone  every morning with food as directed to help with pain and inflammation, may take Tylenol  in addition to this  May gargle and spit Magic mouthwash solution every 4 hours as needed to help temporarily provide relief to the throat    You can take Tylenol  and/or Ibuprofen  as needed for fever reduction and pain relief.   For cough: honey 1/2 to 1 teaspoon (you can dilute the honey in water or another fluid).  You can also use guaifenesin and dextromethorphan for cough. You can use a humidifier for chest congestion and cough.  If you don't have a humidifier, you can sit in the bathroom with the hot shower running.      For sore throat: try warm salt water gargles, cepacol lozenges, throat spray, warm tea or water with lemon/honey, popsicles or ice, or OTC cold relief medicine for throat discomfort.   For congestion: take a daily anti-histamine like Zyrtec, Claritin, and a oral decongestant, such as pseudoephedrine.  You can also use Flonase 1-2 sprays in each nostril daily.    It is important to stay hydrated: drink plenty of fluids (water, gatorade/powerade/pedialyte, juices, or teas) to keep your throat moisturized and help further relieve irritation/discomfort.    ED Prescriptions     Medication Sig Dispense Auth. Provider   predniSONE  (STERAPRED UNI-PAK 21 TAB) 10 MG (21) TBPK tablet  (Status: Discontinued) Take by mouth daily. Take 6 tabs by mouth daily  for 1 days, then 5 tabs for 1 days, then 4 tabs for 1 days, then 3 tabs for 1 days, 2 tabs for 1 days, then 1 tab by mouth daily for 1 days 21 tablet Adonte Vanriper R, [name]   lidocaine  (XYLOCAINE ) 2 % solution  (Status: Discontinued) Use as directed 15 mLs in the mouth or throat every 4 (four) hours as needed. 100 mL Royden Bulman R, [name]   lidocaine  (XYLOCAINE ) 2 % solution Use as directed 15 mLs in the mouth or throat every 4 (four) hours as needed. 100 mL Jim Lundin R, [name]   predniSONE  (STERAPRED UNI-PAK 21 TAB) 10 MG (21) TBPK tablet Take by mouth daily. Take 6 tabs by mouth daily  for 1 days, then 5 tabs for 1 days, then 4 tabs for 1 days, then 3 tabs for 1 days, 2 tabs for 1 days, then 1 tab by  mouth daily for 1 days 21 tablet Amarri Michaelson, Maybelle Spatz, [name]      PDMP not reviewed this encounter.   Jennifer [name], [place] 05/31/23 [phone]

## 2023-05-30 NOTE — Discharge Instructions (Addendum)
 Your symptoms today are most likely being caused by a virus and should steadily improve in time it can take up to 7 to 10 days before you truly start to see a turnaround however things will get better   Rapid strep test is negative, has been sent to the lab to see a bacterial growth, you will be notified if this occurs  Starting tomorrow take prednisone  every morning with food as directed to help with pain and inflammation, may take Tylenol  in addition to this  May gargle and spit Magic mouthwash solution every 4 hours as needed to help temporarily provide relief to the throat    You can take Tylenol  and/or Ibuprofen  as needed for fever reduction and pain relief.   For cough: honey 1/2 to 1 teaspoon (you can dilute the honey in water or another fluid).  You can also use guaifenesin and dextromethorphan for cough. You can use a humidifier for chest congestion and cough.  If you don't have a humidifier, you can sit in the bathroom with the hot shower running.      For sore throat: try warm salt water gargles, cepacol lozenges, throat spray, warm tea or water with lemon/honey, popsicles or ice, or OTC cold relief medicine for throat discomfort.   For congestion: take a daily anti-histamine like Zyrtec, Claritin, and a oral decongestant, such as pseudoephedrine.  You can also use Flonase 1-2 sprays in each nostril daily.   It is important to stay hydrated: drink plenty of fluids (water, gatorade/powerade/pedialyte, juices, or teas) to keep your throat moisturized and help further relieve irritation/discomfort.

## 2023-06-02 ENCOUNTER — Ambulatory Visit (HOSPITAL_COMMUNITY): Payer: Self-pay

## 2023-06-02 LAB — CULTURE, GROUP A STREP (THRC)

## 2023-08-03 ENCOUNTER — Ambulatory Visit: Admitting: Certified Nurse Midwife

## 2023-08-03 NOTE — Progress Notes (Deleted)
    GYNECOLOGY PROGRESS NOTE  Subjective:    Patient ID: Jennifer [name], female    DOB: June 17, 1995, 28 y.o.   MRN: [id number]  HPI  Patient is a 28 y.o. [id number] female who presents for evaluation of abdominal pain. Pain is located on the lower left side of her abdomen.  {Common ambulatory SmartLinks:19316}  Review of Systems {ros; complete:30496}   Objective:   unknown if currently breastfeeding. There is no height or weight on file to calculate BMI. General appearance: {general exam:16600} Abdomen: {abdominal exam:16834} Pelvic: {pelvic exam:16852::cervix normal in appearance,external genitalia normal,no adnexal masses or tenderness,no cervical motion tenderness,rectovaginal septum normal,uterus normal size, shape, and consistency,vagina normal without discharge} Extremities: {extremity exam:5109} Neurologic: {neuro exam:17854}   Assessment:   No diagnosis found.   Plan:   There are no diagnoses linked to this encounter.    Damien Parsley, CNM [facility]

## 2023-08-29 ENCOUNTER — Other Ambulatory Visit (HOSPITAL_COMMUNITY)
Admission: RE | Admit: 2023-08-29 | Discharge: 2023-08-29 | Disposition: A | Discharge status: Home / Self Care | Source: Ambulatory Visit | Attending: Licensed Practical Nurse | Admitting: Licensed Practical Nurse

## 2023-08-29 ENCOUNTER — Ambulatory Visit (INDEPENDENT_AMBULATORY_CARE_PROVIDER_SITE_OTHER): Admitting: Licensed Practical Nurse

## 2023-08-29 VITALS — BP 107/75 | HR 89 | Ht 62.0 in | Wt 190.3 lb

## 2023-08-29 DIAGNOSIS — Z113 Encounter for screening for infections with a predominantly sexual mode of transmission: Secondary | ICD-10-CM

## 2023-08-29 DIAGNOSIS — R1030 Lower abdominal pain, unspecified: Secondary | ICD-10-CM | POA: Diagnosis not present

## 2023-08-29 DIAGNOSIS — K921 Melena: Secondary | ICD-10-CM | POA: Diagnosis not present

## 2023-08-29 MED ORDER — HAILEY 24 FE 1-20 MG-MCG(24) PO TABS: 1.0000 | ORAL_TABLET | Freq: Every day | ORAL | 3 refills | Status: AC

## 2023-08-31 LAB — CERVICOVAGINAL ANCILLARY ONLY
Bacterial Vaginitis (gardnerella): NEGATIVE
Candida Glabrata: NEGATIVE
Candida Vaginitis: NEGATIVE
Chlamydia: NEGATIVE
Comment: NEGATIVE
Comment: NEGATIVE
Comment: NEGATIVE
Comment: NEGATIVE
Comment: NEGATIVE
Comment: NORMAL
Neisseria Gonorrhea: NEGATIVE
Trichomonas: NEGATIVE

## 2023-09-01 ENCOUNTER — Ambulatory Visit: Payer: Self-pay | Admitting: Licensed Practical Nurse

## 2023-09-03 NOTE — Progress Notes (Signed)
 [name], MD   No chief complaint on file.   HPI:      Jennifer [name] is a 28 y.o. [phone] whose LMP was No LMP recorded (lmp unknown)., presents today for lower abdominal pain x 3 to 6 years, blood in her stool and a ball in her vagina..   Lower Abdominal pain: Jennifer [name] reports that she has had a stabbing pain that is worse than a cramp in her lower abdomen for the last few years. The pain tends to be on one side or the other, it can last all day then just goes away, the pain is worse after sex. But the pain does not occur with every episode of IC.   She is sexually active with 1 female partner, she has not been on hormonal contraception for the last 2 months. She is not sure if she noticed a difference in pain on or off hormones.  Cycles are monthly, last 5 days, the flow is not bad she uses regular tampons, she does not usually gets cramps.    She had an US  in 2020 for Pelvic pain  that was normal.   Bloody stool: Lately she has noticed rectal bleeding after having a bowel movement. She tends to have 2-3 BM's daily, she does not strain, she saw blood in her stool 3 times last week. Denies concerns for constipation.  She was previously told by her PCP that she has hemorrhoids.   Ball in her vagina Jennifer [name] reports that after sitting on the toilet for some time a flesh like ball protrudes through her vagina, the ball is not always visible. She has had 4 vaginal births, did not have any repair of lacerations with any of them.      Patient Active Problem List   Diagnosis Date Noted   Seasonal allergies 06/01/2021   Tobacco use during pregnancy, antepartum 03/07/2018   Iron  deficiency anemia 07/05/2017   Rh negative state in antepartum period 05/09/2017   Marijuana use 03/18/2017   ADHD (attention deficit hyperactivity disorder) 11/20/2013   Allergic rhinitis 11/20/2013    Past Surgical History:  Procedure Laterality Date   WISDOM TOOTH EXTRACTION     age  89 - all four    Family History  Problem Relation Age of Onset   Hypertension Maternal Grandmother    Breast cancer Other        unknown age   Cancer Other 15       lung    Social History   Socioeconomic History   Marital status: Single    Spouse name: Not on file   Number of children: 3   Years of education: 11   Highest education level: Not on file  Occupational History   Occupation: stay at home mom  Tobacco Use   Smoking status: Former    Current packs/day: 0.00    Types: Cigarettes    Quit date: 04/28/2015    Years since quitting: 8.3   Smokeless tobacco: Never   Tobacco comments:    Not since 2021  Vaping Use   Vaping status: Former  Substance and Sexual Activity   Alcohol use: Not Currently   Drug use: Yes    Types: Marijuana   Sexual activity: Yes    Partners: Male    Birth control/protection: None  Other Topics Concern   Not on file  Social History Narrative   Not on file   Social Drivers of Health   Financial Resource Strain:  Low Risk  (09/16/2021)   Overall Financial Resource Strain (CARDIA)    Difficulty of Paying Living Expenses: Not hard at all  Food Insecurity: No Food Insecurity (04/27/2022)   Hunger Vital Sign    Worried About Running Out of Food in the Last Year: Never true    Ran Out of Food in the Last Year: Never true  Transportation Needs: No Transportation Needs (04/27/2022)   PRAPARE - Administrator, Civil Service (Medical): No    Lack of Transportation (Non-Medical): No  Physical Activity: Insufficiently Active (09/16/2021)   Exercise Vital Sign    Days of Exercise per Week: 2 days    Minutes of Exercise per Session: 60 min  Stress: No Stress Concern Present (09/16/2021)   [employer] of Occupational Health - Occupational Stress Questionnaire    Feeling of Stress : Not at all  Social Connections: Moderately Isolated (09/16/2021)   Social Connection and Isolation Panel    Frequency of Communication with Friends and  Family: More than three times a week    Frequency of Social Gatherings with Friends and Family: Once a week    Attends Religious Services: Never    Database administrator or Organizations: No    Attends Banker Meetings: Never    Marital Status: Living with partner  Intimate Partner Violence: Not At Risk (02/05/2022)   Humiliation, Afraid, Rape, and Kick questionnaire    Fear of Current or Ex-Partner: No    Emotionally Abused: No    Physically Abused: No    Sexually Abused: No    Outpatient Medications Prior to Visit  Medication Sig Dispense Refill   fluticasone (FLONASE) 50 MCG/ACT nasal spray Place 2 sprays into the nose.     lidocaine  (XYLOCAINE ) 2 % solution Use as directed 15 mLs in the mouth or throat every 4 (four) hours as needed. (Patient not taking: Reported on 08/29/2023) 100 mL 0   metroNIDAZOLE  (FLAGYL ) 500 MG tablet Take 1 tablet (500 mg total) by mouth 2 (two) times daily. (Patient not taking: Reported on 08/29/2023) 14 tablet 0   predniSONE  (STERAPRED UNI-PAK 21 TAB) 10 MG (21) TBPK tablet Take by mouth daily. Take 6 tabs by mouth daily  for 1 days, then 5 tabs for 1 days, then 4 tabs for 1 days, then 3 tabs for 1 days, 2 tabs for 1 days, then 1 tab by mouth daily for 1 days (Patient not taking: Reported on 08/29/2023) 21 tablet 0   HAILEY  24 FE 1-20 MG-MCG(24) tablet TAKE 1 TABLET BY MOUTH EVERY DAY 84 tablet 3   No facility-administered medications prior to visit.      ROS:  Review of Systems see HPI    OBJECTIVE:   Vitals:  BP 107/75 (BP Location: Left Arm, Patient Position: Sitting, Cuff Size: Normal)   Pulse 89   Ht 5' 2 (1.575 m)   Wt 190 lb 4.8 oz (86.3 kg)   LMP  (LMP Unknown)   BMI 34.81 kg/m   Physical Exam Constitutional:      Appearance: Normal appearance.  Cardiovascular:     Rate and Rhythm: Normal rate.  Pulmonary:     Effort: Pulmonary effort is normal.  Abdominal:     General: Abdomen is flat. There is no distension.      Tenderness: There is no abdominal tenderness. There is no guarding.     Hernia: No hernia is present.  Genitourinary:    General: Normal vulva.  Comments: SSE: cervix pink, no lesions no bleeding  Bimanual exam: uterus non gravid, non tender no masses, adnexa non tender no masses not enlarged, no CMT   No obvious masses in vagina or labia, No obvious prolapse  Musculoskeletal:     Cervical back: Normal range of motion.  Neurological:     General: No focal deficit present.     Mental Status: She is alert.  Psychiatric:        Mood and Affect: Mood normal.        Thought Content: Thought content normal.     Results: No results found for this or any previous visit (from the past 24 hours).   Assessment/Plan: Lower abdominal pain - Plan: US  PELVIS TRANSVAGINAL NON-OB (TV ONLY)  Screening examination for STD (sexually transmitted disease) - Plan: Cervicovaginal ancillary only  Bloody stools - Plan: Ambulatory referral to Gastroenterology    Meds ordered this encounter  Medications   Norethindrone Acetate-Ethinyl Estrad-FE (HAILEY  24 FE) 1-20 MG-MCG(24) tablet    Sig: Take 1 tablet by mouth daily.    Dispense:  84 tablet    Refill:  3   Lower abd pain -swabs collected to rule out infection -Pelvic US  ordered -May start OCP to see if that improves the pain, if this were related to cysts or endo -Low suspicion for endo, but could be, discussed this is diagnosed through a surgical procedure.   Rectal  Bleeding -Most likely is related to hemorrhoids, not seen today but could be internal, but you should be evaluated, rec seeing PCP,  GI referral placed  Ball in vagina -There was no obvious mass seen today, please take a picture the next time this happens.    JINNIE HERO Renda Pohlman, CNM 09/03/2023 10:06 AM

## 2023-09-07 ENCOUNTER — Telehealth: Payer: Self-pay

## 2023-09-07 NOTE — Telephone Encounter (Addendum)
 Received call on triage line from patient who had a positive pregnancy test at home.  She does not know her LMP - has not had a period in many months.  She was seen here 8/18 but no pregnancy test was done.  She is experiencing some light pink spotting that has turned a brown color and wonders if this is normal.  She is not having any cramping or passing clots. Advised light bleeding in the first trimester is normal.  Offered to transfer patient to the front desk to schedule a pregnancy confirmation visit but patient is unsure if she wants to continue this pregnancy.  Advised she is welcome to call back if she decides to continue the  pregnancy, otherwise patient was given information on pregnancy options in the area.

## 2023-09-14 ENCOUNTER — Other Ambulatory Visit

## 2023-09-15 NOTE — Progress Notes (Unsigned)
    GYNECOLOGY PROGRESS NOTE  Subjective:    Patient ID: [name], female    DOB: 1995/04/24, 28 y.o.   MRN: [id number]  HPI  Patient is a 28 y.o. [id number] female who presents for evaluation for possible miscarriage.  {Common ambulatory SmartLinks:19316}  Review of Systems {ros; complete:30496}   Objective:   unknown if currently breastfeeding. There is no height or weight on file to calculate BMI. General appearance: {general exam:16600} Abdomen: {abdominal exam:16834} Pelvic: {pelvic exam:16852::cervix normal in appearance,external genitalia normal,no adnexal masses or tenderness,no cervical motion tenderness,rectovaginal septum normal,uterus normal size, shape, and consistency,vagina normal without discharge} Extremities: {extremity exam:5109} Neurologic: {neuro exam:17854}   Assessment:   No diagnosis found.   Plan:   There are no diagnoses linked to this encounter.

## 2023-09-16 ENCOUNTER — Ambulatory Visit: Admitting: Certified Nurse Midwife

## 2023-09-16 VITALS — BP 104/76 | HR 107 | Resp 16 | Ht 62.0 in | Wt 188.8 lb

## 2023-09-16 DIAGNOSIS — O200 Threatened abortion: Secondary | ICD-10-CM

## 2023-09-16 DIAGNOSIS — Z3A01 Less than 8 weeks gestation of pregnancy: Secondary | ICD-10-CM

## 2023-09-17 LAB — BETA HCG QUANT (REF LAB): hCG Quant: 1515 m[IU]/mL

## 2023-09-18 ENCOUNTER — Ambulatory Visit: Payer: Self-pay | Admitting: Certified Nurse Midwife

## 2023-09-27 ENCOUNTER — Other Ambulatory Visit

## 2023-09-27 ENCOUNTER — Other Ambulatory Visit: Payer: Self-pay | Admitting: Certified Nurse Midwife

## 2023-09-27 ENCOUNTER — Ambulatory Visit

## 2023-09-27 VITALS — BP 103/71 | HR 91 | Ht 62.0 in | Wt 185.0 lb

## 2023-09-27 DIAGNOSIS — O26899 Other specified pregnancy related conditions, unspecified trimester: Secondary | ICD-10-CM | POA: Diagnosis not present

## 2023-09-27 DIAGNOSIS — O039 Complete or unspecified spontaneous abortion without complication: Secondary | ICD-10-CM

## 2023-09-27 DIAGNOSIS — Z3A00 Weeks of gestation of pregnancy not specified: Secondary | ICD-10-CM | POA: Diagnosis not present

## 2023-09-27 DIAGNOSIS — O200 Threatened abortion: Secondary | ICD-10-CM

## 2023-09-27 MED ORDER — RHO D IMMUNE GLOBULIN 1500 UNIT/2ML IJ SOSY
300.0000 ug | PREFILLED_SYRINGE | Freq: Once | INTRAMUSCULAR | Status: AC
Start: 2023-09-27 — End: 2023-09-27
  Administered 2023-09-27: 300 ug via INTRAMUSCULAR

## 2023-09-27 NOTE — Progress Notes (Signed)
    NURSE VISIT NOTE  Subjective:    Patient ID: Jennifer [name], female    DOB: 04/03/1995, 28 y.o.   MRN: [id number]  HPI  Patient is a 28 y.o. [id number] female who presents for Rhogam injection.   Objective:    Rhogam IM given by: Jennifer [name], CMA. Site: Right Upper Outer Quandrant   Assessment:   1. Rh negative state in antepartum period      Plan:   Follow-up as needed.    Jennifer [name], CMA

## 2023-09-28 LAB — BETA HCG QUANT (REF LAB): hCG Quant: 25408 m[IU]/mL

## 2023-09-29 ENCOUNTER — Other Ambulatory Visit

## 2023-09-30 ENCOUNTER — Other Ambulatory Visit

## 2023-10-05 ENCOUNTER — Other Ambulatory Visit

## 2023-10-06 ENCOUNTER — Ambulatory Visit
Admission: RE | Admit: 2023-10-06 | Discharge: 2023-10-06 | Disposition: A | Discharge status: Home / Self Care | Source: Ambulatory Visit | Attending: Licensed Practical Nurse | Admitting: Licensed Practical Nurse

## 2023-10-06 DIAGNOSIS — R1030 Lower abdominal pain, unspecified: Secondary | ICD-10-CM

## 2023-10-13 ENCOUNTER — Other Ambulatory Visit: Payer: Self-pay | Admitting: Certified Nurse Midwife

## 2023-10-13 ENCOUNTER — Other Ambulatory Visit

## 2023-10-13 DIAGNOSIS — O3680X0 Pregnancy with inconclusive fetal viability, not applicable or unspecified: Secondary | ICD-10-CM

## 2023-10-13 NOTE — Progress Notes (Signed)
 TC to [place], took misoprostol /mifepristone 3d ago and has not had a large amount of bleeding or passed tissue, concerned for retained tissue. Hcg ordered, lab only visit today.

## 2023-10-14 ENCOUNTER — Other Ambulatory Visit: Payer: Self-pay

## 2023-10-14 ENCOUNTER — Other Ambulatory Visit

## 2023-10-14 ENCOUNTER — Ambulatory Visit: Payer: Self-pay | Admitting: Certified Nurse Midwife

## 2023-10-14 DIAGNOSIS — O3680X0 Pregnancy with inconclusive fetal viability, not applicable or unspecified: Secondary | ICD-10-CM

## 2023-10-14 LAB — HUMAN CHORIONIC GONADOTROPIN(HCG),B-SUBUNIT,QUANTITATIVE): HCG, Beta Chain, Quant, S: 76654 m[IU]/mL

## 2023-10-14 NOTE — Progress Notes (Signed)
 Patient called with many questions regarding  her possible missed ab.  She took two sets of 800 mcg misoprostol  4 days ago and has had some bleeding but minimal cramping and very little clotting.  She got the medication from a telehealth provider and did not have an ultrasound prior to taking the pills.  She believes she was 11 weeks.  Beta levels 76,654 yesterday.  She is very concerned about whether this pregnancy is continuing to grow or if she will be able/need to have a D&C.  Patient message has been sent to [facility] for review.  Beta hcg ordered for Monday to see trend.  Advised Harlene will be reaching out when she is back in clinic but is not available today.

## 2023-10-17 ENCOUNTER — Telehealth: Payer: Self-pay | Admitting: Certified Nurse Midwife

## 2023-10-17 ENCOUNTER — Other Ambulatory Visit

## 2023-10-17 DIAGNOSIS — O3680X0 Pregnancy with inconclusive fetal viability, not applicable or unspecified: Secondary | ICD-10-CM

## 2023-10-17 NOTE — Telephone Encounter (Signed)
 Reached out to pt to reschedule lab appt that was scheduled on 10/17/2023 at 10:40.  Left message for pt to call back to reschedule.

## 2023-10-18 ENCOUNTER — Encounter: Payer: Self-pay | Admitting: Licensed Practical Nurse

## 2023-10-18 ENCOUNTER — Telehealth: Payer: Self-pay

## 2023-10-18 ENCOUNTER — Other Ambulatory Visit: Payer: Self-pay | Admitting: Licensed Practical Nurse

## 2023-10-18 ENCOUNTER — Telehealth: Payer: Self-pay | Admitting: Licensed Practical Nurse

## 2023-10-18 DIAGNOSIS — O034 Incomplete spontaneous abortion without complication: Secondary | ICD-10-CM

## 2023-10-18 LAB — BETA HCG QUANT (REF LAB): hCG Quant: 58706 m[IU]/mL

## 2023-10-18 NOTE — Telephone Encounter (Signed)
 Jennifer [name] called triage line stating she wants to discuss her labs and she said she would prefer to speak to [place].

## 2023-10-18 NOTE — Progress Notes (Signed)
 TC to Jennifer [name] Jennifer [name] had a medical abortion at the end of September, she did have some bleeding and passed small clots but never passed anything that looked like tissue. She is lightly bleeding now. Her beta on Oct 2 was 76,654 and yesterday it was 58,706. Jennifer [name] would like to know what to do now. She reports she has a D and C scheduled on Oct 15 with [facility]. On review of her chart, she has a visit with Dr Starla on the 15th. Reviewed this is not a D and C, but a visit in the office where they will discuss the procedure and then schedule the procedure. For logistical reasons Jennifer [name] would like a D and C sooner. Reviewed her beta did drop, but not at the rate that is expected.  US  ordered. Instructed pt to call the front desk and see if she can see a MD sooner so that she can have a D and C sooner. Discussed she can consider [facility] complex family planning if needed.  Jennifer [name], CNM  [facility] 10/18/23  2:05 PM

## 2023-10-18 NOTE — Telephone Encounter (Signed)
 Hello Jennifer [name], I have spoken with [place] today.  I gave her the telephone number to Centralized Scheduling for the Ultrasound.  She also mention that you wanted to see if we could move up her appointment that she has on the 15th.  That is the earliest I have, but that was with Dr. Starla.  I am unsure if she will be back in office by then.  I won't know until Thursday of this week. There is nothing available with Dr. Leigh either.  Please give the patient a call to advise her of the next steps.  Charlene

## 2023-10-18 NOTE — Telephone Encounter (Signed)
 Pt completed lab appt on 10/17/2023 in the afternoon.

## 2023-10-19 ENCOUNTER — Other Ambulatory Visit: Payer: Self-pay | Admitting: Licensed Practical Nurse

## 2023-10-19 ENCOUNTER — Ambulatory Visit
Admission: RE | Admit: 2023-10-19 | Discharge: 2023-10-19 | Disposition: A | Discharge status: Home / Self Care | Source: Ambulatory Visit | Attending: Licensed Practical Nurse | Admitting: Licensed Practical Nurse

## 2023-10-19 DIAGNOSIS — O034 Incomplete spontaneous abortion without complication: Secondary | ICD-10-CM | POA: Insufficient documentation

## 2023-10-26 ENCOUNTER — Encounter: Admitting: Obstetrics & Gynecology

## 2023-10-27 ENCOUNTER — Ambulatory Visit: Payer: Self-pay | Admitting: Licensed Practical Nurse

## 2023-10-28 NOTE — Progress Notes (Deleted)
 @[facility] @   GYNECOLOGY PREOPERATIVE HISTORY AND PHYSICAL   Subjective:  Jennifer [name] is a 28 y.o. [phone] here for surgical management of Incomplete Abortion. Indications for procedure include: ***. No significant preoperative concerns.  Proposed surgery: Dilation and Curettage     Pertinent Gynecological History: Menses: {menses:16152} Bleeding: {uterine bleeding:32112} Contraception: none Last mammogram: Not age appropriate  Last pap: normal Date: 08/19/2021   Past Medical History:  Diagnosis Date   ADHD (attention deficit hyperactivity disorder), combined type 10/27/2017   by history/ Alan Hail LCSW   Adjustment disorder with mixed anxiety and depressed mood 10/27/2017   Alan Hail LCSW   Anemia    Anxiety    Environmental and seasonal allergies    Lost custody of children 02/28/2018   Loss custody of 2 children/per Almarie Hillier CNM   Past Surgical History:  Procedure Laterality Date   WISDOM TOOTH EXTRACTION     age 6 - all four   OB History  Gravida Para Term Preterm AB Living  6 4 4  1 4   SAB IAB Ectopic Multiple Live Births  0 1  0 4    # Outcome Date GA Lbr Len/2nd Weight Sex Type Anes PTL Lv  6 Gravida           5 Term 04/27/22   8 lb 2 oz (3.685 kg) F Vag-Spont   LIV  4 Term 11/30/19 [redacted]w[redacted]d / 00:09 8 lb 7.8 oz (3.85 kg) F Vag-Spont EPI  LIV     Birth Comments: [id number]  3 IAB 10/2017          2 Term 08/27/17 [redacted]w[redacted]d 11:23 / 00:05 8 lb 11.3 oz (3.95 kg) F Vag-Spont EPI  LIV  1 Term 09/03/15 [redacted]w[redacted]d / 00:24 7 lb 7.2 oz (3.38 kg) M Vag-Spont Gen  LIV    Obstetric Comments  IOL for postdates with G1  Patient denies any other pertinent gynecologic issues.  Family History  Problem Relation Age of Onset   Hypertension Maternal Grandmother    Breast cancer Other        unknown age   Cancer Other 8       lung   Social History   Socioeconomic History   Marital status: Single    Spouse name: Not on file   Number of children: 3   Years of  education: 87   Highest education level: Not on file  Occupational History   Occupation: stay at home mom  Tobacco Use   Smoking status: Former    Current packs/day: 0.00    Types: Cigarettes    Quit date: 04/28/2015    Years since quitting: 8.5   Smokeless tobacco: Never   Tobacco comments:    Not since 2021  Vaping Use   Vaping status: Former  Substance and Sexual Activity   Alcohol use: Not Currently   Drug use: Yes    Types: Marijuana   Sexual activity: Yes    Partners: Male    Birth control/protection: None  Other Topics Concern   Not on file  Social History Narrative   Not on file   Social Drivers of Health   Financial Resource Strain: Low Risk  (09/16/2021)   Overall Financial Resource Strain (CARDIA)    Difficulty of Paying Living Expenses: Not hard at all  Food Insecurity: No Food Insecurity (04/27/2022)   Hunger Vital Sign    Worried About Running Out of Food in the Last Year: Never true    Ran Out  of Food in the Last Year: Never true  Transportation Needs: No Transportation Needs (04/27/2022)   PRAPARE - Administrator, Civil Service (Medical): No    Lack of Transportation (Non-Medical): No  Physical Activity: Insufficiently Active (09/16/2021)   Exercise Vital Sign    Days of Exercise per Week: 2 days    Minutes of Exercise per Session: 60 min  Stress: No Stress Concern Present (09/16/2021)   [employer] of Occupational Health - Occupational Stress Questionnaire    Feeling of Stress : Not at all  Social Connections: Moderately Isolated (09/16/2021)   Social Connection and Isolation Panel    Frequency of Communication with Friends and Family: More than three times a week    Frequency of Social Gatherings with Friends and Family: Once a week    Attends Religious Services: Never    Database administrator or Organizations: No    Attends Banker Meetings: Never    Marital Status: Living with partner  Intimate Partner Violence: Not At  Risk (02/05/2022)   Humiliation, Afraid, Rape, and Kick questionnaire    Fear of Current or Ex-Partner: No    Emotionally Abused: No    Physically Abused: No    Sexually Abused: No   Current Outpatient Medications on File Prior to Visit  Medication Sig Dispense Refill   fluticasone (FLONASE) 50 MCG/ACT nasal spray Place 2 sprays into the nose.     lidocaine  (XYLOCAINE ) 2 % solution Use as directed 15 mLs in the mouth or throat every 4 (four) hours as needed. (Patient not taking: Reported on 08/29/2023) 100 mL 0   metroNIDAZOLE  (FLAGYL ) 500 MG tablet Take 1 tablet (500 mg total) by mouth 2 (two) times daily. (Patient not taking: Reported on 08/29/2023) 14 tablet 0   Norethindrone Acetate-Ethinyl Estrad-FE (HAILEY  24 FE) 1-20 MG-MCG(24) tablet Take 1 tablet by mouth daily. 84 tablet 3   predniSONE  (STERAPRED UNI-PAK 21 TAB) 10 MG (21) TBPK tablet Take by mouth daily. Take 6 tabs by mouth daily  for 1 days, then 5 tabs for 1 days, then 4 tabs for 1 days, then 3 tabs for 1 days, 2 tabs for 1 days, then 1 tab by mouth daily for 1 days (Patient not taking: Reported on 08/29/2023) 21 tablet 0   No current facility-administered medications on file prior to visit.   Allergies  Allergen Reactions   Hydrocodone Hives and Itching   Vicodin [Hydrocodone-Acetaminophen ] Itching   Fruit Extracts Other (See Comments)    Pt states that she is allergic to all fruits including avocado's and fruits with seeds. The reaction changes the appearance of her tongue to a red, cracked appearance      Review of Systems Constitutional: No recent fever/chills/sweats Respiratory: No recent cough/bronchitis Cardiovascular: No chest pain Gastrointestinal: No recent nausea/vomiting/diarrhea Genitourinary: No UTI symptoms Hematologic/lymphatic:No history of coagulopathy or recent blood thinner use    Objective:   not currently breastfeeding. CONSTITUTIONAL: Well-developed, well-nourished female in no acute distress.   HENT:  Normocephalic, atraumatic, External right and left ear normal. Oropharynx is clear and moist EYES: Conjunctivae and EOM are normal. Pupils are equal, round, and reactive to light. No scleral icterus.  NECK: Normal range of motion, supple, no masses SKIN: Skin is warm and dry. No rash noted. Not diaphoretic. No erythema. No pallor. NEUROLOGIC: Alert and oriented to person, place, and time. Normal reflexes, muscle tone coordination. No cranial nerve deficit noted. PSYCHIATRIC: Normal mood and affect. Normal behavior. Normal judgment  and thought content. CARDIOVASCULAR: Normal heart rate noted, regular rhythm RESPIRATORY: Effort and breath sounds normal, no problems with respiration noted ABDOMEN: Soft, nontender, nondistended. PELVIC: Deferred MUSCULOSKELETAL: Normal range of motion. No edema and no tenderness. 2+ distal pulses.    Labs: Results for orders placed or performed in visit on 10/17/23 (from the past 2 weeks)  Beta hCG quant (ref lab)   Collection Time: 10/17/23  3:18 PM  Result Value Ref Range   hCG Quant 58,706 mIU/mL     Imaging Studies: US  OB Transvaginal Result Date: 10/25/2023 CLINICAL DATA:  Incomplete abortion. EXAM: TRANSVAGINAL OB ULTRASOUND TECHNIQUE: Transvaginal ultrasound was performed for complete evaluation of the gestation as well as the maternal uterus, adnexal regions, and pelvic cul-de-sac. COMPARISON:  None Available. FINDINGS: Intrauterine gestational sac: Single Yolk sac:  Visualized. Embryo:  Visualized. Cardiac Activity: Not Visualized. CRL:   21 mm   8 w 5 d                  US  EDC: 05/25/2024 Subchorionic hemorrhage:  Small subchorionic hemorrhage. Maternal uterus/adnexae: Ovaries are normal in appearance. No mass or abnormal free fluid identified. IMPRESSION: Findings meet definitive criteria for failed pregnancy. This follows SRU consensus guidelines: Diagnostic Criteria for Nonviable Pregnancy Early in the First Trimester. [facility]  [phone]. Electronically Signed   By: Norleen DELENA Kil M.D.   On: 10/25/2023 14:55    Assessment:     ***      Plan:    Counseling: Procedure, risks, reasons, benefits and complications (including injury to bowel, bladder, major blood vessel, ureter, bleeding, possibility of transfusion, infection, or fistula formation) reviewed in detail. Likelihood of success in alleviating the patient's condition was discussed. Routine postoperative instructions will be reviewed with the patient and her family in detail after surgery.  The patient concurred with the proposed plan, giving informed written consent for the surgery.   Preop testing ordered. Instructions reviewed, including NPO after midnight.      Harland Birkenhead, MD [facility]

## 2023-10-31 ENCOUNTER — Encounter: Admitting: Obstetrics & Gynecology

## 2023-10-31 ENCOUNTER — Other Ambulatory Visit: Payer: Self-pay

## 2023-10-31 DIAGNOSIS — O034 Incomplete spontaneous abortion without complication: Secondary | ICD-10-CM

## 2023-11-15 ENCOUNTER — Ambulatory Visit (INDEPENDENT_AMBULATORY_CARE_PROVIDER_SITE_OTHER): Admitting: Obstetrics & Gynecology

## 2023-11-15 VITALS — BP 97/59 | HR 92 | Ht 62.0 in | Wt 187.9 lb

## 2023-11-15 DIAGNOSIS — O034 Incomplete spontaneous abortion without complication: Secondary | ICD-10-CM

## 2023-11-15 NOTE — Progress Notes (Unsigned)
    GYNECOLOGY PROGRESS NOTE  Subjective:    Patient ID: Jennifer [name], female    DOB: [birth year], 28 y.o.   MRN: [id number]  HPI  Patient is a 28 y.o. married [id number] here to discuss her incomplete ab. She was given this diagnosis based on a TVS on 10/25/2023. Since that time she started to bleed, like a period today. Her QBHCG was 58,000 4 weeks ago.   The following portions of the patient's history were reviewed and updated as appropriate: allergies, current medications, past family history, past medical history, past social history, past surgical history, and problem list.  Review of Systems Pertinent items are noted in HPI.   Objective:   Blood pressure (!) 97/59, pulse 92, height 5' 2 (1.575 m), weight 187 lb 14.4 oz (85.2 kg), last menstrual period 11/13/2023, not currently breastfeeding. Body mass index is 34.37 kg/m. Well nourished, well hydrated White female, no apparent distress She is ambulating and conversing normally.    Assessment:   1. Incomplete abortion      Plan:   1. Incomplete abortion (Primary)  - B-HCG Quant - If her levels are falling normally, then she will need weekly QBHCGs until negative.  - If the level has not fallen, then I will schedule a dilation and curettage.

## 2023-11-16 ENCOUNTER — Encounter: Payer: Self-pay | Admitting: Obstetrics & Gynecology

## 2023-11-16 LAB — BETA HCG QUANT (REF LAB): hCG Quant: 660 m[IU]/mL

## 2023-11-27 ENCOUNTER — Emergency Department
Admission: EM | Admit: 2023-11-27 | Discharge: 2023-11-27 | Disposition: A | Discharge status: Home / Self Care | Attending: Emergency Medicine | Admitting: Emergency Medicine

## 2023-11-27 ENCOUNTER — Other Ambulatory Visit: Payer: Self-pay

## 2023-11-27 DIAGNOSIS — G43009 Migraine without aura, not intractable, without status migrainosus: Secondary | ICD-10-CM | POA: Insufficient documentation

## 2023-11-27 DIAGNOSIS — G43909 Migraine, unspecified, not intractable, without status migrainosus: Secondary | ICD-10-CM | POA: Diagnosis present

## 2023-11-27 LAB — HCG, QUANTITATIVE, PREGNANCY: hCG, Beta Chain, Quant, S: 161 m[IU]/mL — ABNORMAL HIGH (ref <5)

## 2023-11-27 MED ORDER — METOCLOPRAMIDE HCL 5 MG/ML IJ SOLN
10.0000 mg | Freq: Once | INTRAMUSCULAR | Status: AC
  Administered 2023-11-27: 10 mg via INTRAVENOUS
  Filled 2023-11-27: qty 2

## 2023-11-27 MED ORDER — SODIUM CHLORIDE 0.9 % IV BOLUS
1000.0000 mL | Freq: Once | INTRAVENOUS | Status: AC
  Administered 2023-11-27: 1000 mL via INTRAVENOUS

## 2023-11-27 MED ORDER — DEXAMETHASONE SOD PHOSPHATE PF 10 MG/ML IJ SOLN
10.0000 mg | Freq: Once | INTRAMUSCULAR | Status: AC
  Administered 2023-11-27: 10 mg via INTRAVENOUS

## 2023-11-27 MED ORDER — DIPHENHYDRAMINE HCL 50 MG/ML IJ SOLN
12.5000 mg | Freq: Once | INTRAMUSCULAR | Status: AC
  Administered 2023-11-27: 12.5 mg via INTRAVENOUS
  Filled 2023-11-27: qty 1

## 2023-11-27 MED ORDER — KETOROLAC TROMETHAMINE 15 MG/ML IJ SOLN
15.0000 mg | Freq: Once | INTRAMUSCULAR | Status: AC
  Administered 2023-11-27: 15 mg via INTRAVENOUS
  Filled 2023-11-27: qty 1

## 2023-11-27 NOTE — ED Provider Notes (Signed)
 [facility] Emergency Department Provider Note     Event Date/Time   First MD Initiated Contact with Patient 11/27/23 1730     (approximate)   History   Migraine   HPI  Jennifer [name] is [name] 28 y.o. female with [name] past medical history of migraines, ADHD, anxiety and anemia presents to the ED for evaluation of unilateral right sided migraine intermittently ongoing for 2 weeks.  Patient reports that home medication has not been working.  She denies vision changes, vomiting or any other symptoms outside of her usual migraines.  Denies injury.  No other complaint at this time.  Chart reviewed - Patient seen by neurology on 11/06 for initial consult on headache management.  Prescribed nortriptyline 10 mg for headache prevention and naratriptan for acute headache management.     Physical Exam   Triage Vital Signs: ED Triage Vitals  Encounter Vitals Group     BP 11/27/23 1714 121/81     Girls Systolic BP Percentile --      Girls Diastolic BP Percentile --      Boys Systolic BP Percentile --      Boys Diastolic BP Percentile --      Pulse Rate 11/27/23 1714 100     Resp 11/27/23 1714 18     Temp 11/27/23 1714 98 F (36.7 C)     Temp src --      SpO2 11/27/23 1714 100 %     Weight 11/27/23 1713 180 lb (81.6 kg)     Height 11/27/23 1713 5' 2 (1.575 m)     Head Circumference --      Peak Flow --      Pain Score 11/27/23 1712 10     Pain Loc --      Pain Education --      Exclude from Growth Chart --     Most recent vital signs: Vitals:   11/27/23 1714 11/27/23 2101  BP: 121/81   Pulse: 100 74  Resp: 18 18  Temp: 98 F (36.7 C)   SpO2: 100% 100%   General Awake, no distress.  Well-appearing HEENT NCAT. PERRL. EOMI.  CV:  Good peripheral perfusion.  RESP:  Normal effort.  ABD:  No distention.  NEURO: GCS 15. Cranial nerves II-XII intact. No focal deficits. Speech is clear. Sensation and motor function intact. Normal muscle strength of UE &  LE. Gait is steady.   ED Results / Procedures / Treatments   Labs (all labs ordered are listed, but only abnormal results are displayed) Labs Reviewed  HCG, QUANTITATIVE, PREGNANCY - Abnormal; Notable for the following components:      Result Value   hCG, Beta Chain, Quant, S 161 (*)    All other components within normal limits    No results found.  PROCEDURES:  Critical Care performed: No  Procedures  MEDICATIONS ORDERED IN ED: Medications  ketorolac  (TORADOL ) 15 MG/ML injection 15 mg (15 mg Intravenous Given 11/27/23 1802)  sodium chloride  0.9 % bolus 1,000 mL (0 mLs Intravenous Stopped 11/27/23 2006)  diphenhydrAMINE  (BENADRYL ) injection 12.5 mg (12.5 mg Intravenous Given 11/27/23 1803)  dexamethasone  (DECADRON ) injection 10 mg (10 mg Intravenous Given 11/27/23 1803)  metoCLOPramide  (REGLAN ) injection 10 mg (10 mg Intravenous Given 11/27/23 1802)  ketorolac  (TORADOL ) 15 MG/ML injection 15 mg (15 mg Intravenous Given 11/27/23 2057)     IMPRESSION / MDM / ASSESSMENT AND PLAN / ED COURSE  I reviewed the triage vital signs  and the nursing notes.                             Clinical Course as of 11/27/23 2217  [name] Nov 27, 2023  1900 On reassessment patient states the migraine is wearing off.  [MH]  2011 Per chart review patient recently seen in office for incomplete abortion.  Will obtain hCG to evaluate decline of value before administering second dose of Toradol .  Previous hcg value 660 [MH]  2034 Patient is ready to be discharged.  She does not want to wait for hCG results which is reasonable. Patient stable condition for discharge home. [MH]  2053 hCG, quantitative, pregnancy(!) 161 [MH]    Clinical Course User Index [MH] Jennifer [name], Zoltan Genest A, PA-C   28 y.o. female presents to the emergency department for evaluation and treatment of Migraine. See HPI for further details.   Differential diagnosis includes, but is not limited to migraine, tension headache, and  non-specific headache.   Patient's presentation is most consistent with acute, uncomplicated illness.  Patient is alert and oriented.  She is hemodynamically stable.  Physical exam findings are stated above.  Normal neuroexam.  No indication for imaging at this time.  Do suspect presentation is consistent with patient history of common migraine.  Migraine cocktail administered.  Patient reports symptoms of improved.  She is in stable satisfactory condition for discharge home and outpatient management as needed.  ED return precaution discussed.  FINAL CLINICAL IMPRESSION(S) / ED DIAGNOSES   Final diagnoses:  Migraine without aura and without status migrainosus, not intractable   Rx / DC Orders   ED Discharge Orders     None      Note:  This document was prepared using Dragon voice recognition software and may include unintentional dictation errors.    Jennifer [name], Jennifer Minks A, PA-C 11/27/23 2218    Jennifer Lorelle Cummins, MD 12/05/23 [phone]

## 2023-11-27 NOTE — ED Triage Notes (Signed)
 Pt comes with migraine for two weeks. Pt states hx of this and see a neurologist. Pt is also on meds for it but it hasn't really helped.

## 2023-11-27 NOTE — Discharge Instructions (Signed)
 Follow up with your neurologist for further management

## 2024-02-07 ENCOUNTER — Ambulatory Visit

## 2024-02-09 ENCOUNTER — Ambulatory Visit

## 2024-02-09 VITALS — BP 104/68 | HR 108 | Ht 62.0 in | Wt 187.0 lb

## 2024-02-09 DIAGNOSIS — O034 Incomplete spontaneous abortion without complication: Secondary | ICD-10-CM

## 2024-02-09 DIAGNOSIS — Z3200 Encounter for pregnancy test, result unknown: Secondary | ICD-10-CM

## 2024-02-09 LAB — POCT URINE PREGNANCY: Preg Test, Ur: POSITIVE — AB

## 2024-02-09 NOTE — Progress Notes (Signed)
" ° ° °  NURSE VISIT NOTE  Subjective:    Patient ID: Jennifer [name], female    DOB: [birth year], 29 y.o.   MRN: [id number]  HPI  Patient is a 29 y.o. [id number] female who presents for evaluation of amenorrhea. She believes she could be pregnant. Patient is ambivalent about pregnancy. Sexual Activity: single partner, contraception: none. Current symptoms also include: nausea. Last period was abnormal. 3 days of light spotting only when wiped after using the restroom.    Objective:    BP 104/68   Pulse (!) 108   Ht 5' 2 (1.575 m)   Wt 187 lb (84.8 kg)   LMP 01/23/2024   BMI 34.20 kg/m   Lab Review  Results for orders placed or performed in visit on 02/09/24  POCT urine pregnancy  Result Value Ref Range   Preg Test, Ur Positive (A) Negative    Assessment:   1. Possible pregnancy   2. Incomplete abortion     Plan:  Pt Had a faint positive test. Pt recently had abortion in November. Pt ordered abortion pills online. Took pills on 10/14/23.Did not follow up with Betas, will send to do blood work to see if this is a new pregnancy or the remaninig of recent pregnancy. Last beta her level was 161 on 11/27/23. Pt aware the DR will let her know next steps.   Pregnancy Test: Positive  Estimated Date of Delivery: None noted. BP Cuff Measurement taken. Cuff Size Adult Small Encouraged well-balanced diet, plenty of rest when needed, pre-natal vitamins daily and walking for exercise.  Discussed self-help for nausea, avoiding OTC medications until consulting provider or pharmacist, other than Tylenol  as needed, minimal caffeine  (1-2 cups daily) and avoiding alcohol.   She will schedule her nurse visit @ 7-[redacted] wks pregnant, u/s for dating @10  wk, and NOB visit at [redacted] wk pregnant.    Feel free to call with any questions.     Harlene Gander, CMA   "

## 2024-02-10 LAB — BETA HCG QUANT (REF LAB): hCG Quant: 35 m[IU]/mL

## 2024-02-16 ENCOUNTER — Ambulatory Visit: Payer: Self-pay | Admitting: Obstetrics

## 2024-02-17 NOTE — Progress Notes (Unsigned)
 New OB Intake  I connected with  Jennifer [name] on 02/17/24 at  1:15 PM EST by {Contact:24193} Video Visit and verified that I am speaking with the correct person using two identifiers. Nurse is located at [facility] and pt is located at [facility]  I discussed the limitations, risks, security and privacy concerns of performing an evaluation and management service by telephone and the availability of in person appointments. I also discussed with the patient that there may be a patient responsible charge related to this service. The patient expressed understanding and agreed to proceed.  I explained I am completing New OB Intake today. We discussed her EDD of *** that is based on LMP of ***. Pt is G***/P***. I reviewed her allergies, medications, Medical/Surgical/OB history, and appropriate screenings. There are cats in the home: {yes/no:63}. If yes: {Desc; indoor/outdoor:13239}. Based on history, this is a/an pregnancy {Complicated/Uncomplicated Pregnancy:20185} . Her obstetrical history is significant for {ob risk factors:10154}.  Patient Active Problem List   Diagnosis Date Noted   Seasonal allergies 06/01/2021   Tobacco use during pregnancy, antepartum 03/07/2018   Iron  deficiency anemia 07/05/2017   Rh negative state in antepartum period 05/09/2017   Marijuana use 03/18/2017   ADHD (attention deficit hyperactivity disorder) 11/20/2013   Allergic rhinitis 11/20/2013    Concerns addressed today:   Delivery Plans:  Plans to deliver at [facility].  Anatomy US  Explained first scheduled US  will be ***. Anatomy US  will be scheduled around [redacted] weeks gestational age.  Labs Discussed genetic screening with patient. Patient *** genetic testing to be drawn at new OB visit. Discussed possible labs to be drawn at new OB appointment.  COVID Vaccine Patient {HAS/HAS NOT:20194} had COVID vaccine.   Social Determinants of Health Food Insecurity: {qnni7:72521} WIC Referral: Patient  {ACTION; IS/IS WNU:[id number]} interested in referral to [facility].  Transportation: {transportation:25542} Childcare: Discussed no children allowed at ultrasound appointments.   First visit review I reviewed new OB appt with pt. I explained she will have blood work and pap smear/pelvic exam if indicated. Explained pt will be seen by *** at first visit; encounter routed to appropriate provider.   Camelia LITTIE Fetters, CMA 02/17/2024  5:04 PM

## 2024-02-17 NOTE — Telephone Encounter (Signed)
 Called Jennifer [name] to discuss bhCG results. She reports that she believes she had a miscarriage yesterday. She had heavy bleeding and clots. Today, she is having just light bleeding when she wipes. Instructed to seek medical attention if she has heavy bleeding or severe pain. Will schedule f/u visit for hcg.  Grizelda Piscopo M Taggart Prasad, CNM

## 2024-02-20 ENCOUNTER — Other Ambulatory Visit

## 2024-02-20 ENCOUNTER — Ambulatory Visit: Admitting: Obstetrics

## 2024-02-20 ENCOUNTER — Telehealth

## 2024-02-29 ENCOUNTER — Other Ambulatory Visit

## 2024-03-14 ENCOUNTER — Encounter: Admitting: Certified Nurse Midwife
